# Patient Record
Sex: Female | Born: 1935 | Race: White | Hispanic: No | Marital: Married | Smoking: Never smoker
Health system: Southern US, Community
[De-identification: ages and names within clinical notes are randomized; demographics above are authoritative.]

## PROBLEM LIST (undated history)

## (undated) DIAGNOSIS — E785 Hyperlipidemia, unspecified: Secondary | ICD-10-CM

## (undated) DIAGNOSIS — M1990 Unspecified osteoarthritis, unspecified site: Secondary | ICD-10-CM

## (undated) DIAGNOSIS — J449 Chronic obstructive pulmonary disease, unspecified: Secondary | ICD-10-CM

## (undated) DIAGNOSIS — G43909 Migraine, unspecified, not intractable, without status migrainosus: Secondary | ICD-10-CM

## (undated) DIAGNOSIS — M790 Rheumatism, unspecified: Secondary | ICD-10-CM

## (undated) DIAGNOSIS — A310 Pulmonary mycobacterial infection: Secondary | ICD-10-CM

## (undated) HISTORY — PX: TONSILLECTOMY: SUR1361

## (undated) HISTORY — DX: Rheumatism, unspecified: M79.0

## (undated) HISTORY — DX: Hyperlipidemia, unspecified: E78.5

## (undated) HISTORY — PX: VESICOVAGINAL FISTULA CLOSURE W/ TAH: SUR271

## (undated) HISTORY — PX: CYSTOCELE REPAIR: SHX163

---

## 1998-07-24 ENCOUNTER — Encounter: Payer: Self-pay | Admitting: Pulmonary Disease

## 1998-07-31 ENCOUNTER — Ambulatory Visit (HOSPITAL_COMMUNITY): Admission: RE | Admit: 1998-07-31 | Discharge: 1998-07-31 | Payer: Self-pay | Admitting: Pulmonary Disease

## 1998-07-31 ENCOUNTER — Encounter: Payer: Self-pay | Admitting: Pulmonary Disease

## 1998-08-29 ENCOUNTER — Encounter: Payer: Self-pay | Admitting: Pulmonary Disease

## 1999-01-08 ENCOUNTER — Encounter: Admission: RE | Admit: 1999-01-08 | Discharge: 1999-04-08 | Payer: Self-pay | Admitting: Orthopedic Surgery

## 1999-03-19 ENCOUNTER — Encounter: Payer: Self-pay | Admitting: Pulmonary Disease

## 1999-03-19 ENCOUNTER — Encounter: Admission: RE | Admit: 1999-03-19 | Discharge: 1999-03-19 | Payer: Self-pay | Admitting: Pulmonary Disease

## 1999-03-25 ENCOUNTER — Encounter: Payer: Self-pay | Admitting: Pulmonary Disease

## 1999-05-01 ENCOUNTER — Other Ambulatory Visit: Admission: RE | Admit: 1999-05-01 | Discharge: 1999-05-01 | Payer: Self-pay | Admitting: Obstetrics and Gynecology

## 1999-06-15 ENCOUNTER — Encounter (INDEPENDENT_AMBULATORY_CARE_PROVIDER_SITE_OTHER): Payer: Self-pay

## 1999-06-15 ENCOUNTER — Inpatient Hospital Stay (HOSPITAL_COMMUNITY): Admission: RE | Admit: 1999-06-15 | Discharge: 1999-06-17 | Payer: Self-pay | Admitting: Obstetrics and Gynecology

## 1999-08-24 ENCOUNTER — Encounter: Payer: Self-pay | Admitting: Pulmonary Disease

## 2000-08-01 ENCOUNTER — Encounter: Admission: RE | Admit: 2000-08-01 | Discharge: 2000-08-01 | Payer: Self-pay | Admitting: Geriatric Medicine

## 2000-08-01 ENCOUNTER — Encounter: Payer: Self-pay | Admitting: Geriatric Medicine

## 2000-08-09 ENCOUNTER — Ambulatory Visit (HOSPITAL_COMMUNITY): Admission: RE | Admit: 2000-08-09 | Discharge: 2000-08-09 | Payer: Self-pay | Admitting: Geriatric Medicine

## 2001-06-05 ENCOUNTER — Ambulatory Visit (HOSPITAL_COMMUNITY): Admission: RE | Admit: 2001-06-05 | Discharge: 2001-06-05 | Payer: Self-pay | Admitting: Gastroenterology

## 2001-08-11 ENCOUNTER — Ambulatory Visit (HOSPITAL_COMMUNITY): Admission: RE | Admit: 2001-08-11 | Discharge: 2001-08-11 | Payer: Self-pay | Admitting: Geriatric Medicine

## 2001-09-13 ENCOUNTER — Ambulatory Visit: Admission: RE | Admit: 2001-09-13 | Discharge: 2001-09-13 | Payer: Self-pay | Admitting: Pulmonary Disease

## 2002-08-06 ENCOUNTER — Other Ambulatory Visit: Admission: RE | Admit: 2002-08-06 | Discharge: 2002-08-06 | Payer: Self-pay | Admitting: Geriatric Medicine

## 2002-09-27 ENCOUNTER — Encounter: Admission: RE | Admit: 2002-09-27 | Discharge: 2002-09-27 | Payer: Self-pay | Admitting: Geriatric Medicine

## 2002-09-27 ENCOUNTER — Encounter: Payer: Self-pay | Admitting: Pulmonary Disease

## 2002-09-27 ENCOUNTER — Encounter: Payer: Self-pay | Admitting: Geriatric Medicine

## 2002-10-05 ENCOUNTER — Encounter (INDEPENDENT_AMBULATORY_CARE_PROVIDER_SITE_OTHER): Payer: Self-pay | Admitting: Specialist

## 2002-10-05 ENCOUNTER — Ambulatory Visit: Admission: RE | Admit: 2002-10-05 | Discharge: 2002-10-05 | Payer: Self-pay | Admitting: Pulmonary Disease

## 2004-06-01 ENCOUNTER — Ambulatory Visit: Payer: Self-pay | Admitting: Pulmonary Disease

## 2004-12-17 ENCOUNTER — Ambulatory Visit: Payer: Self-pay | Admitting: Pulmonary Disease

## 2005-03-17 ENCOUNTER — Ambulatory Visit: Payer: Self-pay | Admitting: Pulmonary Disease

## 2005-06-17 ENCOUNTER — Ambulatory Visit: Payer: Self-pay | Admitting: Pulmonary Disease

## 2006-03-08 ENCOUNTER — Ambulatory Visit: Payer: Self-pay | Admitting: Pulmonary Disease

## 2006-07-04 ENCOUNTER — Ambulatory Visit: Payer: Self-pay | Admitting: Pulmonary Disease

## 2007-02-21 ENCOUNTER — Ambulatory Visit: Payer: Self-pay | Admitting: Pulmonary Disease

## 2007-03-25 DIAGNOSIS — K449 Diaphragmatic hernia without obstruction or gangrene: Secondary | ICD-10-CM | POA: Insufficient documentation

## 2007-03-25 DIAGNOSIS — E785 Hyperlipidemia, unspecified: Secondary | ICD-10-CM

## 2007-03-25 DIAGNOSIS — J471 Bronchiectasis with (acute) exacerbation: Secondary | ICD-10-CM | POA: Insufficient documentation

## 2007-06-09 ENCOUNTER — Ambulatory Visit: Payer: Self-pay | Admitting: Pulmonary Disease

## 2007-08-25 ENCOUNTER — Encounter: Admission: RE | Admit: 2007-08-25 | Discharge: 2007-08-25 | Payer: Self-pay | Admitting: Geriatric Medicine

## 2008-03-27 ENCOUNTER — Encounter: Admission: RE | Admit: 2008-03-27 | Discharge: 2008-03-27 | Payer: Self-pay | Admitting: Otolaryngology

## 2008-06-07 ENCOUNTER — Ambulatory Visit: Payer: Self-pay | Admitting: Pulmonary Disease

## 2008-06-07 DIAGNOSIS — J329 Chronic sinusitis, unspecified: Secondary | ICD-10-CM | POA: Insufficient documentation

## 2008-06-07 DIAGNOSIS — J309 Allergic rhinitis, unspecified: Secondary | ICD-10-CM | POA: Insufficient documentation

## 2008-06-07 DIAGNOSIS — A310 Pulmonary mycobacterial infection: Secondary | ICD-10-CM

## 2008-06-07 DIAGNOSIS — J449 Chronic obstructive pulmonary disease, unspecified: Secondary | ICD-10-CM

## 2008-10-09 ENCOUNTER — Ambulatory Visit: Payer: Self-pay | Admitting: Pulmonary Disease

## 2008-10-15 ENCOUNTER — Ambulatory Visit: Payer: Self-pay | Admitting: Pulmonary Disease

## 2008-10-15 ENCOUNTER — Ambulatory Visit: Payer: Self-pay | Admitting: Internal Medicine

## 2008-10-15 ENCOUNTER — Telehealth (INDEPENDENT_AMBULATORY_CARE_PROVIDER_SITE_OTHER): Payer: Self-pay | Admitting: *Deleted

## 2008-10-15 DIAGNOSIS — R042 Hemoptysis: Secondary | ICD-10-CM | POA: Insufficient documentation

## 2008-10-18 ENCOUNTER — Telehealth (INDEPENDENT_AMBULATORY_CARE_PROVIDER_SITE_OTHER): Payer: Self-pay | Admitting: *Deleted

## 2008-10-24 ENCOUNTER — Ambulatory Visit: Payer: Self-pay | Admitting: Pulmonary Disease

## 2009-02-21 ENCOUNTER — Ambulatory Visit: Payer: Self-pay | Admitting: Pulmonary Disease

## 2009-08-29 ENCOUNTER — Ambulatory Visit: Payer: Self-pay | Admitting: Pulmonary Disease

## 2010-02-25 ENCOUNTER — Ambulatory Visit: Payer: Self-pay | Admitting: Pulmonary Disease

## 2010-03-10 ENCOUNTER — Telehealth (INDEPENDENT_AMBULATORY_CARE_PROVIDER_SITE_OTHER): Payer: Self-pay | Admitting: *Deleted

## 2010-03-10 ENCOUNTER — Ambulatory Visit: Payer: Self-pay | Admitting: Internal Medicine

## 2010-03-30 ENCOUNTER — Telehealth (INDEPENDENT_AMBULATORY_CARE_PROVIDER_SITE_OTHER): Payer: Self-pay | Admitting: *Deleted

## 2010-03-31 ENCOUNTER — Ambulatory Visit: Payer: Self-pay | Admitting: Pulmonary Disease

## 2010-04-01 ENCOUNTER — Encounter: Payer: Self-pay | Admitting: Pulmonary Disease

## 2010-04-01 DIAGNOSIS — R042 Hemoptysis: Secondary | ICD-10-CM | POA: Insufficient documentation

## 2010-04-01 DIAGNOSIS — J159 Unspecified bacterial pneumonia: Secondary | ICD-10-CM | POA: Insufficient documentation

## 2010-04-13 ENCOUNTER — Ambulatory Visit: Payer: Self-pay | Admitting: Pulmonary Disease

## 2010-04-27 ENCOUNTER — Encounter: Payer: Self-pay | Admitting: Pulmonary Disease

## 2010-04-30 ENCOUNTER — Encounter: Payer: Self-pay | Admitting: Pulmonary Disease

## 2010-05-01 ENCOUNTER — Encounter: Payer: Self-pay | Admitting: Pulmonary Disease

## 2010-05-08 ENCOUNTER — Telehealth (INDEPENDENT_AMBULATORY_CARE_PROVIDER_SITE_OTHER): Payer: Self-pay | Admitting: *Deleted

## 2010-06-08 ENCOUNTER — Encounter: Payer: Self-pay | Admitting: Pulmonary Disease

## 2010-06-23 NOTE — Assessment & Plan Note (Signed)
Summary: rov for bronchiectasis and hemoptysis   Visit Type:  Follow-up Primary Provider/Referring Provider:  Dr. Pete Glatter  CC:  2 week f/u. pt states she has a little cough 100% better. pt states phlem is light yellow. pt states her breahting has been "good". pt is a never smoker.  History of Present Illness: the pt comes in today for f/u of her known bronchiectasis.  At the last visit, she was treated with abx for an acute exacerbation complicated by hemoptysis.  She comes in for f/u today where her cough has almost totally resolved.  Her mucus is clearing as well, and has not had any further hemoptysis.  She feels that she is breathing well.  Her sputum culture from last visit showed normal flora, but the afb smears were positive with culture pending.  Current Medications (verified): 1)  Lipitor 10 Mg  Tabs (Atorvastatin Calcium) .... Take 1 Tablet By Mouth Once A Day 2)  Anti-Oxidant   Tabs (Multiple Vitamin) .... Once Daily 3)  Calcium Carbonate-Vitamin D 600-400 Mg-Unit  Tabs (Calcium Carbonate-Vitamin D) .... Take 1 Tablet By Mouth Two Times A Day 4)  Vitamin C 500 Mg  Tabs (Ascorbic Acid) .... Take 1 Tablet By Mouth Once A Day 5)  Proair Hfa 108 (90 Base) Mcg/act  Aers (Albuterol Sulfate) .... 2 Puffs Every 4-6 Hours As Needed 6)  Mirtazapine 15 Mg Tabs (Mirtazapine) .... Take 1 Tab By Mouth At Bedtime 7)  Flutter Valve .... Use As Directed 8)  Actonel (? Strength) .Marland Kitchen.. 1 Every Week  Allergies (verified): 1)  ! * Tequin  Review of Systems       The patient complains of shortness of breath with activity, shortness of breath at rest, productive cough, chest pain, difficulty swallowing, and sore throat.  The patient denies non-productive cough, coughing up blood, irregular heartbeats, acid heartburn, indigestion, loss of appetite, weight change, abdominal pain, tooth/dental problems, headaches, nasal congestion/difficulty breathing through nose, sneezing, itching, ear ache, anxiety,  depression, hand/feet swelling, joint stiffness or pain, rash, change in color of mucus, and fever.    Vital Signs:  Patient profile:   77 year old female Height:      64 inches Weight:      104 pounds BMI:     17.92 O2 Sat:      91 % on Room air Temp:     98.1 degrees F oral Pulse rate:   90 / minute BP sitting:   108 / 62  (left arm) Cuff size:   regular  Vitals Entered By: Carver Fila (April 13, 2010 2:50 PM)  O2 Flow:  Room air CC: 2 week f/u. pt states she has a little cough 100% better. pt states phlem is light yellow. pt states her breahting has been "good". pt is a never smoker Comments meds and allergies updated Phone number updated Carver Fila  April 13, 2010 2:51 PM    Physical Exam  General:  thin female in nad Nose:  no purulence or discharge noted. Lungs:  a few scattered crackles and pops, no wheezing Heart:  rrr Extremities:  minimal edema, no cyanosis  Neurologic:  alert and oriented, moves all 4.   Impression & Recommendations:  Problem # 1:  BRONCHIECTASIS (ICD-494.0) the pt is doing much better after a longer course of abx.  She is returning to her usual baseline.  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  I do not think this is an active infection.  Will followup sensitivities.  I have asked her to stay active, keep up with nutrition, and to use proair as needed.  Other Orders: Est. Patient Level III (16109)  Patient Instructions: 1)  continue proair as needed. 2)  let me know if cough or mucus worsens. 3)  will contact you once I know the final results of your MAC culture. 4)  followup with me in 4mos

## 2010-06-23 NOTE — Assessment & Plan Note (Signed)
Summary: Pulmonary/ ext ov for bronchiectasis / hemoptysis > levaquin 750   Primary Provider/Referring Provider:  Dr. Pete Glatter  CC:  Hemoptysis worse x 2 days and tightness in chest and slight increase in SOB w/ exertion.Marland Kitchen  History of Present Illness:  4 yowf never smoker followed by Dr Shelle Iron with bronchiectasis with hemoptysis in setting of MAC infection around 2001 and ever since then freq episodes  low grade hemoptysis  March 10, 2010 ov worse than usual hemoptysis  but still just a few tsp at most with slt chest tightness with ex better with proaire and mucus ? slt more yellow also. Pt denies any significant sore throat, dysphagia, itching, sneezing,  nasal congestion or excess secretions,  fever, chills, sweats, unintended wt loss, pleuritic or exertional cp,   orthopnea pnd or leg swelling, Pt also denies any obvious fluctuation in symptoms with weather or environmental change or other alleviating or aggravating factors.       Current Medications (verified): 1)  Lipitor 10 Mg  Tabs (Atorvastatin Calcium) .... Take 1 Tablet By Mouth Once A Day 2)  Fish Oil 1200 Mg Caps (Omega-3 Fatty Acids) .... Take 1 Tablet By Mouth Once A Day 3)  Anti-Oxidant   Tabs (Multiple Vitamin) .... Once Daily 4)  Calcium Carbonate-Vitamin D 600-400 Mg-Unit  Tabs (Calcium Carbonate-Vitamin D) .... Take 1 Tablet By Mouth Two Times A Day 5)  Vitamin C 500 Mg  Tabs (Ascorbic Acid) .... Take 1 Tablet By Mouth Once A Day 6)  Proair Hfa 108 (90 Base) Mcg/act  Aers (Albuterol Sulfate) .... 2 Puffs Every 4-6 Hours As Needed 7)  Mirtazapine 15 Mg Tabs (Mirtazapine) .... Take 1 Tab By Mouth At Bedtime 8)  Flutter Valve .... Use As Directed 9)  Actonel (? Strength) .Marland Kitchen.. 1 Every Week  Allergies (verified): 1)  ! 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    - Last CT chest 03/19/1999  Vital Signs:  Patient profile:   77 year old female Weight:      102 pounds O2 Sat:      92 % on Room air Temp:     98.4 degrees F oral Pulse rate:   94 / minute BP sitting:   112 / 60  (left arm)  Vitals Entered By: Vernie Murders (March 10, 2010 3:59 PM)  O2 Flow:  Room air  Physical Exam  Additional Exam:  GEN: A/Ox3; pleasant , NAD wt 106 > 102  March 10 2010  HEENT:  [place]/AT, , EACs-clear, TMs-wnl, NOSE-pale mucosa THROAT-clear NECK:  Supple w/ fair ROM; no JVD; normal carotid impulses w/o bruits; no thyromegaly or nodules palpated; no lymphadenopathy. RESP  distant bs, mild increase t exp, no wheeze w/in 4 hours of last B2 CARD:  RRR, no m/r/g   GI:   Soft & nt; nml bowel sounds; no organomegaly or masses detected. [place]: Warm bil,  no calf tenderness edema, clubbing, pulses intact Neuro: intact, no focal deficits.    Impression & Recommendations:  Problem # 1:  HEMOPTYSIS (ICD-786.3)  Her updated medication list for this problem includes:    Anti-oxidant Tabs (Multiple vitamin) ..... Once daily    Calcium Carbonate-vitamin D 600-400 Mg-unit Tabs (Calcium carbonate-vitamin d) .Marland Kitchen... Take 1  tablet by mouth two times a day    Vitamin C 500 Mg Tabs (Ascorbic acid) .Marland Kitchen... Take 1 tablet by mouth once a day    Proair Hfa 108 (90 Base) Mcg/act Aers (Albuterol sulfate) .Marland Kitchen... 2 puffs every 4-6 hours as needed    Levaquin 750 Mg Tabs (Levofloxacin) ..... One tablet daily x 5 days  ? needs cyclical levaquin or consideration for re-bronch and chronic rx of atypcal TB if present  Orders: Est. Patient Level IV (45409)  Problem # 2:  BRONCHIECTASIS (ICD-494.0)   DDX of  difficult airways managment all start with A and  include Adherence, Ace Inhibitors, Acid Reflux, Active Sinus Disease, Alpha 1 Antitripsin deficiency, Anxiety masquerading as Airways dz,  ABPA,   allergy(esp in young), Aspiration (esp in elderly), Adverse effects of DPI,  Active smokers, plus one B  = Beta blocker use..    Acid reflux a concern here as symptoms flared on fish oil p started actonel.  See instructions for specific recommendations   Adherence seems good, I spent extra time with the patient today explaining optimal mdi  technique.  This improved from  50-75% p coaching   ? needs advair or symbicort if becomes more saba depdendent > defer to Dr Shelle Iron  Medications Added to Medication List This Visit: 1)  Actonel (? Strength)  .Marland Kitchen.. 1 every week 2)  Levaquin 750 Mg Tabs (Levofloxacin) .... One tablet daily x 5 days  Other Orders: Prescription Created Electronically [phone])  Patient Instructions: 1)  Levaquin 750 mg one daily x 5 days  2)  Fish oil should be avoided when having active respiratory symptoms because of the risk of reflux of oil into respiratory tract which may be more frequent while on actonel (if this continues off of the fish oil there is an IV form of actonel called reclast which would be eligible for reclast).  Prescriptions: LEVAQUIN 750 MG  TABS (LEVOFLOXACIN) One tablet daily x 5 days  #5 x 0   Entered and Authorized by:   Nyoka Cowden MD   Signed by:   Nyoka Cowden MD on 03/10/2010   Method used:   Electronically to        [facility]* (retail)       [address]       [place], [place]  [id number]       Ph: [phone]       Fax: [phone]   RxID:   [id number]

## 2010-06-23 NOTE — Assessment & Plan Note (Signed)
Summary: acute sick visit for bronchiectasis flare, hemoptysis   Primary Provider/Referring Provider:  Dr. Pete Glatter  CC:  pt c/o cough with brown phlem. Pt states she has globs of blood in it through out the day but it is not bright red, pt c/o 100.6 fever yesterday, a little increased sob, occas wheezing, and chest tightness.  pt is never smoker.  History of Present Illness: the pt comes in today for an acute sick visit.  She has known severe bronchiectasis with h/o MAC, and was seen last month by my partner for an acute exacerbation with worsening hemoptysis.  She was treated with a short course of levaquin with some but not complete improvement.  She comes in today with a worsening hemoptysis, as well as fever to 100.6 yest.  She has noticed some increased sob and chest tightness as well.  Cxr today shows no acute infiltrate, and her underlying chronic changes appear stable.  Current Medications (verified): 1)  Lipitor 10 Mg  Tabs (Atorvastatin Calcium) .... Take 1 Tablet By Mouth Once A Day 2)  Anti-Oxidant   Tabs (Multiple Vitamin) .... Once Daily 3)  Calcium Carbonate-Vitamin D 600-400 Mg-Unit  Tabs (Calcium Carbonate-Vitamin D) .... Take 1 Tablet By Mouth Two Times A Day 4)  Vitamin C 500 Mg  Tabs (Ascorbic Acid) .... Take 1 Tablet By Mouth Once A Day 5)  Proair Hfa 108 (90 Base) Mcg/act  Aers (Albuterol Sulfate) .... 2 Puffs Every 4-6 Hours As Needed 6)  Mirtazapine 15 Mg Tabs (Mirtazapine) .... Take 1 Tab By Mouth At Bedtime 7)  Flutter Valve .... Use As Directed 8)  Actonel (? Strength) .Marland Kitchen.. 1 Every Week  Allergies (verified): 1)  ! * Tequin  Past History:  Past medical, surgical, family and social histories (including risk factors) reviewed, and no changes noted (except as noted below).  Past Medical History: Allergic Rhinitis     - Sinus CT 03/27/2008  complete opac L max sinus, mild thickening elsewhere Hyperlipidemia chronic sinusitis Bronchiectasis, radiographic  diagnosis by ct, with recurrent hemoptysis     - s/p  MAC infection 2000 rx x one year    - Last CT chest 03/19/1999  Past Surgical History: Reviewed history from 06/07/2008 and no changes required. tonsilectomy cystocele urgery hysterectomy  Family History: Reviewed history from 06/07/2008 and no changes required. emphysema-father asthma-father heart disease- father blood clots-mother rheumatism-self cancer-aunt breast   Social History: Reviewed history from 06/07/2008 and no changes required. retired married and lives with husband children no pets occupation- arts and crafts  Review of Systems       The patient complains of shortness of breath with activity, shortness of breath at rest, productive cough, coughing up blood, chest pain, loss of appetite, difficulty swallowing, change in color of mucus, and fever.  The patient denies non-productive cough, irregular heartbeats, acid heartburn, indigestion, weight change, abdominal pain, sore throat, tooth/dental problems, headaches, nasal congestion/difficulty breathing through nose, sneezing, itching, ear ache, anxiety, depression, hand/feet swelling, joint stiffness or pain, and rash.    Vital Signs:  Patient profile:   77 year old female Height:      64 inches Weight:      105.50 pounds O2 Sat:      91 % on Room air Temp:     98.0 degrees F oral Pulse rate:   98 / minute BP sitting:   110 / 68  (right arm) Cuff size:   regular  Vitals Entered By: Carver Fila (March 31, 2010 12:12 PM)  O2 Flow:  Room air CC: pt c/o cough with brown phlem. Pt states she has globs of blood in it through out the day but it is not bright red, pt c/o 100.6 fever yesterday, a little increased sob, occas wheezing, chest tightness.  pt is never smoker Comments meds and allergies updated Phone number updated Carver Fila  March 31, 2010 12:11 PM    Physical Exam  General:  frail appearing female in nad Nose:  no purulence or blood  staining noted. Mouth:  clear, no source of hemoptysis Lungs:  bilat crackles and pops, no wheezing Heart:  rrr Extremities:  no edema or cyanosis  Neurologic:  alert and oriented, moves all 4.   Impression & Recommendations:  Problem # 1:  UNSPECIFIED BACTERIAL PNEUMONIA (ICD-482.9) the pt has known bronchiectasis, and her current symptoms are either due to a severe acute flare vs. PNA.  Will be treated the same either way.  I suspect 5 days of levaquin is not enough given her poor blood flow thru the areas of bronchiectasis, and will therefore treat again for 10 days.  The other issues is that we do not have a recent culture to know her colonizing flora, and what abx may be best suited for her.  Will check culture today, including AFB to make sure MAC has not returned.    Problem # 2:  HEMOPTYSIS (ICD-786.3) This is not unexpected given her degree of bronchiectasis and probable acute flare.  At least it is in very small quantities by her description, and not bright red at this point.  As stated above, will recheck her sputum for recurrence of her MAC.  Medications Added to Medication List This Visit: 1)  Levaquin 750 Mg Tabs (Levofloxacin) .... One tablet by mouth daily  Other Orders: T-Culture, Sputum & Gram Stain ([id number]) T-Culture & Smear AFB ([id number]) Est. Patient Level IV (29562)  Patient Instructions: 1)  will try and get sputum specimen to send for culture 2)  will treat with levaquin 750mg  one each day for 10 days 3)  please call if you are not getting better. 4)  followup with me in 2 weeks.  Prescriptions: LEVAQUIN 750 MG  TABS (LEVOFLOXACIN) One tablet by mouth daily  #10 x 0   Entered and Authorized by:   Barbaraann Share MD   Signed by:   Barbaraann Share MD on 03/31/2010   Method used:   Print then Give to Patient   RxID:   [id number]

## 2010-06-23 NOTE — Assessment & Plan Note (Signed)
Summary: rov for bronchiectasis   Primary Provider/Referring Provider:  Dr. Pete Glatter  CC:  Pt is here for a 6 month f/u appt.  Pt c/o increased coughing with thick clear sputum but states using flutter helps.   Pt also c/o increased sob with exertion. Destiny Rodriguez  History of Present Illness: the pt comes in today for f/u of her known bronchiectasis.  She has been doing very well, and feels the flutter valve has helped when she has congestion or rattling.  She has had no recent acute flares.  She does note doe with her daily walks at time, and has known mild obstructive lung disease.  She has not used her proair at all.  She has not had any further hemoptysis.  Current Medications (verified): 1)  Lipitor 10 Mg  Tabs (Atorvastatin Calcium) .... Take 1 Tablet By Mouth Once A Day 2)  Fish Oil 1200 Mg Caps (Omega-3 Fatty Acids) .... Take 1 Tablet By Mouth Once A Day 3)  Anti-Oxidant   Tabs (Multiple Vitamin) .... Once Daily 4)  Calcium Carbonate-Vitamin D 600-400 Mg-Unit  Tabs (Calcium Carbonate-Vitamin D) .... Take 1 Tablet By Mouth Two Times A Day 5)  Vitamin C 500 Mg  Tabs (Ascorbic Acid) .... Take 1 Tablet By Mouth Once A Day 6)  Proair Hfa 108 (90 Base) Mcg/act  Aers (Albuterol Sulfate) .... 2 Puffs Every 4-6 Hours As Needed 7)  Mirtazapine 15 Mg Tabs (Mirtazapine) .... Take 1 Tab By Mouth At Bedtime 8)  Flutter Valve .... Use As Directed  Allergies (verified): 1)  ! * Tequin  Review of Systems      See HPI  Vital Signs:  Patient profile:   77 year old female Height:      64 inches Weight:      107.50 pounds BMI:     18.52 O2 Sat:      93 % on Room air Temp:     97.6 degrees F oral Pulse rate:   91 / minute BP sitting:   102 / 66  (left arm) Cuff size:   regular  Vitals Entered By: Arman Filter LPN (August 29, 1608 10:39 AM)  O2 Flow:  Room air CC: Pt is here for a 6 month f/u appt.  Pt c/o increased coughing with thick clear sputum but states using flutter helps.   Pt also c/o  increased sob with exertion.  Comments Medications reviewed with patient Arman Filter LPN  August 30, 9602 10:41 AM    Physical Exam  General:  thin female in nad Lungs:  minimal basilar crackles, good airflow, no wheezing Heart:  rrr Extremities:  no edema or cyanosis   Impression & Recommendations:  Problem # 1:  BRONCHIECTASIS (ICD-494.0)  the pt is doing well currently.  She has no significant congestion, cough, or mucus.  I have asked her to continue with the flutter valve.  She is having doe with her walks, and I have asked her to try taking her proair about before doing so.  If she sees great benefit, and requires the inhaler more than twice a day, may want to consider a LABA.  Medications Added to Medication List This Visit: 1)  Flutter Valve  .... Use as directed  Other Orders: Est. Patient Level III (54098)  Patient Instructions: 1)  continue with flutter valve 2)  try using proair 2 puffs 20-55min prior to going for your walks to see if you do better.  Let me know if  you are using proair more than 2 times a day and we can consider a longer acting medication. 3)  followup with me in 6mos.

## 2010-06-23 NOTE — Assessment & Plan Note (Signed)
Summary: rov for bronchiectasis   Visit Type:  Follow-up Primary Provider/Referring Provider:  Dr. Pete Glatter  CC:  6 month follow up. Pt states her breathing is doing "pretty good" as long as if she isn't doing a lot of activity. Pt c/o productive cough with clear phlem. Destiny Rodriguez  History of Present Illness: the pt comes in today for f/u of her known bronchiectasis.  She has been doing well overall, but will get winded with heavier exertional activities.  She uses albuterol only as needed, and has been using the flutter valve to help with intermittant congestion.  She has not had purulent mucus, but does have a small "string of blood" in mucus at times.  She denies congestion or recent acute exacerbation.    Current Medications (verified): 1)  Lipitor 10 Mg  Tabs (Atorvastatin Calcium) .... Take 1 Tablet By Mouth Once A Day 2)  Fish Oil 1200 Mg Caps (Omega-3 Fatty Acids) .... Take 1 Tablet By Mouth Once A Day 3)  Anti-Oxidant   Tabs (Multiple Vitamin) .... Once Daily 4)  Calcium Carbonate-Vitamin D 600-400 Mg-Unit  Tabs (Calcium Carbonate-Vitamin D) .... Take 1 Tablet By Mouth Two Times A Day 5)  Vitamin C 500 Mg  Tabs (Ascorbic Acid) .... Take 1 Tablet By Mouth Once A Day 6)  Proair Hfa 108 (90 Base) Mcg/act  Aers (Albuterol Sulfate) .... 2 Puffs Every 4-6 Hours As Needed 7)  Mirtazapine 15 Mg Tabs (Mirtazapine) .... Take 1 Tab By Mouth At Bedtime 8)  Flutter Valve .... Use As Directed  Allergies (verified): 1)  ! * Tequin  Review of Systems       The patient complains of shortness of breath with activity, productive cough, coughing up blood, acid heartburn, loss of appetite, difficulty swallowing, sneezing, and joint stiffness or pain.  The patient denies shortness of breath at rest, non-productive cough, chest pain, irregular heartbeats, indigestion, weight change, abdominal pain, sore throat, tooth/dental problems, headaches, nasal congestion/difficulty breathing through nose, itching, ear  ache, anxiety, depression, hand/feet swelling, rash, change in color of mucus, and fever.    Vital Signs:  Patient profile:   77 year old female Height:      64 inches Weight:      106.25 pounds BMI:     18.30 O2 Sat:      93 % on Room air Temp:     97.7 degrees F oral Pulse rate:   87 / minute BP sitting:   100 / 66  (right arm) Cuff size:   regular  Vitals Entered By: Carver Fila (February 25, 2010 10:39 AM)  O2 Flow:  Room air CC: 6 month follow up. Pt states her breathing is doing "pretty good" as long as if she isn't doing a lot of activity. Pt c/o productive cough with clear phlem.  Comments meds and allergies updated Phone number updated Carver Fila  February 25, 2010 10:41 AM    Physical Exam  General:  thin, frail female in nad Nose:  mild blood crusting noted in right nare, left clear. Mouth:  no lesions, exudates, or bleeding noted. Lungs:  minimal basilar crackles, no wheezing  Heart:  rrr, no mrg Extremities:  no edema or cyanosis  Neurologic:  alert and oriented, moves all 4.   Impression & Recommendations:  Problem # 1:  BRONCHIECTASIS (ICD-494.0) the pt appears to be at her usual baseline, with no recent bronchiectasis flares.  She has not had a recent cxr, and will check one  today.  I have asked her to continue with the flutter valve, and to use albuterol as needed.  If she is seeing worsening of her exertional tolerance, or if she is requiring more frequent use of albuterol, would consider putting her on a maintenance bronchodilator regimen.    Other Orders: Est. Patient Level III (16109) Admin 1st Vaccine (60454) Flu Vaccine 36yrs + (09811) T-2 View CXR (71020TC)  Patient Instructions: 1)  will check cxr today (last was 09/2008) 2)  will give flu shot today 3)  continue with flutter valve for mucus clearance as needed. 4)  continue albuterol as needed, but if you have progressive symptoms or are requiring albuterol more frequently, let us know. 5)   followup with me in 6mos or sooner if having problems.           Flu Vaccine Consent Questions     Do you have a history of severe allergic reactions to this vaccine? no    Any prior history of allergic reactions to egg and/or gelatin? no    Do you have a sensitivity to the preservative Thimersol? no    Do you have a past history of Guillan-Barre Syndrome? no    Do you currently have an acute febrile illness? no    Have you ever had a severe reaction to latex? no    Vaccine information given and explained to patient? yes    Are you currently pregnant? no    Lot Number:[id number]   Exp Date:11/21/2010   Site Given  Right Deltoid IMlu

## 2010-06-23 NOTE — Progress Notes (Signed)
Summary: hemoptysis, fever  Phone Note Call from Patient   Caller: Patient Call For: clance Summary of Call: pt have fever and cough need to talk to nurse Initial call taken by: Rickard Patience,  March 30, 2010 2:36 PM  Follow-up for Phone Call        called, spoke with pt.  She was in on 10.18.11 to see MW as acc visit for hemoptysis.  States she finished the 5 days of levaquin MW prescribed but has a "terrible cough" that will not go away.  Also coughing up "more pheglm" and at times it has blood in it.  Blood "flucuates from dark red and light red" and has "clumps at times and streaks at times."  Pt states she took her temp for the first time today "because I felt so bad" and it was 100.6.  OV scheduled with KC for tomorrow at 12pm --  pt aware and will go to ED/call 911 if sxs worsen prior to OV. Will forward message to [facility] as FYI. Follow-up by: Gweneth Dimitri RN,  March 30, 2010 3:13 PM  Additional Follow-up for Phone Call Additional follow up Details #1::        noted, pt needs to have cxr prior to my visit.  please call her and tell her to come early to get this done since it is the last apptm of the morning. Additional Follow-up by: Barbaraann Share MD,  March 30, 2010 4:55 PM    Additional Follow-up for Phone Call Additional follow up Details #2::    Spoke with pt and she is aware of CXR, will go to radiology first and then come for appt with Dr. Shelle Iron at 12:00 03/31/10 Armita Galloway RCP, LPN  March 30, 2010 5:12 PM

## 2010-06-23 NOTE — Progress Notes (Signed)
Summary: coughing up blood---appt with MW today at 4pm  Phone Note Call from Patient Call back at [facility] Phone [phone]   Caller: Patient Call For: clance Summary of Call: Pt c/o coughing up blood for some time now and gotten progressively worse during the past two days, pls advise.//[facility] Initial call taken by: Darletta Moll,  March 10, 2010 1:10 PM  Follow-up for Phone Call        Will route to Dr. Shelle Iron for further evaluation. Follow-up by: Coralyn Helling MD,  March 10, 2010 1:30 PM  Additional Follow-up for Phone Call Additional follow up Details #1::        called and spoke with pt.  pt states she was last seen by [facility] 02/25/2010 and stated she occ had small "strings of blood in mucus at times."  however, pt states she has noticed the blood in her sputum is more frequent and has increased in quantity.  pt states blood is bright red.  pt also c/o increased tightness in chest.  KC out of office this pm.  Pt agreed to come in and be seen.  scheduled pt to see MW today at 4pm.  Arman Filter LPN  March 10, 2010 1:41 PM

## 2010-06-25 NOTE — Progress Notes (Signed)
Summary: returning call  Phone Note Call from Patient Call back at Home Phone [phone]   Caller: Patient Call For: clance Summary of Call: Returning Mindy's call. Initial call taken by: Darletta Moll,  May 08, 2010 11:16 AM  Follow-up for Phone Call        Called, spoke with pt.  She was informed afb culture did grow MAC.  will just follow for now per append from 05/08/10 by [facility].  She verbalized understanding of this and voiced no further questions at this time. Follow-up by: Gweneth Dimitri RN,  May 08, 2010 11:52 AM

## 2010-08-10 ENCOUNTER — Ambulatory Visit: Payer: Self-pay | Admitting: Pulmonary Disease

## 2010-08-17 ENCOUNTER — Ambulatory Visit (INDEPENDENT_AMBULATORY_CARE_PROVIDER_SITE_OTHER): Payer: Medicare Other | Admitting: Pulmonary Disease

## 2010-08-17 ENCOUNTER — Encounter: Payer: Self-pay | Admitting: Pulmonary Disease

## 2010-08-17 DIAGNOSIS — J479 Bronchiectasis, uncomplicated: Secondary | ICD-10-CM

## 2010-08-17 DIAGNOSIS — A310 Pulmonary mycobacterial infection: Secondary | ICD-10-CM

## 2010-08-17 NOTE — Assessment & Plan Note (Signed)
The pt is having persistent cough with scant mucus and intermittant hemoptysis, along with weight loss and poor appetite.  Her MAC may be more of an infection at this point rather than a colonizer.  I have spoken with pt about a trial of meds for her MAC (she has done this before in 2001), but she is unsure if she wants to do this.  Will consider and get back with me.

## 2010-08-17 NOTE — Assessment & Plan Note (Signed)
The pt is not having a true bronchiectasis flare.  She is not congested, no increased quantity of mucus, and no fever.  I have asked her to monitor these.

## 2010-08-17 NOTE — Patient Instructions (Signed)
Consider a trial of medications for your MAC to see if some of your symptoms will improve.  Let me know after discussing with your husband followup with me in 4mos, but call if increased mucus, blood, or congestion.

## 2010-08-17 NOTE — Progress Notes (Signed)
  Subjective:    Patient ID: Destiny Rodriguez, female    DOB: 05/23/1925, 76 y.o.   MRN: [id number]  HPI The pt comes in today for f/u of her know bronchiectasis.  She has had a few flares associated with mild hemoptysis.  Currently has no congestion, but is coughing up small quantities of discolored mucus.  Minimal hemoptysis.  She does note decreased appetite and some weight loss.  Chronic cough is not an issue.  No f/c/s    Review of Systems  Constitutional: Positive for unexpected weight change. Negative for fever and appetite change.  HENT: Positive for rhinorrhea, sneezing and dental problem. Negative for ear pain, congestion, sore throat, trouble swallowing and postnasal drip.   Eyes: Negative for redness.  Respiratory: Positive for cough, chest tightness, shortness of breath and wheezing.   Cardiovascular: Negative for chest pain, palpitations and leg swelling.  Gastrointestinal: Negative for nausea, abdominal pain and diarrhea.  Genitourinary: Negative for dysuria.  Musculoskeletal: Negative for joint swelling.  Skin: Negative for rash.  Neurological: Negative for syncope and headaches.  Hematological: Bruises/bleeds easily.  Psychiatric/Behavioral: Negative for dysphoric mood. The patient is not nervous/anxious.        Objective:   Physical Exam Constitutional:  Well developed, no acute distress  Cardiovascular:  Normal rate, regular rhythm, no rubs or gallops.  No murmurs        Intact distal pulses  Pulmonary :  Normal breath sounds, no stridor or respiratory distress   Crackles left base, no wheezing  Musculoskeletal:  No lower extremity edema noted.  Skin:  No cyanosis noted  Neurologic:  Alert, appropriate, moves all 4 extremities without obvious deficit.         Assessment & Plan:

## 2010-08-24 ENCOUNTER — Telehealth: Payer: Self-pay | Admitting: Pulmonary Disease

## 2010-08-24 NOTE — Telephone Encounter (Signed)
I called and informed pt that [facility] will be back in the office on Thursday. Pt states she is willing to try the trial that [facility] has in mind. Will forward to [facility] for review and plan.

## 2010-08-27 NOTE — Telephone Encounter (Signed)
Pt willing to try the mac meds to see if will help her symptoms  Ok to call in the following:  Clarithromycin 500mg  2 tabs three days a week.Marland KitchenMarland KitchenMarland Kitchen#24, 1 fill Rifampin 600mg  one three days a week.Marland KitchenMarland KitchenMarland Kitchen#12, 1 fill  (or if only 300mg , take 2 three times weekly and alter quantity) Ethambutol 400mg  2 three days a week.Marland KitchenMarland KitchenMarland Kitchen#24, 1 fill  Can stagger so that she does not take all of them on same day of week. Needs hepatic profile in 3 weeks Needs ov with me in 4 weeks.

## 2010-08-31 MED ORDER — RIFAMPIN 300 MG PO CAPS: ORAL_CAPSULE | ORAL | Status: DC

## 2010-08-31 MED ORDER — CLARITHROMYCIN ER 500 MG PO TB24: ORAL_TABLET | ORAL | Status: DC

## 2010-08-31 MED ORDER — ETHAMBUTOL HCL 400 MG PO TABS: ORAL_TABLET | ORAL | Status: DC

## 2010-08-31 NOTE — Telephone Encounter (Signed)
Pt called again- still waiting for callback- recs. [phone]. Tivis Ringer

## 2010-08-31 NOTE — Telephone Encounter (Signed)
Pt is aware of new prescriptions being sent to [facility]. She can alternate days so she does not have to take all meds on the very same day. Pt is scheduled for labs on 09/23/2010 and will see KC the following week on 09/30/2010. She will call if she has any problems or questions prior to appt.

## 2010-09-09 ENCOUNTER — Other Ambulatory Visit: Payer: Self-pay | Admitting: Pulmonary Disease

## 2010-09-09 ENCOUNTER — Telehealth: Payer: Self-pay | Admitting: Pulmonary Disease

## 2010-09-09 NOTE — Telephone Encounter (Signed)
[facility] pharmacy checking on status of refill for pt can be reached at [phone].Darletta Moll

## 2010-09-09 NOTE — Telephone Encounter (Signed)
[name] ° ° °

## 2010-09-23 ENCOUNTER — Other Ambulatory Visit: Payer: Self-pay | Admitting: Pulmonary Disease

## 2010-09-23 ENCOUNTER — Other Ambulatory Visit (INDEPENDENT_AMBULATORY_CARE_PROVIDER_SITE_OTHER): Payer: Medicare Other

## 2010-09-23 DIAGNOSIS — A310 Pulmonary mycobacterial infection: Secondary | ICD-10-CM

## 2010-09-23 LAB — HEPATIC FUNCTION PANEL
ALT: 17 U/L (ref 0–35)
Albumin: 3 g/dL — ABNORMAL LOW (ref 3.5–5.2)
Alkaline Phosphatase: 89 U/L (ref 39–117)
Bilirubin, Direct: 0 mg/dL (ref 0.0–0.3)
Total Protein: 6.8 g/dL (ref 6.0–8.3)

## 2010-09-24 ENCOUNTER — Telehealth: Payer: Self-pay | Admitting: Pulmonary Disease

## 2010-09-24 NOTE — Telephone Encounter (Signed)
Please let pt know bloodwork ok  Spoke w/ pt and advised her of lab results. Pt verbalized understanding and had no questions

## 2010-09-30 ENCOUNTER — Ambulatory Visit (INDEPENDENT_AMBULATORY_CARE_PROVIDER_SITE_OTHER): Payer: Medicare Other | Admitting: Pulmonary Disease

## 2010-09-30 ENCOUNTER — Encounter: Payer: Self-pay | Admitting: Pulmonary Disease

## 2010-09-30 DIAGNOSIS — J479 Bronchiectasis, uncomplicated: Secondary | ICD-10-CM

## 2010-09-30 DIAGNOSIS — A310 Pulmonary mycobacterial infection: Secondary | ICD-10-CM

## 2010-09-30 DIAGNOSIS — J449 Chronic obstructive pulmonary disease, unspecified: Secondary | ICD-10-CM

## 2010-09-30 NOTE — Assessment & Plan Note (Signed)
The pt is using albuterol 3 times a day for her doe.  She has been tried on LABA/ICS in past and has been intolerant of ICS due to upper airway irritation and cough.  Will try her on once a day LABA and see how she does.

## 2010-09-30 NOTE — Assessment & Plan Note (Signed)
The pt is much improved from a cough and mucus standpoint, and feels better as well.  She is tolerating the meds reasonably well, and I have reminded her to take on full stomach.

## 2010-09-30 NOTE — Progress Notes (Signed)
  Subjective:    Patient ID: Destiny Rodriguez, female    DOB: [birth year], 77 y.o.   MRN: [id number]  HPI The pt comes in today for f/u of her known bronchiectasis with MAC.  She has been started on MAC meds again in light of worsening cough, weight loss, and malaise.  She has been taking these without issues so far, and has seen great improvement in her cough.  It has not helped her doe, and she has been using albuterol 3 times a day.  She tells me she has been diagnosed with COPD by spiro, but I see no record of this.  Will pull her chart for documentation.    Review of Systems  Constitutional: Positive for unexpected weight change. Negative for fever.  HENT: Positive for rhinorrhea and sneezing. Negative for ear pain, nosebleeds, congestion, sore throat, trouble swallowing, dental problem, postnasal drip and sinus pressure.   Eyes: Negative for redness and itching.  Respiratory: Positive for cough, chest tightness and shortness of breath. Negative for wheezing.   Cardiovascular: Negative for palpitations and leg swelling.  Gastrointestinal: Positive for nausea and abdominal pain. Negative for vomiting.  Genitourinary: Negative for dysuria.  Musculoskeletal: Negative for joint swelling.  Skin: Negative for rash.  Neurological: Positive for dizziness. Negative for headaches.  Hematological: Does not bruise/bleed easily.  Psychiatric/Behavioral: Negative for dysphoric mood. The patient is not nervous/anxious.        Objective:   Physical Exam Thin female in nad Chest with one isolated inspiratory squeak, no wheezing or rhonchi Cor with rrr.  LE without edema, no cyanosis  Alert and oriented, moves all 4        Assessment & Plan:

## 2010-09-30 NOTE — Patient Instructions (Signed)
Will try arcapta one inhalation each am to see if helps your breathing. Stay on meds for MAC, but make sure you see an Ophthomologist in 3mos and let them know you are on ethambutol. followup with me in 8 weeks.

## 2010-10-09 NOTE — Op Note (Signed)
[facility]  Patient:    Destiny Rodriguez, Destiny Rodriguez Visit Number: [id number] MRN: [id number]          Service Type: Attending:  Verlin Grills, M.D. Dictated by:   Verlin Grills, M.D. Proc. Date: 06/05/01   CC:         Hal T. Stoneking, M.D.   Operative Report  PROCEDURE:  Colonoscopy.  REFERRING PHYSICIAN:  Hal T. Stoneking, M.D.  PROCEDURE INDICATION:  Destiny Rodriguez is a 77 year old female born February 13, 1925. She has gastrointestinal symptoms compatible with irritable bowel syndrome (constipation alternating with diarrhea, sensation of incomplete evacuation, abdominal bloating, and abdominal cramps). On rare occasions she spots blood on the toilet tissue following bowel movements. In April of 1998, her flexible proctosigmoidoscopy revealed colonic diverticulosis. She denied a personal or family history of colon cancer. Her gastrointestinal symptoms are improved on Metamucil.  I discussed with Ms. Pitz the complications associated with colonoscopy and polypectomy, including intestinal bleeding and intestinal perforation. She has now signed the operative permit.  MEDICATION ALLERGIES:  None.  CHRONIC MEDICATIONS:  Calcium, Metamucil, multivitamin, Lipitor.  PAST MEDICAL HISTORY:  Treated Mycobacterium avium Complex, degenerative joint disease of the lumbar spine, irritable bowel syndrome, gastroesophageal reflux associated with a hiatal hernia, elevated cholesterol, cysotocele-rectocele repair, hysterectomy, tonsillectomy.  ENDOSCOPIST:  Verlin Grills, M.D.  PREMEDICATION:  Versed 5 mg.  ENDOSCOPE:  Olympus Pediatric Colonoscope.  DESCRIPTION OF PROCEDURE:  After obtaining informed consent, Ms. Mangini was placed in the left lateral decubitus position. I administered intravenous Versed to achieve conscious sedation for the procedure. The patients blood pressure, oxygen saturation, and cardiac rhythm were monitored  throughout the procedure and documented in the medical record.  Anal inspection was normal. Digital rectal exam was normal. The Olympus Pediatric video colonoscope was introduced into the rectum and easily advanced to the cecum. Colonic preparation for the exam today was satisfactory.  Rectum:  Normal.  Sigmoid colon and descending colon:  Extensive left colonic diverticulosis.  Splenic flexure:  Normal.  Transverse colon:  Normal.  Hepatic flexure:  Normal.  Ascending colon:  Normal.  Cecum and ileocecal valve:  Normal.  ASSESSMENT:  Left colonic diverticulosis; otherwise normal proctocolonoscopy to the cecum. No endoscopic evidence for the presence of colorectal neoplasia. ictated by:   Verlin Grills, M.D. Attending:  Verlin Grills, M.D. DD:  06/05/01 TD:  06/05/01 Job: [id number]

## 2010-10-09 NOTE — Op Note (Signed)
   NAME:  Destiny Rodriguez, EGLI                       ACCOUNT NO.:  [id number]   MEDICAL RECORD NO.:  [id number]                   PATIENT TYPE:  AMB   LOCATION:  CARD                                 FACILITY:  [facility]   PHYSICIAN:  Oley Balm. Sung Amabile, M.D. [facility]          DATE OF BIRTH:  04-23-1925   DATE OF PROCEDURE:  10/05/2002  DATE OF DISCHARGE:  10/05/2002                                 OPERATIVE REPORT   PROCEDURE:  Bronchoscopy.   INDICATIONS FOR PROCEDURE:  1. Multiple cavitary pulmonary nodules.  2. History of pulmonary MAC.   PREMEDICATION:  Versed 4 mg IV, fentanyl 50 mcg IV.   ANESTHESIA:  Topical anesthesia was applied to the nose and throat and 80 mL  of 1% lidocaine were used during the course of the procedure.   DESCRIPTION OF PROCEDURE:  After adequate sedation and anesthesia, the  microscope was introduced via the left naris and advanced down the posterior  pharynx. This demonstrated normal upper airway anatomy. The vocal cords  moved symmetrically. Further anesthesia was achieved with 1% lidocaine and  the scope was advanced into the trachea. Complete airway anesthesia was  achieved with 1% lidocaine and an airway examination was performed. This  demonstrated normal bronchial anatomy. There were mucosal changes consistent  with mild acute bronchitis. There was a small endobronchial nodule in the  airway of the apical segment of the right upper lobe. Otherwise no  endobronchial tumors, masses or foreign bodies were seen. There were scant  white secretions seen throughout. After completion of the airway  examination, washings were obtained from multiple segments corresponding to  the areas of abnormality on CT scan. This included the right upper lobe  anterior and apical segments, lingula, left upper lobe and superior segment  of the left lower lobe. Brushings were obtained from the apical segment of  the right upper lobe and a pinch endobronchial biopsy was performed on  the  endobronchial nodule in the apical segment of the right upper lobe. The  patient tolerated the procedure well. There were no significant  complications. The above specimens were sent for cytology as well as AFB and  fundal studies.                                               Oley Balm Sung Amabile, M.D. [facility]    DBS/MEDQ  D:  10/17/2002  T:  10/17/2002  Job:  [id number]

## 2010-10-22 ENCOUNTER — Telehealth: Payer: Self-pay | Admitting: Pulmonary Disease

## 2010-10-22 ENCOUNTER — Other Ambulatory Visit: Payer: Self-pay | Admitting: Pulmonary Disease

## 2010-10-22 NOTE — Telephone Encounter (Signed)
Called and spoke with pt.  Pt states she last saw KC on 09/30/2010 and was given a sample of Arcapta.  States her breathing has improved since taking the Arcapta and requests an rx be sent to pharmacy.  KC, please advise if ok to send refills or not.  Thanks.

## 2010-10-23 ENCOUNTER — Telehealth: Payer: Self-pay | Admitting: Pulmonary Disease

## 2010-10-23 MED ORDER — FORMOTEROL FUMARATE 12 MCG IN CAPS: 12.0000 ug | ORAL_CAPSULE | Freq: Two times a day (BID) | RESPIRATORY_TRACT | Status: DC

## 2010-10-23 MED ORDER — INDACATEROL MALEATE 75 MCG IN CAPS: 1.0000 | ORAL_CAPSULE | RESPIRATORY_TRACT | Status: DC

## 2010-10-23 NOTE — Telephone Encounter (Signed)
Ok to send in prescription.  Let her know if too expensive, can try on foradil which is very similar med but is twice a day instead of once a day.

## 2010-10-23 NOTE — Telephone Encounter (Signed)
Called and spoke with pt. Pt states Arcapta is too expensive.  Requesting cheaper med.  Per KC, ok to switch to Foradil - take bid.  Rx sent to pharmacy.  Pt aware.

## 2010-10-23 NOTE — Telephone Encounter (Signed)
Sent rx to [facility] and lmom for pt this has been done also lm that if med was to expensive to call [place] back for the alternative of foradil

## 2010-11-04 ENCOUNTER — Telehealth: Payer: Self-pay | Admitting: Pulmonary Disease

## 2010-11-04 NOTE — Telephone Encounter (Signed)
PA initiated for Foradil and Arcapia inhalers. Forms received and given to [facility] so Dr. Shelle Iron may sign once he is back on Mon., 11/09/2010.

## 2010-11-04 NOTE — Telephone Encounter (Signed)
Received prior auth and put in KC's very important look at folder for him to fill out when he returns to the office next week.

## 2010-11-13 NOTE — Telephone Encounter (Signed)
Kc fill out PA form.  Form faxed back to [employer].  Awaiting approval or denial.

## 2010-11-16 NOTE — Telephone Encounter (Signed)
Received fax back from [employer].  Foradil approved through 05/24/2011.

## 2010-11-18 LAB — AFB CULTURE WITH SMEAR (NOT AT ARMC)

## 2010-11-21 ENCOUNTER — Telehealth: Payer: Self-pay | Admitting: Critical Care Medicine

## 2010-11-21 NOTE — Telephone Encounter (Signed)
Pt complains of blood in sputum and dark stools. Pt on Mac  therapy  Pt told to stop ABX and make an ov this week  Call pt on Monday for an appt

## 2010-11-23 ENCOUNTER — Other Ambulatory Visit: Payer: Self-pay | Admitting: Adult Health

## 2010-11-23 ENCOUNTER — Other Ambulatory Visit (INDEPENDENT_AMBULATORY_CARE_PROVIDER_SITE_OTHER): Payer: Medicare Other

## 2010-11-23 ENCOUNTER — Encounter: Payer: Self-pay | Admitting: Adult Health

## 2010-11-23 ENCOUNTER — Ambulatory Visit (INDEPENDENT_AMBULATORY_CARE_PROVIDER_SITE_OTHER)
Admission: RE | Admit: 2010-11-23 | Discharge: 2010-11-23 | Disposition: A | Discharge status: Home / Self Care | Payer: Medicare Other | Source: Ambulatory Visit | Attending: Adult Health | Admitting: Adult Health

## 2010-11-23 ENCOUNTER — Ambulatory Visit (INDEPENDENT_AMBULATORY_CARE_PROVIDER_SITE_OTHER): Payer: Medicare Other | Admitting: Adult Health

## 2010-11-23 DIAGNOSIS — K921 Melena: Secondary | ICD-10-CM

## 2010-11-23 DIAGNOSIS — R042 Hemoptysis: Secondary | ICD-10-CM

## 2010-11-23 DIAGNOSIS — R195 Other fecal abnormalities: Secondary | ICD-10-CM

## 2010-11-23 DIAGNOSIS — A310 Pulmonary mycobacterial infection: Secondary | ICD-10-CM

## 2010-11-23 LAB — CBC WITH DIFFERENTIAL/PLATELET
Basophils Absolute: 0 10*3/uL (ref 0.0–0.1)
HCT: 40.8 % (ref 36.0–46.0)
Lymphs Abs: 1.9 10*3/uL (ref 0.7–4.0)
MCV: 95.5 fl (ref 78.0–100.0)
Monocytes Absolute: 0.8 10*3/uL (ref 0.1–1.0)
Neutrophils Relative %: 65.9 % (ref 43.0–77.0)
Platelets: 204 10*3/uL (ref 150.0–400.0)
RDW: 14.9 % — ABNORMAL HIGH (ref 11.5–14.6)

## 2010-11-23 NOTE — Assessment & Plan Note (Signed)
Case and xray reviewed detail  Pt to restart MAC tx , monitor closely , pt to call back hemoptysis does not resolve or returns.  Xray reviewed appears chronic chnanges without acute process.

## 2010-11-23 NOTE — Assessment & Plan Note (Signed)
?   Etiology-- exam unrevealing with neg occult stool  Will restart MAC abx.  Advised to eat yogurt Twice daily   Avoid nsaids /asa Refer to PCP for monitoring and follow up of colonoscpy if not up to date Cbc pending.

## 2010-11-23 NOTE — Telephone Encounter (Signed)
Called, spoke with pt.  Pt scheduled to see TP today at 2:15pm -- pt aware.

## 2010-11-23 NOTE — Patient Instructions (Signed)
Restart MAC treatment.  May eat yogurt Twice daily   I will call with labs.  Would like for you to make appointment with your family doctor to verify black stools  resolve and your colonoscopy is up to date. follow up Dr. Shelle Iron as planned and As needed

## 2010-11-23 NOTE — Progress Notes (Signed)
Note and cxr reviewed.  Agree with plan as outlined.

## 2010-11-23 NOTE — Progress Notes (Signed)
  Subjective:    Patient ID: Destiny Rodriguez, female    DOB: [birth year], 77 y.o.   MRN: [id number]  HPI 77 yo female with known hx of Bronchiectasis w/ MAC  09/30/10  The pt comes in today for f/u of her known bronchiectasis with MAC.  She has been started on MAC meds again in light of worsening cough, weight loss, and malaise.  She has been taking these without issues so far, and has seen great improvement in her cough.  It has not helped her doe, and she has been using albuterol 3 times a day.  She tells me she has been diagnosed with COPD by spiro, but I see no record of this.  Will pull her chart for documentation.   11/23/2010 Acute OV Complains of prod cough with small amount of bright red mucus and black  x1 on 6.30.12 and 7.1.12. Her abx regimen was held by on call MD. She coughed up scant bright red blood with mucus on 2 separate occasions. She had one episode of black/dark stool . None today.  No abdoimnal pain or fever. No increaesd cough/congestion or edema. No NSAIDS or asa products used. NO urinary symptoms  She is maintained on Biaxin, Rifampin. And ethambutol. She says she had been getting better with decreased cough over last couple of months.   She does not feel bad today. She believe her last colonoscopy is <10 years. NO weight loss.   Review of Systems Constitutional:   No  weight loss, night sweats,  Fevers, chills, fatigue, or  lassitude.  HEENT:   No headaches,  Difficulty swallowing,  Tooth/dental problems, or  Sore throat,                No sneezing, itching, ear ache, nasal congestion, post nasal drip,   CV:  No chest pain,  Orthopnea, PND, swelling in lower extremities, anasarca, dizziness, palpitations, syncope.   GI  No heartburn, indigestion, abdominal pain, nausea, vomiting, diarrhea,    Resp: No shortness of breath with exertion or at rest.      No wheezing.  No chest wall deformity  Skin: no rash or lesions.  GU: no dysuria, change in color of urine, no  urgency or frequency.  No flank pain, no hematuria   MS:  No joint pain or swelling.  No decreased range of motion.  No back pain.  Psych:  No change in mood or affect. No depression or anxiety.  No memory loss.         Objective:   Physical Exam GEN: A/Ox3; pleasant , NAD, elderly thin,   HEENT:  Emily/AT,  EACs-clear, TMs-wnl, NOSE-clear, THROAT-clear, no lesions, no postnasal drip or exudate noted.   NECK:  Supple w/ fair ROM; no JVD; normal carotid impulses w/o bruits; no thyromegaly or nodules palpated; no lymphadenopathy.  RESP  Coarse BS w/ few rhonchi , no accessory muscle use, no dullness to percussion  CARD:  RRR, no m/r/g  , no peripheral edema, pulses intact, no cyanosis or clubbing.  GI:   Soft & nt; nml bowel sounds; no organomegaly or masses detected. Rectal : nml spinchter tone , neg occult stool , empty vault   Musco: Warm bil, no deformities or joint swelling noted.   Neuro: alert, no focal deficits noted.    Skin: Warm, no lesions or rashes         Assessment & Plan:

## 2010-11-30 ENCOUNTER — Ambulatory Visit (INDEPENDENT_AMBULATORY_CARE_PROVIDER_SITE_OTHER): Payer: Medicare Other | Admitting: Pulmonary Disease

## 2010-11-30 ENCOUNTER — Encounter: Payer: Self-pay | Admitting: Pulmonary Disease

## 2010-11-30 DIAGNOSIS — R042 Hemoptysis: Secondary | ICD-10-CM

## 2010-11-30 DIAGNOSIS — A310 Pulmonary mycobacterial infection: Secondary | ICD-10-CM

## 2010-11-30 DIAGNOSIS — J449 Chronic obstructive pulmonary disease, unspecified: Secondary | ICD-10-CM

## 2010-11-30 NOTE — Progress Notes (Signed)
  Subjective:    Patient ID: Destiny Rodriguez, female    DOB: Dec 15, 1924, 77 y.o.   MRN: [id number]  HPI The pt comes in today for f/u of her known bronchiectasis with MAC infection and mild obstructive airways disease.  She has had some recent scant hemoptysis, but has improved.  She is remaining on her MAC meds, and feels her cough and congestion is much better.  She is only using foradil once a day, and has not wanted to be on any kind of aggressive BD regimen due to concerns over med side effects.  She doesn't think foradil has made much difference in her doe.     Review of Systems  Constitutional: Negative for fever and unexpected weight change.  HENT: Positive for congestion, rhinorrhea and postnasal drip. Negative for ear pain, nosebleeds, sore throat, sneezing, trouble swallowing, dental problem and sinus pressure.   Eyes: Negative for redness and itching.  Respiratory: Positive for cough, shortness of breath and wheezing. Negative for chest tightness.   Cardiovascular: Negative for palpitations and leg swelling.  Gastrointestinal: Negative for nausea and vomiting.  Genitourinary: Negative for dysuria.  Musculoskeletal: Negative for joint swelling.  Skin: Negative for rash.  Neurological: Positive for headaches.  Hematological: Does not bruise/bleed easily.  Psychiatric/Behavioral: Negative for dysphoric mood. The patient is not nervous/anxious.        Objective:   Physical Exam Thin female in nad Nares without obvious discharge Chest with decreased bs, a few scattered crackles, no wheezing  Cor with rrr LE without edema, no cyanosis noted.  Alert, oriented, moves all 4        Assessment & Plan:

## 2010-11-30 NOTE — Patient Instructions (Addendum)
Continue with your medications for MAC. You can come off foradil as a trial to see if shortness of breath worsens, and if it does, get back on foradil. followup with me in 3mos

## 2010-12-05 NOTE — Assessment & Plan Note (Signed)
The pt has had subjective improvement on MAC meds clinically, and her cxr has remained stable.  She is to continue on this for a one year course if tolerated.  Will check LFT's again in the near future.

## 2010-12-05 NOTE — Assessment & Plan Note (Signed)
The pt has mild copd by her pfts, but has been hesitant to stay on any type of BD regimen due to her concerns over medications.  I have told her it is ok to stop foradil and see how she responds.

## 2010-12-05 NOTE — Assessment & Plan Note (Signed)
Her quantity of hemoptysis is minimal, and I have told her this is very common in bronchiectasis pts with underlying active infection.  Will monitor for now with stable cxr.

## 2010-12-15 ENCOUNTER — Ambulatory Visit: Payer: Self-pay | Admitting: Pulmonary Disease

## 2011-02-12 ENCOUNTER — Encounter: Payer: Self-pay | Admitting: Pulmonary Disease

## 2011-02-12 ENCOUNTER — Ambulatory Visit (INDEPENDENT_AMBULATORY_CARE_PROVIDER_SITE_OTHER): Payer: Medicare Other | Admitting: Pulmonary Disease

## 2011-02-12 DIAGNOSIS — Z23 Encounter for immunization: Secondary | ICD-10-CM

## 2011-02-12 DIAGNOSIS — J4489 Other specified chronic obstructive pulmonary disease: Secondary | ICD-10-CM

## 2011-02-12 DIAGNOSIS — J479 Bronchiectasis, uncomplicated: Secondary | ICD-10-CM

## 2011-02-12 DIAGNOSIS — A310 Pulmonary mycobacterial infection: Secondary | ICD-10-CM

## 2011-02-12 DIAGNOSIS — J449 Chronic obstructive pulmonary disease, unspecified: Secondary | ICD-10-CM

## 2011-02-12 MED ORDER — LEVOFLOXACIN 750 MG PO TABS: 750.0000 mg | ORAL_TABLET | Freq: Every day | ORAL | Status: AC

## 2011-02-12 NOTE — Patient Instructions (Signed)
Stay on your medications for MAC Will treat with levaquin for 7 days for superimposed bacterial infection in sinuses or chest Let me know if cough and increased mucus doesn't resolve. Will give you a flu shot today.  followup with me in 3mos

## 2011-02-12 NOTE — Assessment & Plan Note (Signed)
The patient denies any significant shortness of breath at this time, and really has not wanted to stay on any type of bronchodilator regimen.

## 2011-02-12 NOTE — Assessment & Plan Note (Signed)
The patient has been on multidrug therapy for her MAC since April of this year.  Her malaise and anorexia are much improved, and she feels that she has done better since being on the medication.  She denies any current side effects from the meds.  We'll check a chest x-ray and liver function studies at next visit.

## 2011-02-12 NOTE — Progress Notes (Signed)
  Subjective:    Patient ID: Destiny Rodriguez, female    DOB: 1925-04-04, 77 y.o.   MRN: [id number]  HPI Patient comes in today for followup of her multiple pulmonary issues.  She has known bronchiectasis with MAC infection/colonization, and currently is on multidrug therapy due to severe constitutional symptoms.  She is much improved since being on this regimen.  She also has COPD associated with her bronchiectasis, but has not wanted to stay on any type of bronchodilator regimen.  She comes in today where she has had increased cough with increased quantity of purulent mucus, as well as minimal hemoptysis.  She denies significant sinus congestion or postnasal drip.  She has not seen worsening of her shortness of breath.  The cough is very bothersome to her.  She is not having any medication reactions to her treatment for MAC, with no evidence for a abdominal pain or jaundice.   Review of Systems  Constitutional: Negative for fever and unexpected weight change.  HENT: Positive for rhinorrhea, sneezing, trouble swallowing and postnasal drip. Negative for ear pain, nosebleeds, congestion, sore throat, dental problem and sinus pressure.   Eyes: Positive for itching. Negative for redness.  Respiratory: Positive for cough and shortness of breath. Negative for chest tightness and wheezing.   Cardiovascular: Negative for palpitations and leg swelling.  Gastrointestinal: Positive for nausea. Negative for vomiting.  Genitourinary: Negative for dysuria.  Musculoskeletal: Negative for joint swelling.  Skin: Negative for rash.  Neurological: Negative for headaches.  Hematological: Does not bruise/bleed easily.  Psychiatric/Behavioral: Positive for dysphoric mood. The patient is not nervous/anxious.        Objective:   Physical Exam Thin female in no acute distress Crusting noted in the right nare, otherwise no purulence or discharge seen Oropharynx clear Chest with scattered crackles, a few rhonchi,  but good airflow Cardiac regular rate and rhythm Lower extremities with no edema, no cyanosis noted Alert and oriented, moves all 4 extremities.       Assessment & Plan:

## 2011-02-12 NOTE — Assessment & Plan Note (Signed)
I suspect the patient's increased cough and mucus production is due to a bronchiectasis flare.  More than likely this is a bacterial and action, and we'll not be covered adequately by her medications for MAC.  I will treat her with a short course of Levaquin to see if she has improvement.  We also need to keep in mind that she has chronic sinusitis.

## 2011-02-24 ENCOUNTER — Ambulatory Visit: Payer: Medicare Other | Admitting: Pulmonary Disease

## 2011-03-01 ENCOUNTER — Ambulatory Visit: Payer: Medicare Other | Admitting: Pulmonary Disease

## 2011-04-29 ENCOUNTER — Ambulatory Visit (INDEPENDENT_AMBULATORY_CARE_PROVIDER_SITE_OTHER): Payer: Medicare Other | Admitting: Pulmonary Disease

## 2011-04-29 ENCOUNTER — Encounter: Payer: Self-pay | Admitting: Pulmonary Disease

## 2011-04-29 DIAGNOSIS — A310 Pulmonary mycobacterial infection: Secondary | ICD-10-CM

## 2011-04-29 DIAGNOSIS — J479 Bronchiectasis, uncomplicated: Secondary | ICD-10-CM

## 2011-04-29 DIAGNOSIS — R042 Hemoptysis: Secondary | ICD-10-CM

## 2011-04-29 DIAGNOSIS — J449 Chronic obstructive pulmonary disease, unspecified: Secondary | ICD-10-CM

## 2011-04-29 NOTE — Assessment & Plan Note (Signed)
The patient has been using her rescue inhaler more frequently since the last visit, and I have asked her again to consider a long acting maintenance inhaler in its place.  She will try going back on the Foradil for a period of time.

## 2011-04-29 NOTE — Patient Instructions (Addendum)
Will check ct scan of your chest, and call you with results.  Will decide about further antibiotics, and whether to continue your treatment for MAC. If you are using proair 2-3 times a day, you should consider going back on your long acting maintenance medication.  Try foradil one inhalation in am and pm to see if this helps Use flutter valve am and pm for next 3-4 weeks consistently to see if helps with mucus and cough.

## 2011-04-29 NOTE — Progress Notes (Signed)
  Subjective:    Patient ID: Destiny Rodriguez, female    DOB: Dec 28, 1924, 77 y.o.   MRN: [id number]  HPI The patient comes in today for followup of her known bronchiectasis, MAC infection, and intermittent hemoptysis.  She has been on a 3 drug regimen for her max since April of this year, and has definitely seen improvement in her constitutional symptoms.  However, she is not seeing improvement in her cough or intermittent episodes of hemoptysis.  Since the last visit, she has had for self limiting episodes, with the last producing approximately 2 tablespoons of bright red blood.  This typically has occurred at night, but she denies any episodes of epistaxis.  She has not had increased quantity of mucus or change in care her of her mucus.  She denies any fevers, chills, or sweats.  She has had some increased shortness of breath, and has been using her rescue inhaler 2-3 times a day.  She has not wanted to stay on a long acting bronchodilator.   Review of Systems  Constitutional: Negative for fever and unexpected weight change.  HENT: Positive for rhinorrhea and sneezing. Negative for ear pain, nosebleeds, congestion, sore throat, trouble swallowing, dental problem, postnasal drip and sinus pressure.   Eyes: Negative for redness and itching.  Respiratory: Positive for cough, chest tightness, shortness of breath and wheezing.   Cardiovascular: Negative for palpitations and leg swelling.  Gastrointestinal: Negative for nausea and vomiting.  Genitourinary: Negative for dysuria.  Musculoskeletal: Negative for joint swelling.  Skin: Negative for rash.  Neurological: Negative for headaches.  Hematological: Does not bruise/bleed easily.  Psychiatric/Behavioral: Positive for dysphoric mood. The patient is not nervous/anxious.        Objective:   Physical Exam Thin female in no acute distress Nose without purulence or discharge noted Chest with scattered crackles, no wheezes or rhonchi Cardiac exam  with regular rate and rhythm Lower extremities without edema, no cyanosis noted Alert and oriented, moves all 4 extremities.       Assessment & Plan:

## 2011-04-29 NOTE — Assessment & Plan Note (Signed)
The pt feels her constitutional symptoms are better since being on the medications, but unfortunately has not had an effect on cough or hemoptysis.  She has been on treatment for 8mos now, and need to make a decision whether we need to continue this.  Will decide after seeing ct chest.

## 2011-04-29 NOTE — Assessment & Plan Note (Signed)
The patient has known bronchiectasis, but is really not bringing up any more quantity of mucus than her baseline.  She also denies it being more purulent.  This would tend to go against a bronchiectasis flare being the etiology of her current hemoptysis.  We'll check a CT scan of her chest to rule out complicating factors, and make a decision about antibiotics depending upon the results.

## 2011-04-29 NOTE — Assessment & Plan Note (Signed)
The patient has had 4 episodes of hemoptysis since the last visit, but most of them have been very small quantities.  She did have one episode of about 2 tablespoons worth.  This is primarily occurring at night, and it raises the question if she may have a posterior nosebleed that is contributing to this issue.  Because of her multiple underlying pulmonary issues, and in fact she continues to have hemoptysis, will check CT chest.  This will help rule out a fungus ball, lung abscess, or a worsening area of bronchiectasis that may be contributing to this.

## 2011-05-05 ENCOUNTER — Ambulatory Visit (INDEPENDENT_AMBULATORY_CARE_PROVIDER_SITE_OTHER)
Admission: RE | Admit: 2011-05-05 | Discharge: 2011-05-05 | Disposition: A | Discharge status: Home / Self Care | Payer: Medicare Other | Source: Ambulatory Visit | Attending: Pulmonary Disease | Admitting: Pulmonary Disease

## 2011-05-05 DIAGNOSIS — J479 Bronchiectasis, uncomplicated: Secondary | ICD-10-CM

## 2011-05-05 DIAGNOSIS — R042 Hemoptysis: Secondary | ICD-10-CM

## 2011-05-05 DIAGNOSIS — A310 Pulmonary mycobacterial infection: Secondary | ICD-10-CM

## 2011-05-10 ENCOUNTER — Other Ambulatory Visit: Payer: Self-pay | Admitting: Pulmonary Disease

## 2011-05-10 DIAGNOSIS — A310 Pulmonary mycobacterial infection: Secondary | ICD-10-CM

## 2011-05-12 ENCOUNTER — Ambulatory Visit: Payer: Medicare Other | Admitting: Pulmonary Disease

## 2011-05-12 ENCOUNTER — Telehealth: Payer: Self-pay | Admitting: Pulmonary Disease

## 2011-05-12 NOTE — Telephone Encounter (Signed)
I spoke with pt and she wanted to know if she needed to get to Dr. Feliz Beam office early. I advised pt 10-15 minutes early incase they have paperwork for her to fill out.. Pt voiced her understanding and needed nothing further

## 2011-05-13 ENCOUNTER — Encounter: Payer: Self-pay | Admitting: Internal Medicine

## 2011-05-13 ENCOUNTER — Other Ambulatory Visit: Payer: Self-pay | Admitting: Internal Medicine

## 2011-05-13 ENCOUNTER — Ambulatory Visit (INDEPENDENT_AMBULATORY_CARE_PROVIDER_SITE_OTHER): Payer: Medicare Other | Admitting: Internal Medicine

## 2011-05-13 VITALS — BP 136/75 | HR 93 | Temp 97.8°F | Ht 64.0 in | Wt 94.0 lb

## 2011-05-13 DIAGNOSIS — A310 Pulmonary mycobacterial infection: Secondary | ICD-10-CM

## 2011-05-13 DIAGNOSIS — R042 Hemoptysis: Secondary | ICD-10-CM

## 2011-05-13 DIAGNOSIS — A318 Other mycobacterial infections: Secondary | ICD-10-CM

## 2011-05-13 DIAGNOSIS — J111 Influenza due to unidentified influenza virus with other respiratory manifestations: Secondary | ICD-10-CM

## 2011-05-13 LAB — CBC WITH DIFFERENTIAL/PLATELET
Basophils Absolute: 0 10*3/uL (ref 0.0–0.1)
Lymphocytes Relative: 13 % (ref 12–46)
Neutro Abs: 5.5 10*3/uL (ref 1.7–7.7)
Neutrophils Relative %: 73 % (ref 43–77)
Platelets: 182 10*3/uL (ref 150–400)
RDW: 13.6 % (ref 11.5–15.5)
WBC: 7.6 10*3/uL (ref 4.0–10.5)

## 2011-05-13 MED ORDER — GUAIFENESIN-DM 100-10 MG/5ML PO SYRP: 5.0000 mL | ORAL_SOLUTION | Freq: Three times a day (TID) | ORAL | Status: AC | PRN

## 2011-05-13 MED ORDER — OSELTAMIVIR PHOSPHATE 75 MG PO CAPS
75.0000 mg | ORAL_CAPSULE | Freq: Two times a day (BID) | ORAL | Status: AC
Start: 2011-05-13 — End: 2011-05-23

## 2011-05-13 MED ORDER — BENZONATATE 100 MG PO CAPS: 200.0000 mg | ORAL_CAPSULE | Freq: Three times a day (TID) | ORAL | Status: DC | PRN

## 2011-05-16 LAB — RESPIRATORY CULTURE OR RESPIRATORY AND SPUTUM CULTURE

## 2011-05-16 LAB — RESPIRATORY VIRUS PANEL
Adenovirus E: NOT DETECTED
Bocavirus: NOT DETECTED
Coronavirus229E: NOT DETECTED
CoronavirusNL63: NOT DETECTED
CoronavirusOC43: NOT DETECTED
Coxsackie and Echovirus: NOT DETECTED
Influenza A: DETECTED — AB
Metapneumovirus: NOT DETECTED
Parainfluenza 1: NOT DETECTED
Parainfluenza 2: NOT DETECTED
Parainfluenza 3: NOT DETECTED
Respiratory Syncytial Virus B: NOT DETECTED

## 2011-05-19 ENCOUNTER — Telehealth: Payer: Self-pay | Admitting: Pulmonary Disease

## 2011-05-19 MED ORDER — AMOXICILLIN-POT CLAVULANATE 500-125 MG PO TABS: 1.0000 | ORAL_TABLET | Freq: Two times a day (BID) | ORAL | Status: DC

## 2011-05-19 NOTE — Telephone Encounter (Signed)
Pt returned call.  Prod cough with mucus and "fresh blood" at 1 tablespoon "all together", increased SOB, wheezing.  Pt on 6th day of tamiflu d/t flu-like symptoms onset late last week given by Dr Drue Second with [facility].  [place].   Pt has bronchiectasis and MAC.  KC not office, RA please advise, thank you.    Allergies  Allergen Reactions  . Tequin

## 2011-05-19 NOTE — Telephone Encounter (Signed)
lmomtcb for pt. WCB after 2:00 as well

## 2011-05-19 NOTE — Telephone Encounter (Signed)
Per CDY: (1) I reviewed recent CT scan report - may have infection on top of known MAC.  This would explain blood.  (2) Rec: (a) continue present meds, (b) add augmentin 500mg  #14, 1 po bid.  Called spoke with patient, advised of CDY's recs as stated above.  Pt okay with these recs and verbalized her understanding.  rx sent to verified pharmacy.  Pt did report that she has a temp of 100 since last speaking with her.  Advised to take th augmentin and to alternate tylenol and advil when needed for the fever.  Advised pt to call 911 or go to UC if symptoms worsen.  Pt verbalized her understanding.

## 2011-05-26 ENCOUNTER — Telehealth: Payer: Self-pay | Admitting: Pulmonary Disease

## 2011-05-26 ENCOUNTER — Encounter: Payer: Self-pay | Admitting: Pulmonary Disease

## 2011-05-26 ENCOUNTER — Ambulatory Visit (INDEPENDENT_AMBULATORY_CARE_PROVIDER_SITE_OTHER): Payer: Medicare Other | Admitting: Pulmonary Disease

## 2011-05-26 ENCOUNTER — Telehealth: Payer: Self-pay | Admitting: *Deleted

## 2011-05-26 DIAGNOSIS — J449 Chronic obstructive pulmonary disease, unspecified: Secondary | ICD-10-CM | POA: Diagnosis not present

## 2011-05-26 DIAGNOSIS — R042 Hemoptysis: Secondary | ICD-10-CM

## 2011-05-26 DIAGNOSIS — J479 Bronchiectasis, uncomplicated: Secondary | ICD-10-CM

## 2011-05-26 DIAGNOSIS — A310 Pulmonary mycobacterial infection: Secondary | ICD-10-CM | POA: Diagnosis not present

## 2011-05-26 MED ORDER — AMOXICILLIN-POT CLAVULANATE 875-125 MG PO TABS: 1.0000 | ORAL_TABLET | Freq: Two times a day (BID) | ORAL | Status: AC

## 2011-05-26 NOTE — Telephone Encounter (Signed)
I spoke with pt and she states she has still been coughing up mucus w/ "bright red blood" in it. She estimated it to be about 1 tablespoon of mucus and blood. Pt denies any temp. Pt c/o nasal congestion, chest tightness, wheezing. Pt states she finished augmentin today. Pt has been taking robitussin DM TID and benzonatate at night. Pt is scheduled to come in and see KC to day 2:15

## 2011-05-26 NOTE — Assessment & Plan Note (Signed)
I believe the patient's recent episodes of hemoptysis and also constitutional symptoms are due to a MAC infection.  She has only partially responded to 3 times a week treatment, and I suspect this needs to be increased to every day.  I'm going to get the patient back to the infectious disease clinic to help with medication selection.

## 2011-05-26 NOTE — Patient Instructions (Signed)
Will extend the augment for another 3 days Continue with foradil and flutter valve Can use proair for rescue if you have issues with shortness of breath. Will get you back to infectious disease to look at treating your MAC more aggressively.

## 2011-05-26 NOTE — Progress Notes (Signed)
  Subjective:    Patient ID: Destiny Rodriguez, female    DOB: January 04, 1925, 78 y.o.   MRN: [id number]  HPI The patient comes in today for an acute visit related to her hemoptysis.  She has known severe bronchiectasis, along with cavitary disease on her CT chest consistent with her known MAC.  She most recently has had episodes of hemoptysis that were significant at times, and her CT chest showed cavitary disease.  She has also had constitutional symptoms consistent with MAC infection rather than colonization.  She has been treated in the past with every day medications for MAC, but this time around I was trying 3 times a week meds.  Because of her bouts of hemoptysis, I am beginning to think this needs to be treated more aggressively with everyday therapy.  She was recently referred to infectious disease, but she was unable to have a complete evaluation due to the presence of influenza.  She returns today where she has been having initially bright red hemoptysis but now dark after being treated with 7 days of Augmentin.  She feels better, but is continuing to have mucus with dark blood.  She currently is not having any fevers or chills.   Review of Systems  Constitutional: Negative for fever and unexpected weight change.  HENT: Negative for ear pain, nosebleeds, congestion, sore throat, rhinorrhea, sneezing, trouble swallowing, dental problem, postnasal drip and sinus pressure.   Eyes: Negative for redness and itching.  Respiratory: Positive for cough, chest tightness, shortness of breath and wheezing.   Cardiovascular: Negative for palpitations and leg swelling.  Gastrointestinal: Negative for nausea and vomiting.  Genitourinary: Negative for dysuria.  Musculoskeletal: Negative for joint swelling.  Skin: Negative for rash.  Neurological: Negative for headaches.  Hematological: Does not bruise/bleed easily.  Psychiatric/Behavioral: Negative for dysphoric mood. The patient is not nervous/anxious.         Objective:   Physical Exam Thin female in no acute distress Nose without purulence or discharge noted Chest with scattered crackles, especially in the bases.  No wheezes or rhonchi Cardiac exam with regular rate and rhythm Lower extremities without edema, no cyanosis noted Alert and oriented, moves all 4 extremities.       Assessment & Plan:

## 2011-05-26 NOTE — Assessment & Plan Note (Signed)
The pt was seen by ID, but had the flu at the time.  The pt tells me ID d/ced her MAC meds.  I think her MAC is causing her to have more frequent flareups and significant hemoptysis.  She is only on 3 times a week treatment currently, and I think she needs to be on every day treatment given that she has significant cavitary disease on her recent chest CT.  She had had a similar episode in the past, and responded well to every day medications for MAC.  I would like to get her back to infectious disease to relook at this issue, and also to help with medication selection.

## 2011-05-26 NOTE — Telephone Encounter (Signed)
Rhonda at Dr Teddy Spike office called said that the Dr is concerned and does not think that the patient can wait until March for a follow up apt. Wants Dr Drue Second to see the patient before then if at all possible preferably this week. He said that his note is in the chart and is finished so she can see what his concerns are. Advised will forward the note and will call with Dr Drue Second reply.

## 2011-05-27 ENCOUNTER — Telehealth: Payer: Self-pay | Admitting: *Deleted

## 2011-05-27 DIAGNOSIS — A310 Pulmonary mycobacterial infection: Secondary | ICD-10-CM | POA: Insufficient documentation

## 2011-05-27 NOTE — Telephone Encounter (Signed)
Rhonda from [facility] [employer] [facility] called [phone]. She wants the pt seen sooner than March for an appt. States she has hemoptysis

## 2011-05-27 NOTE — Progress Notes (Signed)
Patient ID: Destiny Rodriguez, female   DOB: [birth year], 78 y.o.   MRN: [id number]

## 2011-05-31 NOTE — Progress Notes (Signed)
INFECTIOUS DISEASES CLINIC VISIT  RFV: community referral for pulmonary MAC  Subjective:    Patient ID: Destiny Rodriguez, female    DOB: 1924-06-24, 78 y.o.   MRN: [id number]  HPI [name] is a pleasant 78 yo Female who has longstanding history of pulmonary MAC infection, initially treated in 2001, and re-started therapy in the past year with Clarithro, ethambutol, rifampin three times per week since April 2012. She states that her pulmonologist started her on treatment due to symptoms, of recurrent hemoptysis, and malaise/weight loss. She states that since taking her medicines three times per week that initially she had a response with less hemoptysis. Her appetite unchanged. She was referred to our clinic by her pulmonologist to see if further treatment is needed since she is still having 1-2 teaspoon of hemoptysis daily. She was last CT in December, and given a course of levofloxacin concern for secondary infection, given fluid in cystic lesions within lung parenchyma. Currently, the patient has influenza like illness with productive cough, myalgias, and low grade fever. She states that she had a flu contact, with one of her friends, who she plays cards with became ill 3-4 days ago. Overall, she feels poorly today. Still has some mild hemoptysis, 1-2 teaspoon of blood noted.  Active Ambulatory Problems    Diagnosis Date Noted  . Pulmonary diseases due to other mycobacteria 06/07/2008  . HYPERLIPIDEMIA 03/25/2007  . SINUSITIS, CHRONIC 06/07/2008  . ALLERGIC RHINITIS 06/07/2008  . BRONCHIECTASIS 03/25/2007  . COPD, MILD 06/07/2008  . HIATAL HERNIA 03/25/2007  . Hemoptysis 11/23/2010  . Black stool 11/23/2010  . MAC (mycobacterium avium-intracellulare complex) 05/27/2011   Resolved Ambulatory Problems    Diagnosis Date Noted  . UNSPECIFIED BACTERIAL PNEUMONIA 04/01/2010  . Hemoptysis 10/15/2008  . HEMOPTYSIS UNSPECIFIED 04/01/2010   Past Medical History  Diagnosis Date  . Allergic  rhinitis   . Hyperlipidemia   . Chronic sinusitis   . Bronchiectasis   . Rheumatism    Allergies: although, patient did tolerate taking a course of levofloxacin in fall/wtr 2012 Allergies  Allergen Reactions  . Tequin     Prior to Admission medications   Medication Sig Start Date End Date Taking? Authorizing Provider  atorvastatin (LIPITOR) 10 MG tablet Take 10 mg by mouth daily.     Yes Historical Provider, MD  calcium carbonate (OS-CAL) 600 MG TABS Take 600 mg by mouth 2 (two) times daily with a meal.     Yes Historical Provider, MD  MULTIPLE VITAMIN PO Take by mouth daily.    Yes Historical Provider, MD  PROAIR HFA 108 (90 BASE) MCG/ACT inhaler USE 2 PUFFS EVERY 4 - 6  HOURS AS NEEDED. SHAKEWELL. 09/09/10  Yes Barbaraann Share, MD  Respiratory Therapy Supplies (FLUTTER) DEVI by Does not apply route. Use as directed    Yes Historical Provider, MD  risedronate (ACTONEL) 35 MG tablet Take 35 mg by mouth every 7 (seven) days. with water on empty stomach, nothing by mouth or lie down for next 30 minutes.    Yes Historical Provider, MD  amoxicillin-clavulanate (AUGMENTIN) 875-125 MG per tablet Take 1 tablet by mouth 2 (two) times daily. 05/26/11 06/09/11  Barbaraann Share, MD  benzonatate (TESSALON PERLES) 100 MG capsule Take 2 capsules (200 mg total) by mouth 3 (three) times daily as needed for cough. 05/13/11 05/12/12  Judyann Munson, MD  formoterol (FORADIL) 12 MCG capsule for inhaler Place 12 mcg into inhaler and inhale 2 (two) times daily.  Historical Provider, MD   History  Substance Use Topics  . Smoking status: Never Smoker   . Smokeless tobacco: Never Used  . Alcohol Use: Not on file  family history includes Asthma in her father; Breast cancer in an unspecified family member; Clotting disorder in her mother; Emphysema in her father; and Heart disease in her father.   Review of Systems  Constitutional: positive for fever, chills, appetite change, and fatigue. But no diaphoresis,    HENT: Negative for congestion, sore throat, rhinorrhea, sneezing, trouble swallowing and sinus pressure.  Eyes: Negative for photophobia and visual disturbance.  Respiratory: positive for cough,  shortness of breath, wheezing. No chest tightness or stridor.  Cardiovascular: Negative for chest pain, palpitations and leg swelling.  Gastrointestinal: Negative for nausea, vomiting, abdominal pain, diarrhea, constipation, blood in stool, abdominal distention and anal bleeding.  Genitourinary: Negative for dysuria, hematuria, flank pain and difficulty urinating.  Musculoskeletal: Negative for myalgias, back pain, joint swelling, arthralgias and gait problem.  Skin: Negative for color change, pallor, rash and wound.  Neurological: Negative for dizziness, tremors, weakness and light-headedness.  Hematological: Negative for adenopathy. Does not bruise/bleed easily.  Psychiatric/Behavioral: Negative for behavioral problems, confusion, sleep disturbance, dysphoric mood, decreased concentration and agitation.       Objective:   Physical Exam  BP 136/75  Pulse 93  Temp(Src) 97.8 F (36.6 C) (Oral)  Ht 5\' 4"  (1.626 m)  Wt 94 lb (42.638 kg)  BMI 16.14 kg/m2  General Appearance:    Alert, cooperative, no distress, appears stated age  Head:    Normocephalic, without obvious abnormality, atraumatic  Eyes:    PERRL, conjunctiva/corneas clear, EOM's intact, fundi    benign, both eyes  Ears:    Normal TM's and external ear canals, both ears  Nose:   Nares normal, septum midline, mucosa normal, no drainage    or sinus tenderness  Throat:   Lips, mucosa, and tongue normal; teeth and gums normal  Neck:   Supple, symmetrical, trachea midline, no adenopathy;  Back:     Symmetric, no curvature, ROM normal, no CVA tenderness  Lungs:     Wheezing heard throughout, mild rales at bases. Non-labored      Heart:    Regular rate and rhythm, S1 and S2 normal, no murmur, rub   or gallop     Abdomen:     Soft,  non-tender, bowel sounds active all four quadrants,    no masses, no organomegaly        Extremities:   Extremities normal, atraumatic, no cyanosis or edema  Pulses:   2+ and symmetric all extremities  Skin:   Skin color, texture, turgor normal, no rashes or lesions  Lymph nodes:   Cervical, supraclavicular, and axillary nodes normal       Recent imaging: chest CT dec 2012:  Comparison: Chest radiograph 11/23/2010  Findings: Biapical pleural parenchymal scarring. There is fairly  diffuse peribronchovascular nodularity, bronchiectasis and  associated cystic change in the lungs bilaterally. Some of the  areas of cyst formation contain debris or air-fluid levels (example  images 32, 45 and 48). The largest nodules in the left lower lobe  measure up to 10 mm (images 30 and 36). There are areas of mucoid  impaction bilaterally. Trace left pleural fluid. Airway is  otherwise unremarkable.  Incidental imaging of the upper abdomen shows intrahepatic biliary  duct dilatation. There may be renal sinus cysts in the left  kidney. No worrisome lytic or sclerotic lesions. Degenerative  changes are seen in the spine.  IMPRESSION:  1. Pulmonary parenchymal findings of mycobacterium avium complex  (MAC), with soft tissue debris and/or fluid in some areas of cyst  formation in the lingula and right lower lobe, suggesting  superimposed infection.  2. Left lower lobe nodules are likely part of the MAC spectrum.  3. Follow-up of the above findings can be performed in 3 months to  ensure stability and/or resolution, as clinically indicated. This  recommendation follows the consensus statement: Guidelines for  Management of Small Pulmonary Nodules Detected on CT Scans: A  Statement from the Fleischner Society as published in Radiology  2005; 237:395-400.       Assessment & Plan:  Presumed influenza = patient has been having symptoms for 4-5 days. However, her underlying lung disease of COPD places  her at risk for further complications. We will give an rx for oseltamavir and tessalon perles, as well as robitussin with codeine to help with symptoms management.  MAC pulmonary disease = upon review of her chest CT, she does have diffuse abnormalities of bronchiectesis, cystic lesion that is not amenable to surgery. Once patient has recovered from her current ILI, we will readdress options of treatment. I am not convinced that her current treatment, now 8 months, has improved her scenario. One option to is hold treatment and continue to monitor symptoms. We will ask her to hold taking her current medication until we see her again back in the ID clinic < 1 month. IF symptoms worsen, we can offer daily treatment with clarithro/ethambutol/rifampin and watch for symptoms improvement. IF no improvement on daily dosing, we will consider injectables.   We will contact pulmonologist office or [facility] in [place] to get copies of susceptibilities from isolate in 02/2010.   We will collect sputum specimen from today's visit to see if can isolate again.   Mrs. Cyr, represents the classic description of lady [place]. MAC pulmonary disease in thin female no history of smoking. MAC pulm disease treatment in an octogenarian will be challenging since it deals with the balance of symptoms from MAC disease process versus side effects of medications for treatment. Response is often slow. or no worsening of disease process would be the goal.  RTC in 1 month or sooner if symptoms worsen

## 2011-06-01 ENCOUNTER — Ambulatory Visit (INDEPENDENT_AMBULATORY_CARE_PROVIDER_SITE_OTHER): Payer: Medicare Other | Admitting: Internal Medicine

## 2011-06-01 ENCOUNTER — Telehealth: Payer: Self-pay | Admitting: *Deleted

## 2011-06-01 ENCOUNTER — Encounter: Payer: Self-pay | Admitting: Internal Medicine

## 2011-06-01 VITALS — BP 149/89 | HR 87 | Temp 97.7°F | Wt 95.0 lb

## 2011-06-01 DIAGNOSIS — A310 Pulmonary mycobacterial infection: Secondary | ICD-10-CM

## 2011-06-01 DIAGNOSIS — A318 Other mycobacterial infections: Secondary | ICD-10-CM | POA: Diagnosis not present

## 2011-06-01 MED ORDER — RIFAMPIN 150 MG PO CAPS: 150.0000 mg | ORAL_CAPSULE | Freq: Every day | ORAL | Status: AC

## 2011-06-01 MED ORDER — CLARITHROMYCIN 500 MG PO TABS: 500.0000 mg | ORAL_TABLET | Freq: Every day | ORAL | Status: AC

## 2011-06-01 MED ORDER — RIFAMPIN 300 MG PO CAPS: 300.0000 mg | ORAL_CAPSULE | Freq: Every day | ORAL | Status: AC

## 2011-06-01 MED ORDER — ETHAMBUTOL HCL 400 MG PO TABS: 600.0000 mg | ORAL_TABLET | Freq: Every day | ORAL | Status: DC

## 2011-06-01 NOTE — Telephone Encounter (Signed)
Kim from [facility] called to say that patient is there and thought she was getting a nausea med. Advised her would check with the provider and get back to her.  Paged provider and she said she would call the pharmacy and give them the order. Provided her with the phone number to the pharmacy [phone]) and called patient to inform her the med is ready to be picked up. Apologized for the over site.

## 2011-06-01 NOTE — Progress Notes (Signed)
INFECTIOUS DISEASES CLINIC NOTE  RFV: follow up to initial visit on tx for MAC pulmonary disease Subjective:    Patient ID: Destiny Rodriguez, female    DOB: 1924/06/24, 78 y.o.   MRN: [id number]  HPI [name] is an 78 yo Female with history of bronchiectesis, and MAC pulmonary disease. Last chest ct shows nodular-cavitary lesions in Dec 2012. She was diagnosed with MAC pulmonary disease in 2001, but she thought she may have had disease prior to that she is known to have frequent pulmonary infections. She was treated back in 2001 with clarithromycin and ethambutol for approximately 1 year. She has been followed for her symptoms which have had on-off exacerbations. In April 2012, the decision was made to re-initiate treatment, due to her symptoms of hemoptysis, malaise, and weight loss. She was started on three times per week treatment with clarithromycin, ethambutol and rifampin which she initiallly had improved symptoms in the first 2 months then returned back to having intermittent hemoptysis. In December 2012, she had completed 8 months of therapy and felt no different on treatment whereas off treatment. She had significant nausea, anorexia associated with taking the medications. She was seen in the ID clinic on 12/20, where her primary complaints of ILI were concerning for her since she had flu + exposure. She was given oseltamivir, cough suppressant medications, which helped her symptoms initially. On christmas night, her hemoptysis and cough were at its worse, roughly 1 tablespoon of bright red blood noted in her sputum. She was then given a course of amox/clav by her pulmonologist for which had completely resolved her hemoptysis over the last 2-3 days. Since being off of her MAC treatment, she is feeling better with exception to her primary cough and blood tinged sputum  [name] comes to clinic to discuss the pros and cons of treating moderate MAC pulmonary disease.since her illness this past  December, she feels her cough is improved since using robitussin, but not noticeable improvement with tessalon perles. She uses an accapello valve to help break up mucus two times per day which is useful. Diet and appetite is good. No nausea at present.    Active Ambulatory Problems    Diagnosis Date Noted  . Pulmonary diseases due to other mycobacteria 06/07/2008  . HYPERLIPIDEMIA 03/25/2007  . SINUSITIS, CHRONIC 06/07/2008  . ALLERGIC RHINITIS 06/07/2008  . BRONCHIECTASIS 03/25/2007  . COPD, MILD 06/07/2008  . HIATAL HERNIA 03/25/2007  . Hemoptysis 11/23/2010  . Black stool 11/23/2010  . MAC (mycobacterium avium-intracellulare complex) 05/27/2011   Resolved Ambulatory Problems    Diagnosis Date Noted  . UNSPECIFIED BACTERIAL PNEUMONIA 04/01/2010  . Hemoptysis 10/15/2008  . HEMOPTYSIS UNSPECIFIED 04/01/2010   Past Medical History  Diagnosis Date  . Allergic rhinitis   . Hyperlipidemia   . Chronic sinusitis   . Bronchiectasis   . Rheumatism    Prior to Admission medications   Medication Sig Start Date End Date Taking? Authorizing Provider  amoxicillin-clavulanate (AUGMENTIN) 875-125 MG per tablet Take 1 tablet by mouth 2 (two) times daily. 05/26/11 06/09/11 Yes Barbaraann Share, MD  atorvastatin (LIPITOR) 10 MG tablet Take 10 mg by mouth daily.     Yes Historical Provider, MD  benzonatate (TESSALON PERLES) 100 MG capsule Take 2 capsules (200 mg total) by mouth 3 (three) times daily as needed for cough. 05/13/11 05/12/12 Yes Judyann Munson, MD  calcium carbonate (OS-CAL) 600 MG TABS Take 600 mg by mouth 2 (two) times daily with a meal.  Yes Historical Provider, MD  formoterol (FORADIL) 12 MCG capsule for inhaler Place 12 mcg into inhaler and inhale 2 (two) times daily.     Yes Historical Provider, MD  MULTIPLE VITAMIN PO Take by mouth daily.    Yes Historical Provider, MD  PROAIR HFA 108 (90 BASE) MCG/ACT inhaler USE 2 PUFFS EVERY 4 - 6  HOURS AS NEEDED. SHAKEWELL. 09/09/10  Yes  Barbaraann Share, MD  Respiratory Therapy Supplies (FLUTTER) DEVI by Does not apply route. Use as directed    Yes Historical Provider, MD  risedronate (ACTONEL) 35 MG tablet Take 35 mg by mouth every 7 (seven) days. with water on empty stomach, nothing by mouth or lie down for next 30 minutes.    Yes Historical Provider, MD  clarithromycin (BIAXIN) 500 MG tablet Take 1 tablet (500 mg total) by mouth daily. 06/01/11 06/15/11  Judyann Munson, MD  ethambutol (MYAMBUTOL) 400 MG tablet Take 1.5 tablets (600 mg total) by mouth daily. 06/01/11 05/31/12  Judyann Munson, MD  rifampin (RIFADIN) 150 MG capsule Take 1 capsule (150 mg total) by mouth daily. Take along with 300mg  tablet, for a total of 450mg  daily 06/01/11 06/06/11  Judyann Munson, MD  rifampin (RIFADIN) 300 MG capsule Take 1 capsule (300 mg total) by mouth daily. Take with 150 mg tablet for a total of 450mg  daily 06/01/11 06/06/11  Judyann Munson, MD   Allergies  Allergen Reactions  . Tequin   but tolerates levofloxacin  SH: [facility].no smoking. No alcohol use.previous hobby of working with wood shingles   Review of Systems  Constitutional: Negative for fever, chills, diaphoresis, activity change, appetite change, fatigue and unexpected weight change.  HENT: Negative for congestion, sore throat, rhinorrhea, sneezing, trouble swallowing and sinus pressure.  Eyes: Negative for photophobia and visual disturbance.  Respiratory: positive for productive cough with occasional blood,  shortness of breath with mild exertion, occ. wheezing and no stridor.  Cardiovascular: Negative for chest pain, palpitations and leg swelling.  Gastrointestinal: Negative for nausea, vomiting, abdominal pain, diarrhea, constipation, blood in stool, abdominal distention and anal bleeding.  Genitourinary: Negative for dysuria, hematuria, flank pain and difficulty urinating.  Musculoskeletal: Negative for myalgias, back  pain, joint swelling, arthralgias and gait problem.  Skin: Negative for color change, pallor, rash and wound.  Neurological: Negative for dizziness, tremors, weakness and light-headedness.  Hematological: Negative for adenopathy. Does not bruise/bleed easily.  Psychiatric/Behavioral: Negative for behavioral problems, confusion, sleep disturbance, dysphoric mood, decreased concentration and agitation.       Objective:   Physical Exam        Assessment & Plan:  Fibro-Nodular & Cavitary MAC lung disease  Mrs. Cogan appears to have worsening of her MAC pulmonary disease. We have discussed that MAC pulmonary disease is difficult process as she knows. The crux of it all depends on the balance of her symptoms for her pulmonary disease vs. The side effects of the medications.  - i am concerned that she will not tolerate daily dosing of clarithro, ethambutol and rifampin which is recommended for mod-severe disease per IDSA guidelines.  - we will ask patient to start daily treatment with:(these meds have been dose adjusted for her weight) -- clarithromycin 500mg  daily -- ethambutol 600mg  daily -- rifampin 450mg  daily  - she is to call us if her symptoms of nausea or anorexia worsen.  - i would like her to get baseline eye exam since we are starting ethambutol on daily  basis  - we will track down susceptibility testing for [facility]  - if she has worsening nausea with daily treatment, we can switch her to azithromycin since head to head studies do not show difference between clarithro and azithro, plus provide anti-emetic.  - we will see her back in 3-4 wk to see how she is feeling with medications or sooner if intolerance prevails.

## 2011-06-02 ENCOUNTER — Telehealth: Payer: Self-pay | Admitting: Pulmonary Disease

## 2011-06-02 NOTE — Telephone Encounter (Signed)
Megan, please see if we can dig up her [employer] sensititivities from [facility] out of old centricity, and fax to Dr. Drue Second at ID clinic.  I think was 2011.  Please show to me before faxing.  Thanks.

## 2011-06-03 ENCOUNTER — Other Ambulatory Visit: Payer: Self-pay | Admitting: Licensed Clinical Social Worker

## 2011-06-04 ENCOUNTER — Telehealth: Payer: Self-pay | Admitting: Internal Medicine

## 2011-06-04 NOTE — Telephone Encounter (Signed)
[facility] results on MAC susceptibilities from 05/01/10 ( fax results will be scanned into epic) cipro 4 Clofazimine < 0.06 Rifabutin 0.5 Rifampin 2.0 Kanamycin 4.0 Cycloserine >16 Ethionamide > 4 Ethambutol < 2 Streptomycin < 2 Amikacin 4 Clarithromycin <4 moxifloxacin 1

## 2011-06-04 NOTE — Telephone Encounter (Signed)
Verified correct paperwork with [facility] and faxed over to ID at [phone]

## 2011-06-09 ENCOUNTER — Telehealth: Payer: Self-pay | Admitting: *Deleted

## 2011-06-09 NOTE — Telephone Encounter (Signed)
Unable to evacuate stool from rectum.  Has taken metamucil in the past.  Took a dose starting yesterday.  RN advised pt to use a Glycerin suppository to help evacuate the stool from the rectum.  Advised to use per the package instructions.  Lie down on left side after inserting the suppository.  Pt to continue with daily Metamucil.  Pt verbalized instructions.  Will talk with pharmacist when she goes to the drug store.

## 2011-06-10 DIAGNOSIS — K5900 Constipation, unspecified: Secondary | ICD-10-CM | POA: Diagnosis not present

## 2011-06-22 ENCOUNTER — Ambulatory Visit: Payer: Medicare Other | Admitting: Internal Medicine

## 2011-06-25 LAB — AFB CULTURE WITH SMEAR (NOT AT ARMC): Acid Fast Smear: NONE SEEN

## 2011-07-01 ENCOUNTER — Ambulatory Visit (INDEPENDENT_AMBULATORY_CARE_PROVIDER_SITE_OTHER): Payer: Medicare Other | Admitting: Internal Medicine

## 2011-07-01 ENCOUNTER — Encounter: Payer: Self-pay | Admitting: Internal Medicine

## 2011-07-01 VITALS — BP 116/68 | HR 81 | Temp 97.8°F | Wt 99.8 lb

## 2011-07-01 DIAGNOSIS — A318 Other mycobacterial infections: Secondary | ICD-10-CM | POA: Diagnosis not present

## 2011-07-01 DIAGNOSIS — A310 Pulmonary mycobacterial infection: Secondary | ICD-10-CM

## 2011-07-02 LAB — COMPLETE METABOLIC PANEL WITH GFR
ALT: 11 U/L (ref 0–35)
AST: 17 U/L (ref 0–37)
CO2: 34 mEq/L — ABNORMAL HIGH (ref 19–32)
Calcium: 9.5 mg/dL (ref 8.4–10.5)
Chloride: 100 mEq/L (ref 96–112)
GFR, Est African American: 87 mL/min
Potassium: 4.6 mEq/L (ref 3.5–5.3)
Sodium: 140 mEq/L (ref 135–145)
Total Protein: 7.1 g/dL (ref 6.0–8.3)

## 2011-07-02 LAB — CBC WITH DIFFERENTIAL/PLATELET
Basophils Absolute: 0 10*3/uL (ref 0.0–0.1)
Lymphocytes Relative: 19 % (ref 12–46)
Lymphs Abs: 1.6 10*3/uL (ref 0.7–4.0)
MCV: 96.4 fL (ref 78.0–100.0)
Neutro Abs: 5.4 10*3/uL (ref 1.7–7.7)
Neutrophils Relative %: 63 % (ref 43–77)
Platelets: 245 10*3/uL (ref 150–400)
RBC: 4.19 MIL/uL (ref 3.87–5.11)
RDW: 14.4 % (ref 11.5–15.5)
WBC: 8.6 10*3/uL (ref 4.0–10.5)

## 2011-07-05 NOTE — Progress Notes (Signed)
INFECTIOUS DISEASES CLINIC  RFV: 3 wks on MAC treatment - follow up visit  Subjective:    Patient ID: Destiny Rodriguez, female    DOB: [birth year], 78 y.o.   MRN: [id number]  HPI Destiny Rodriguez is an 78 yo female with history of bronchiectesis, and MAC pulmonary disease dx in 2001. Last chest ct shows nodular-cavitary lesions in Dec 2012. She was diagnosed with MAC pulmonary disease in 2001, but she thought she may have had disease prior to that she is known to have frequent pulmonary infections. Due to worsening symptoms of DOE, hemoptysis, decrease exercise tolerance, she has re-initiated treatment with clarithro ,EMB, RIF on a daily basis. For which she is tolerating sufficiently enough for the past 3 weeks.She feels somewhat better, sleeping well with less coughing keeping her up at night. She does notice an association of having worsening hemoptysis when her cough is the most forceful.  She states that the anti-emetics are helping with the side effects of the medication. She states she is eating ok, but her main trouble now is that she has been struggling with constipation. Otherwise, no fever/chills/diarrhea/still has cough/some element of exertional dyspnea/ no chest pain/ no blurry vision/ nausea is very mild with anti-emetic. She currently is taking miralax as needed for constipation but it does cause increasing gassiness/abdominal bloating.   MAC tx history:  -- in 2001 with clarithromycin and ethambutol for approximately 1 year. --In April 2012, with clarithromycin, EMB, RIF TIW the decision was made to re-initiate treatment, due to her symptoms of hemoptysis, malaise, and weight loss.which she initiallly had improved symptoms in the first 2 months then returned back to having intermittent hemoptysis. By December 2012, she had completed 8 months of therapy and felt no different on treatment whereas off treatment. She had significant nausea, anorexia associated with taking the medications.   She  states that she is holding off on taking her actonel since she feels that she would not be able drink sufficient water in addn to the water she takes for her meds as they stand.  Prior to Admission medications   Medication Sig Start Date End Date Taking? Authorizing Provider  atorvastatin (LIPITOR) 10 MG tablet Take 10 mg by mouth daily.     Yes Historical Provider, MD  benzonatate (TESSALON PERLES) 100 MG capsule Take 2 capsules (200 mg total) by mouth 3 (three) times daily as needed for cough. 05/13/11 05/12/12 Yes Judyann Munson, MD  calcium carbonate (OS-CAL) 600 MG TABS Take 600 mg by mouth 2 (two) times daily with a meal.     Yes Historical Provider, MD  ethambutol (MYAMBUTOL) 400 MG tablet Take 1.5 tablets (600 mg total) by mouth daily. 06/01/11 05/31/12 Yes Judyann Munson, MD  formoterol (FORADIL) 12 MCG capsule for inhaler Place 12 mcg into inhaler and inhale 2 (two) times daily.     Yes Historical Provider, MD  MULTIPLE VITAMIN PO Take by mouth daily.    Yes Historical Provider, MD  PROAIR HFA 108 (90 BASE) MCG/ACT inhaler USE 2 PUFFS EVERY 4 - 6  HOURS AS NEEDED. SHAKEWELL. 09/09/10  Yes Barbaraann Share, MD  Respiratory Therapy Supplies (FLUTTER) DEVI by Does not apply route. Use as directed    Yes Historical Provider, MD  risedronate (ACTONEL) 35 MG tablet Take 35 mg by mouth every 7 (seven) days. with water on empty stomach, nothing by mouth or lie down for next 30 minutes.    Yes Historical Provider, MD   Active medical problems/past medical  history - reviewed and unchanged  Review of Systems 10 point review of systems is otherwise negative other than what is mentioned in HPI/     Objective:   Physical Exam BP 116/68  Pulse 81  Temp(Src) 97.8 F (36.6 C) (Oral)  Wt 99 lb 12.8 oz (45.269 kg)  General Appearance:    Alert, cooperative, no distress, appears stated age, thin female  Head:    Normocephalic, without obvious abnormality, atraumatic  Eyes:    PERRL, conjunctiva/corneas  clear, EOM's intact,      Nose:   Nares normal, septum midline, mucosa normal, no drainage    or sinus tenderness  Throat:   Lips, mucosa, and tongue normal; teeth and gums normal  Neck:   Supple, symmetrical, trachea midline, no adenopathy;      Back:     Symmetric, no curvature, ROM normal, no CVA tenderness  Lungs:     Clear to auscultation bilaterally, respirations unlabored      Heart:    Regular rate and rhythm, S1 and S2 normal, no murmur, rub   or gallop     Abdomen:     Soft, non-tender, bowel sounds active all four quadrants,    no masses, no organomegaly        Extremities:   Extremities normal, atraumatic, no cyanosis or edema  Pulses:   2+ and symmetric all extremities  Skin:   Skin color, texture, turgor normal, no rashes or lesions  Lymph nodes:   Cervical, supraclavicular, and axillary nodes normal  Neurologic:   CNII-XII intact, normal strength, sensation and reflexes    throughout   CBC    Component Value Date/Time   WBC 8.6 07/01/2011 1622   RBC 4.19 07/01/2011 1622   HGB 12.8 07/01/2011 1622   HCT 40.4 07/01/2011 1622   PLT 245 07/01/2011 1622   MCV 96.4 07/01/2011 1622   MCH 30.5 07/01/2011 1622   MCHC 31.7 07/01/2011 1622   RDW 14.4 07/01/2011 1622   LYMPHSABS 1.6 07/01/2011 1622   MONOABS 1.3* 07/01/2011 1622   EOSABS 0.2 07/01/2011 1622   BASOSABS 0.0 07/01/2011 1622    CMP     Component Value Date/Time   NA 140 07/01/2011 1622   K 4.6 07/01/2011 1622   CL 100 07/01/2011 1622   CO2 34* 07/01/2011 1622   GLUCOSE 104* 07/01/2011 1622   BUN 12 07/01/2011 1622   CREATININE 0.73 07/01/2011 1622   CALCIUM 9.5 07/01/2011 1622   PROT 7.1 07/01/2011 1622   ALBUMIN 3.6 07/01/2011 1622   AST 17 07/01/2011 1622   ALT 11 07/01/2011 1622   ALKPHOS 92 07/01/2011 1622   BILITOT 0.3 07/01/2011 1622       Assessment & Plan:  Pulmonary MAC disease = patient has been on 3 wks of therapy, of clarithromycin 500mg  daily, ethambutol 600mg  daily, and rifampin 450mg  daily. Her labs (CMET) looks within normal  range with exception to elevated bicarb which is near her baseline. Will continue her current regimen.  Constipation = will recommend to continue miralax as needed, but can also use milk of magnesium, but not daily. i also recommended to use bisacodyl suppository.   will have the patient return in 4-6 wks to see how she is doing.  Duke Salvia Drue Second MD MPH [facility] [phone]

## 2011-07-13 DIAGNOSIS — H1044 Vernal conjunctivitis: Secondary | ICD-10-CM | POA: Diagnosis not present

## 2011-07-29 ENCOUNTER — Ambulatory Visit (INDEPENDENT_AMBULATORY_CARE_PROVIDER_SITE_OTHER): Payer: Medicare Other | Admitting: Internal Medicine

## 2011-07-29 ENCOUNTER — Encounter: Payer: Self-pay | Admitting: Internal Medicine

## 2011-07-29 VITALS — BP 129/80 | HR 91 | Temp 97.7°F | Wt 97.0 lb

## 2011-07-29 DIAGNOSIS — A310 Pulmonary mycobacterial infection: Secondary | ICD-10-CM | POA: Diagnosis not present

## 2011-07-31 NOTE — Progress Notes (Signed)
INFECTIOUS DISEASES CLINIC ROUTINE FOLLOW UP VISIT  RFV = follow up on MAC pulmonary treatment  Subjective:    Patient ID: Destiny Rodriguez, female    DOB: Jun 13, 1924, 78 y.o.   MRN: [id number]  HPI  Ms. Tedrick is an 78 yo female with history of bronchiectesis, and MAC pulmonary disease dx in 2001. Last chest ct shows nodular-cavitary lesions in Dec 2012. Her MAC pulmonary disease  Initially diagnosed in 2001, but she thought she may have had disease prior to that she is known to have frequent pulmonary infections.   Due to worsening symptoms of DOE, hemoptysis, decrease exercise tolerance, she has re-initiated treatment with clarithro 500mg  ,EMB 600mg  , RIF 450mg   on a daily basis since [facility] January 2013. For which she is tolerating sufficiently enough despite having early satiety due to the water consumption associated with taking her medications.She feels somewhat better, sleeping well with less coughing keeping her up at night.  She did have an episode of having a cold on February 11th that lasted roughly 1 wk. Since feb 19th she feels that her symptoms have improved. She has decreased productive cough and trace blood in her sputum.   In regards to side effects of medication, She takes the anti-emetics only as needed. She states that she that the medicines treat her nausea but she also gets occasional gassy lower abodminal discomfort. She has a history of IBS and diverticulosis, so she is unclear if this part of the presentation of her GI symptoms versus a sequelae of her medicines. She states she is eating ok but still somewhat small meals due to feeling full from water consumption. Her constipation has resolved. She only uses miralax as needed.  Otherwise, no fever/chills/diarrhea/still has cough/some element of exertional dyspnea/ no chest pain/ no blurry vision/ nausea is very mild with anti-emetic.   MAC tx history:  -- in 2001 with clarithromycin and ethambutol for approximately 1 year.    --In April 2012, with clarithromycin, EMB, RIF TIW the decision was made to re-initiate treatment, due to her symptoms of hemoptysis, malaise, and weight loss.which she initiallly had improved symptoms in the first 2 months then returned back to having intermittent hemoptysis. By December 2012, she had completed 8 months of therapy and felt no different on treatment whereas off treatment. She had significant nausea, anorexia associated with taking the medications.   Active Ambulatory Problems    Diagnosis Date Noted  . Pulmonary diseases due to other mycobacteria 06/07/2008  . HYPERLIPIDEMIA 03/25/2007  . SINUSITIS, CHRONIC 06/07/2008  . ALLERGIC RHINITIS 06/07/2008  . BRONCHIECTASIS 03/25/2007  . COPD, MILD 06/07/2008  . HIATAL HERNIA 03/25/2007  . Hemoptysis 11/23/2010  . Black stool 11/23/2010  . MAC (mycobacterium avium-intracellulare complex) 05/27/2011   Resolved Ambulatory Problems    Diagnosis Date Noted  . UNSPECIFIED BACTERIAL PNEUMONIA 04/01/2010  . Hemoptysis 10/15/2008  . HEMOPTYSIS UNSPECIFIED 04/01/2010   Past Medical History  Diagnosis Date  . Allergic rhinitis   . Hyperlipidemia   . Chronic sinusitis   . Bronchiectasis   . Rheumatism    Prior to Admission medications   Medication Sig Start Date End Date Taking? Authorizing Provider  atorvastatin (LIPITOR) 10 MG tablet Take 10 mg by mouth daily.     Yes Historical Provider, MD  benzonatate (TESSALON PERLES) 100 MG capsule Take 2 capsules (200 mg total) by mouth 3 (three) times daily as needed for cough. 05/13/11 05/12/12 Yes Judyann Munson, MD  calcium carbonate (OS-CAL) 600 MG TABS Take  600 mg by mouth 2 (two) times daily with a meal.     Yes Historical Provider, MD  ethambutol (MYAMBUTOL) 400 MG tablet Take 1.5 tablets (600 mg total) by mouth daily. 06/01/11 05/31/12 Yes Judyann Munson, MD  formoterol (FORADIL) 12 MCG capsule for inhaler Place 12 mcg into inhaler and inhale 2 (two) times daily.     Yes Historical  Provider, MD  MULTIPLE VITAMIN PO Take by mouth daily.    Yes Historical Provider, MD  PROAIR HFA 108 (90 BASE) MCG/ACT inhaler USE 2 PUFFS EVERY 4 - 6  HOURS AS NEEDED. SHAKEWELL. 09/09/10  Yes Barbaraann Share, MD  Respiratory Therapy Supplies (FLUTTER) DEVI by Does not apply route. Use as directed    Yes Historical Provider, MD  risedronate (ACTONEL) 35 MG tablet Take 35 mg by mouth every 7 (seven) days. with water on empty stomach, nothing by mouth or lie down for next 30 minutes.    Yes Historical Provider, MD    Review of Systems 10 point review of systems has been reviewed, positive pertinents listed in HPI. Otherwise negative.     Objective:   Physical Exam BP 129/80  Pulse 91  Temp(Src) 97.7 F (36.5 C) (Oral)  Wt 97 lb (43.999 kg) Physical Exam  Constitutional:  oriented to person, place, and time. Frail appearing but in No distress.  HENT:  Mouth/Throat: Oropharynx is clear and moist. No oropharyngeal exudate.  Cardiovascular: Normal rate, regular rhythm and normal heart sounds. Exam reveals no gallop and no friction rub.  No murmur heard.  Pulmonary/Chest: Effort normal and no respiratory distress. No tachypnea. Mild expiratory rhonchi greater appreciated in ant lung fields > bases bilaterally Abdominal: Soft. Bowel sounds are normal. He exhibits no distension. There is no tenderness.  Lymphadenopathy:  no cervical nor axillary adenopathy.  Skin: Skin is warm and dry. No rash noted. No erythema.  Psychiatric:  normal mood and affect    Assessment & Plan:  MAC pulmonary disease = will continue on daily regimen of clarithromycin, ethambutol and rifampin. Will not check any LFTs at this visit but will do so at next visit.  Drug toxicity = will ask patient to call if she notices any increased blurriness of vision as a result of ethambutol or jaundice from rifampin.  Malnutrition = will ask patient to increase protein intake. She does not want to use ensure as of yet.  rtc in  6-8 wks.

## 2011-08-12 ENCOUNTER — Ambulatory Visit: Payer: Medicare Other | Admitting: Internal Medicine

## 2011-09-13 ENCOUNTER — Ambulatory Visit: Payer: Medicare Other | Admitting: Internal Medicine

## 2011-09-16 ENCOUNTER — Encounter: Payer: Self-pay | Admitting: Internal Medicine

## 2011-09-16 ENCOUNTER — Ambulatory Visit (INDEPENDENT_AMBULATORY_CARE_PROVIDER_SITE_OTHER): Payer: Medicare Other | Admitting: Internal Medicine

## 2011-09-16 VITALS — BP 128/78 | HR 87 | Temp 97.5°F | Wt 96.0 lb

## 2011-09-16 DIAGNOSIS — A310 Pulmonary mycobacterial infection: Secondary | ICD-10-CM

## 2011-09-16 DIAGNOSIS — A318 Other mycobacterial infections: Secondary | ICD-10-CM

## 2011-09-16 LAB — COMPREHENSIVE METABOLIC PANEL
ALT: 14 U/L (ref 0–35)
AST: 18 U/L (ref 0–37)
Albumin: 4 g/dL (ref 3.5–5.2)
Alkaline Phosphatase: 102 U/L (ref 39–117)
Potassium: 4.7 mEq/L (ref 3.5–5.3)
Sodium: 137 mEq/L (ref 135–145)
Total Bilirubin: 0.5 mg/dL (ref 0.3–1.2)
Total Protein: 7.7 g/dL (ref 6.0–8.3)

## 2011-09-16 LAB — CBC WITH DIFFERENTIAL/PLATELET
Basophils Absolute: 0 10*3/uL (ref 0.0–0.1)
Basophils Relative: 0 % (ref 0–1)
Eosinophils Absolute: 0.2 10*3/uL (ref 0.0–0.7)
Hemoglobin: 14.2 g/dL (ref 12.0–15.0)
MCH: 31.3 pg (ref 26.0–34.0)
MCHC: 31.9 g/dL (ref 30.0–36.0)
Neutro Abs: 4.4 10*3/uL (ref 1.7–7.7)
Neutrophils Relative %: 59 % (ref 43–77)
Platelets: 245 10*3/uL (ref 150–400)

## 2011-09-16 NOTE — Progress Notes (Signed)
INFECTIOUS DISEASES CLINIC  RFV: follow up on MAC tx Subjective:    Patient ID: Destiny Rodriguez, female    DOB: 05-12-1925, 78 y.o.   MRN: [id number]  HPI Destiny Rodriguez is an 78 yo female with history of bronchiectesis, and MAC pulmonary disease dx in 2001. Last chest ct shows nodular-cavitary lesions in Dec 2012. She was diagnosed with MAC pulmonary disease in 2001, but she thought she may have had disease prior to that she is known to have frequent pulmonary infections. Due to worsening symptoms of DOE, hemoptysis, decrease exercise tolerance, she has re-initiated treatment with clarithro ,EMB, RIF on a daily basis beginning in mid/late January.  The patient states that she is feeling she is having better energy level. She last had an episode of hemoptysis at end of march, roughly 5 wks ago. Otherwise, has been doing well. Still gets winded with walking up elevation. Using inhaler helps her DOE.  No fever/chills/nightsweats/nor hemoptysis/ stable chronic cough. Appetite good but no increase in weight, still at 96lbs.  MAC tx history:  -- in 2001 with clarithromycin and ethambutol for approximately 1 year.  --In April 2012, with clarithromycin, EMB, RIF TIW the decision was made to re-initiate treatment, due to her symptoms of hemoptysis, malaise, and weight loss.which she initiallly had improved symptoms in the first 2 months then returned back to having intermittent hemoptysis. By December 2012, she had completed 8 months of therapy and felt no different on treatment whereas off treatment. She had significant nausea, anorexia associated with taking the medications.   Review of Systems  Constitutional: Negative for fever, chills, diaphoresis, activity change, appetite change, fatigue and unexpected weight change.  HENT: Negative for congestion, sore throat, rhinorrhea, sneezing, trouble swallowing and sinus pressure.  Eyes: Negative for photophobia and visual disturbance.  Respiratory: Negative  for cough, chest tightness, shortness of breath, wheezing and stridor.  Cardiovascular: Negative for chest pain, palpitations and leg swelling.  Gastrointestinal: Negative for nausea, vomiting, abdominal pain, diarrhea, constipation, blood in stool, abdominal distention and anal bleeding.  Genitourinary: Negative for dysuria, hematuria, flank pain and difficulty urinating.  Musculoskeletal: Negative for myalgias, back pain, joint swelling, arthralgias and gait problem.  Skin: Negative for color change, pallor, rash and wound.  Neurological: Negative for dizziness, tremors, weakness and light-headedness.  Hematological: Negative for adenopathy. Does not bruise/bleed easily.  Psychiatric/Behavioral: Negative for behavioral problems, confusion, sleep disturbance, dysphoric mood, decreased concentration and agitation.       Objective:   Physical Exam Gen= a x o by 3; in NAD HEENT= [place]/at, PERRLa, EOMI, no scleral icterus. Op clear, no thrush Neck= supple, no lad Pulm= ctab, no w/c/r/ Cor= nl s1,s2,no g/m/r Abd= scaphoid, bs +, no HSM Ext = thin, no c/c/e Skin= no rash, no bruising Neuro = cn 2-12, motoro 5/5      Assessment & Plan:  MAC = continue with current treatment. We will check cbc and cmp at this visit to see if any lab abn associated with medications. Reminded patient to call us if she felt she had any worsening/blurry vision, abdominal cramping, as potential SE of medications.  Malnutrition = will continue to advocate for increase nutritional intake  Will have her return for visit in 3 months

## 2011-10-12 DIAGNOSIS — H04129 Dry eye syndrome of unspecified lacrimal gland: Secondary | ICD-10-CM | POA: Diagnosis not present

## 2011-11-01 ENCOUNTER — Other Ambulatory Visit: Payer: Self-pay | Admitting: Pulmonary Disease

## 2011-11-29 ENCOUNTER — Ambulatory Visit: Payer: Medicare Other | Admitting: Internal Medicine

## 2011-12-01 ENCOUNTER — Other Ambulatory Visit: Payer: Self-pay | Admitting: Internal Medicine

## 2011-12-06 ENCOUNTER — Ambulatory Visit (INDEPENDENT_AMBULATORY_CARE_PROVIDER_SITE_OTHER): Payer: Medicare Other | Admitting: Internal Medicine

## 2011-12-06 ENCOUNTER — Encounter: Payer: Self-pay | Admitting: Internal Medicine

## 2011-12-06 VITALS — BP 124/70 | HR 84 | Temp 98.0°F | Wt 98.0 lb

## 2011-12-06 DIAGNOSIS — A310 Pulmonary mycobacterial infection: Secondary | ICD-10-CM | POA: Diagnosis not present

## 2011-12-06 LAB — CBC WITH DIFFERENTIAL/PLATELET
Basophils Relative: 0 % (ref 0–1)
HCT: 40.6 % (ref 36.0–46.0)
Hemoglobin: 13.8 g/dL (ref 12.0–15.0)
Lymphocytes Relative: 24 % (ref 12–46)
Lymphs Abs: 1.8 10*3/uL (ref 0.7–4.0)
MCHC: 34 g/dL (ref 30.0–36.0)
Monocytes Absolute: 1.1 10*3/uL — ABNORMAL HIGH (ref 0.1–1.0)
Monocytes Relative: 14 % — ABNORMAL HIGH (ref 3–12)
Neutro Abs: 4.4 10*3/uL (ref 1.7–7.7)
Neutrophils Relative %: 59 % (ref 43–77)
RBC: 4.37 MIL/uL (ref 3.87–5.11)

## 2011-12-07 LAB — COMPLETE METABOLIC PANEL WITH GFR
Albumin: 3.8 g/dL (ref 3.5–5.2)
BUN: 18 mg/dL (ref 6–23)
CO2: 31 mEq/L (ref 19–32)
Calcium: 9.6 mg/dL (ref 8.4–10.5)
Chloride: 102 mEq/L (ref 96–112)
GFR, Est African American: 84 mL/min
GFR, Est Non African American: 73 mL/min
Glucose, Bld: 87 mg/dL (ref 70–99)
Potassium: 4.4 mEq/L (ref 3.5–5.3)
Sodium: 141 mEq/L (ref 135–145)
Total Protein: 7.2 g/dL (ref 6.0–8.3)

## 2011-12-13 DIAGNOSIS — Z1231 Encounter for screening mammogram for malignant neoplasm of breast: Secondary | ICD-10-CM | POA: Diagnosis not present

## 2011-12-21 NOTE — Progress Notes (Signed)
Infectious Diseases Clinic Note  RRV: pulm mac treamtent, last seen in April Subjective:    Patient ID: Destiny Rodriguez, female    DOB: 31-Mar-1925, 78 y.o.   MRN: [id number]  HPI [name] is an 78 yo female with history of bronchiectesis, and MAC pulmonary disease dx in 2001. Last chest ct shows nodular-cavitary lesions in Dec 2012. She was diagnosed with MAC pulmonary disease in 2001, but she thought she may have had disease prior to that she is known to have frequent pulmonary infections. Due to worsening symptoms of DOE, hemoptysis, decrease exercise tolerance, she has re-initiated treatment with clarithro ,EMB, RIF on a daily basis beginning in mid/late January.  The patient states that she is feeling she is having better energy level. She states that she has worsening sputum production in the evening. Feels that she needs to sleep iwht a few pillows to minimize symptoms of cough. Flutter valve is helping bring up phlegm. No fever/chills/nightsweats/nor hemoptysis/ stable chronic cough. Appetite good but no increase in weight, still at 96lbs.   MAC tx history:  -- in 2001 with clarithromycin and ethambutol for approximately 1 year.  --In April 2012, with clarithromycin, EMB, RIF TIW the decision was made to re-initiate treatment, due to her symptoms of hemoptysis, malaise, and weight loss.which she initiallly had improved symptoms in the first 2 months then returned back to having intermittent hemoptysis. By December 2012, she had completed 8 months of therapy and felt no different on treatment whereas off treatment. She had significant nausea, anorexia associated with taking the medications.  Current Outpatient Prescriptions on File Prior to Visit  Medication Sig Dispense Refill  . atorvastatin (LIPITOR) 10 MG tablet Take 10 mg by mouth daily.        . benzonatate (TESSALON PERLES) 100 MG capsule Take 2 capsules (200 mg total) by mouth 3 (three) times daily as needed for cough.  300 capsule   5  . BIAXIN 500 MG tablet TAKE 1 TABLET EACH DAY.  30 each  0  . calcium carbonate (OS-CAL) 600 MG TABS Take 600 mg by mouth 2 (two) times daily with a meal.        . FORADIL AEROLIZER 12 MCG capsule for inhaler PLACE 1 CAPSULE INTO INHALER AND INHALE 2 TIMES DAILY.  60 each  3  . MULTIPLE VITAMIN PO Take by mouth daily.       Marland Kitchen MYAMBUTOL 400 MG tablet TAKE 1&1/2 TABLETS ONCE DAILY.  45 each  0  . PROAIR HFA 108 (90 BASE) MCG/ACT inhaler USE 2 PUFFS EVERY 4 - 6  HOURS AS NEEDED. SHAKEWELL.  1 Inhaler  3  . Respiratory Therapy Supplies (FLUTTER) DEVI by Does not apply route. Use as directed       . rifampin (RIFADIN) 150 MG capsule TAKE 3 CAPSULES BY MOUTH DAILY.  90 capsule  0  . risedronate (ACTONEL) 35 MG tablet Take 35 mg by mouth every 7 (seven) days. with water on empty stomach, nothing by mouth or lie down for next 30 minutes.          Active Ambulatory Problems    Diagnosis Date Noted  . Pulmonary diseases due to other mycobacteria 06/07/2008  . HYPERLIPIDEMIA 03/25/2007  . SINUSITIS, CHRONIC 06/07/2008  . ALLERGIC RHINITIS 06/07/2008  . BRONCHIECTASIS 03/25/2007  . COPD, MILD 06/07/2008  . HIATAL HERNIA 03/25/2007  . Hemoptysis 11/23/2010  . Black stool 11/23/2010  . MAC (mycobacterium avium-intracellulare complex) 05/27/2011   Resolved Ambulatory Problems  Diagnosis Date Noted  . UNSPECIFIED BACTERIAL PNEUMONIA 04/01/2010  . Hemoptysis 10/15/2008  . HEMOPTYSIS UNSPECIFIED 04/01/2010   Past Medical History  Diagnosis Date  . Allergic rhinitis   . Hyperlipidemia   . Chronic sinusitis   . Bronchiectasis   . Rheumatism      Review of Systems 10 point OS reviewed and mentions any positive pertinents in hpi   Objective:   Physical Exam  BP 124/70  Pulse 84  Temp 98 F (36.7 C) (Oral)  Wt 98 lb (44.453 kg)  SpO2 95% Physical Exam  Constitutional:oriented to person, place, and time. Thin and guant. No distress.  HENT:  Mouth/Throat: Oropharynx is clear and  moist. No oropharyngeal exudate.  Cardiovascular: Normal rate, regular rhythm and normal heart sounds. Exam reveals no gallop and no friction rub.  No murmur heard.  Pulmonary/Chest: Effort normal and breath sounds normal. No respiratory distress. He has no wheezes.  Abdominal: Soft. Bowel sounds are normal.  exhibits no distension. There is no tenderness.  Lymphadenopathy: no cervical adenopathy.  Neurological: He is alert and oriented to person, place, and time.  Skin: Skin is warm and dry. No rash noted. No erythema.          Assessment & Plan:  - continue with mac treatment; will check labs today  Recommend to follow up with dr. Shelle Iron to see if any further regimens to help chronic cough. If not improved may need to get echo to see if not related to lungs.

## 2011-12-29 ENCOUNTER — Other Ambulatory Visit: Payer: Self-pay | Admitting: Internal Medicine

## 2011-12-29 DIAGNOSIS — A310 Pulmonary mycobacterial infection: Secondary | ICD-10-CM

## 2011-12-30 ENCOUNTER — Ambulatory Visit (INDEPENDENT_AMBULATORY_CARE_PROVIDER_SITE_OTHER): Payer: Medicare Other | Admitting: Pulmonary Disease

## 2011-12-30 ENCOUNTER — Encounter: Payer: Self-pay | Admitting: Pulmonary Disease

## 2011-12-30 VITALS — BP 110/70 | HR 90 | Temp 97.8°F | Ht 64.0 in | Wt 99.6 lb

## 2011-12-30 DIAGNOSIS — J449 Chronic obstructive pulmonary disease, unspecified: Secondary | ICD-10-CM

## 2011-12-30 DIAGNOSIS — A310 Pulmonary mycobacterial infection: Secondary | ICD-10-CM

## 2011-12-30 DIAGNOSIS — J479 Bronchiectasis, uncomplicated: Secondary | ICD-10-CM

## 2011-12-30 NOTE — Patient Instructions (Addendum)
Stop foradil Start dulera 100/5  2 inhalations am and pm everyday.  Rinse mouth well. Think about the vibratory vest, and let me know if you wish to try it. followup with me in 6mos.

## 2011-12-30 NOTE — Assessment & Plan Note (Signed)
The patient continues on treatment under the direction of infectious disease, and seems to be tolerating the medications well.

## 2011-12-30 NOTE — Assessment & Plan Note (Signed)
The patient overall is doing fairly well from a bronchiectasis standpoint, with no recent acute exacerbation.  She seems to have adequate mucociliary clearance with a flutter valve, but would have a very low threshold to initiate a vibratory vest.

## 2011-12-30 NOTE — Assessment & Plan Note (Signed)
The patient has known COPD associated with her bronchiectasis, and has been staying on Foradil compliantly.  She may do better on a LABA/ICS, but has not wanted to stay on this combination.  With her increasing dyspnea on exertion, I suspect it is related to progressive airflow obstruction.  She is willing to try a more aggressive medical regimen, but if she does not see a difference, I agree with a cardiac evaluation.

## 2011-12-30 NOTE — Progress Notes (Signed)
  Subjective:    Patient ID: Destiny Rodriguez, female    DOB: June 19, 1924, 78 y.o.   MRN: [id number]  HPI Patient comes in today for followup of her known bronchiectasis with chronic obstructive pulmonary disease.  She also has neck, and is currently on treatment under the direction of infectious disease.  The patient has a chronic cough as well as worsening dyspnea on exertion, but in the past she has not wanted to stay on a LABA/ICS.  She is using Foradil compliantly.  She feels that she is mobilizing her mucus fairly well, but will have issues at times.  She is using a flutter valve, but has never tried a preparatory vest.  She has not had any significant hemoptysis since the last visit, and this only occurs on an occasional basis.   Review of Systems  Constitutional: Negative for fever and unexpected weight change.  HENT: Positive for sneezing. Negative for ear pain, nosebleeds, congestion, sore throat, rhinorrhea, trouble swallowing, dental problem, postnasal drip and sinus pressure.   Eyes: Negative for redness and itching.  Respiratory: Positive for cough, chest tightness, shortness of breath and wheezing.   Cardiovascular: Negative for palpitations and leg swelling.  Gastrointestinal: Negative for nausea and vomiting.  Genitourinary: Negative for dysuria.  Musculoskeletal: Negative for joint swelling.  Skin: Negative for rash.  Neurological: Negative for headaches.  Hematological: Bruises/bleeds easily.  Psychiatric/Behavioral: Negative for dysphoric mood. The patient is not nervous/anxious.        Objective:   Physical Exam Thin female in no acute distress Nose without purulence or discharge noted No JVD or lymphadenopathy noted. Chest with a few rhonchi, adequate air flow, a few scattered crackles, no wheezing Cardiac exam with regular rate and rhythm Lower extremities without edema, cyanosis Alert and oriented, moves all 4 extremities.       Assessment & Plan:

## 2012-01-04 DIAGNOSIS — H16229 Keratoconjunctivitis sicca, not specified as Sjogren's, unspecified eye: Secondary | ICD-10-CM | POA: Diagnosis not present

## 2012-01-26 ENCOUNTER — Telehealth: Payer: Self-pay | Admitting: Pulmonary Disease

## 2012-01-26 DIAGNOSIS — J479 Bronchiectasis, uncomplicated: Secondary | ICD-10-CM

## 2012-01-26 MED ORDER — MOMETASONE FURO-FORMOTEROL FUM 100-5 MCG/ACT IN AERO: 2.0000 | INHALATION_SPRAY | Freq: Two times a day (BID) | RESPIRATORY_TRACT | Status: DC

## 2012-01-26 NOTE — Telephone Encounter (Signed)
Per rhonda put order in ass DME referral since there is not an actual order for the vest. I have placed order. Nothing further was needed--pt is aware of this

## 2012-01-26 NOTE — Telephone Encounter (Signed)
Pt informed that rx for Elwin Sleight was sent to [facility].  Pt has decided that she would like to try the vest now.  Dr Shelle Iron , please advise if this is ok to send order.

## 2012-01-26 NOTE — Telephone Encounter (Signed)
Ok to send to [facility].  Make sure it is the one from the [place] company.

## 2012-01-31 ENCOUNTER — Telehealth: Payer: Self-pay | Admitting: Pulmonary Disease

## 2012-01-31 NOTE — Telephone Encounter (Signed)
I spoke Destiny Rodriguez and advised her looked through [facility] paperwork and did not see anything on this pt. She advised me of the fax # she faxed this too which was not correct. Gave her fax # [phone] to refax this too. Will forward to Ernest to look out for this.

## 2012-02-02 NOTE — Telephone Encounter (Signed)
Spoke with [name] and informed her of KC's recs. Aware that I faxed papers back and Lawson Fiscal has original copy. [name] will contact us if this doesn't work through [employer]

## 2012-02-02 NOTE — Telephone Encounter (Signed)
Form regarding vibratory vest has been received and placed in Kc look at for review.

## 2012-02-02 NOTE — Telephone Encounter (Signed)
Sent back to triage.  If this is not good enough, will refer to a different VEST company that may be more accommodating.

## 2012-02-07 ENCOUNTER — Encounter: Payer: Self-pay | Admitting: Internal Medicine

## 2012-02-07 ENCOUNTER — Ambulatory Visit (INDEPENDENT_AMBULATORY_CARE_PROVIDER_SITE_OTHER): Payer: Medicare Other | Admitting: Internal Medicine

## 2012-02-07 VITALS — BP 121/69 | HR 89 | Temp 97.7°F | Ht 64.0 in | Wt 100.0 lb

## 2012-02-07 DIAGNOSIS — A318 Other mycobacterial infections: Secondary | ICD-10-CM

## 2012-02-07 DIAGNOSIS — Z23 Encounter for immunization: Secondary | ICD-10-CM | POA: Diagnosis not present

## 2012-02-07 DIAGNOSIS — A310 Pulmonary mycobacterial infection: Secondary | ICD-10-CM

## 2012-02-07 NOTE — Progress Notes (Signed)
ID CLINIC  RFV: follow up visit on MAC pulmonary treatment since 2013  Subjective:    Patient ID: Destiny Rodriguez, female    DOB: 07-12-1924, 78 y.o.   MRN: [id number]  HPI  Destiny Rodriguez is an 78 yo female with history of bronchiectesis, and MAC pulmonary disease dx in 2001. Last chest ct shows nodular-cavitary lesions in Dec 2012. She was diagnosed with MAC pulmonary disease in 2001, but she thought she may have had disease prior to that she is known to have frequent pulmonary infections. Due to worsening symptoms of DOE, hemoptysis, decrease exercise tolerance, she has re-initiated treatment with clarithro ,EMB, RIF on a daily basis beginning in mid/late January. Last seen in mid July as part of routine visit. She had been back to seen Dr. Shelle Iron who changed her to a different inhaled steroids, and also using vibratory vest which now she is feeling better. Less sputum production, and less cough.  She is concern that the inhaled steroids will worsen her osteoporosis. Initially took some time to adjust to the vibratory vest which she does in the morning. Shortness of breath improved, but occ fluctuating.can walk further distance without dyspnea.  Has increased her weight from 99 to 100 lb. Has good quality of life. No worsening vision. She states that she has trouble with eyesight but unable to describe, "trouble reading". Has hx of cataract surgery.she has optho eval every 3 months. Where her exam was benign. Thought to have "aging eyes".   MAC tx history:  -- in 2001 with clarithromycin and ethambutol for approximately 1 year.  --In April 2012, with clarithromycin, EMB, RIF TIW the decision was made to re-initiate treatment, due to her symptoms of hemoptysis, malaise, and weight loss.which she initiallly had improved symptoms in the first 2 months then returned back to having intermittent hemoptysis. By December 2012, she had completed 8 months of therapy and felt no different on treatment whereas  off treatment. She had significant nausea, anorexia associated with taking the medications.  -- mid Jan 2013- restarted 3 drug regimen.  Current Outpatient Prescriptions on File Prior to Visit  Medication Sig Dispense Refill  . atorvastatin (LIPITOR) 10 MG tablet Take 10 mg by mouth daily.        Marland Kitchen BIAXIN 500 MG tablet TAKE 1 TABLET EACH DAY.  30 each  3  . calcium carbonate (OS-CAL) 600 MG TABS Take 600 mg by mouth 2 (two) times daily with a meal.        . mometasone-formoterol (DULERA) 100-5 MCG/ACT AERO Inhale 2 puffs into the lungs 2 (two) times daily.  1 Inhaler  5  . MULTIPLE VITAMIN PO Take by mouth daily.       Marland Kitchen MYAMBUTOL 400 MG tablet TAKE 1&1/2 TABLETS ONCE DAILY.  45 each  3  . Respiratory Therapy Supplies (FLUTTER) DEVI by Does not apply route. Use as directed      . rifampin (RIFADIN) 150 MG capsule TAKE 3 CAPSULES BY MOUTH DAILY.  90 capsule  3  . benzonatate (TESSALON PERLES) 100 MG capsule Take 2 capsules (200 mg total) by mouth 3 (three) times daily as needed for cough.  300 capsule  5  . PROAIR HFA 108 (90 BASE) MCG/ACT inhaler USE 2 PUFFS EVERY 4 - 6  HOURS AS NEEDED. SHAKEWELL.  1 Inhaler  3  . RESTASIS 0.05 % ophthalmic emulsion        Active Ambulatory Problems    Diagnosis Date Noted  . Pulmonary diseases  due to other mycobacteria 06/07/2008  . HYPERLIPIDEMIA 03/25/2007  . SINUSITIS, CHRONIC 06/07/2008  . ALLERGIC RHINITIS 06/07/2008  . BRONCHIECTASIS 03/25/2007  . COPD, MILD 06/07/2008  . HIATAL HERNIA 03/25/2007  . Hemoptysis 11/23/2010  . Black stool 11/23/2010  . MAC (mycobacterium avium-intracellulare complex) 05/27/2011   Resolved Ambulatory Problems    Diagnosis Date Noted  . UNSPECIFIED BACTERIAL PNEUMONIA 04/01/2010  . Hemoptysis 10/15/2008  . HEMOPTYSIS UNSPECIFIED 04/01/2010   Past Medical History  Diagnosis Date  . Allergic rhinitis   . Hyperlipidemia   . Chronic sinusitis   . Bronchiectasis   . Rheumatism      Review of Systems 10  point of review otherwise negative, reviewed, positive pertinents listed in hpi.     Objective:   Physical Exam  BP 121/69  Pulse 89  Temp 97.7 F (36.5 C) (Oral)  Ht 5\' 4"  (1.626 m)  Wt 100 lb (45.36 kg)  BMI 17.17 kg/m2 Physical Exam  Constitutional:  oriented to person, place, and time.  appears well-developed and thin at baseline but below her baseline of 120. No distress.  HENT: [place]/AT, PERRLA, EOMI, no scleral icterus Mouth/Throat: Oropharynx is clear and moist. No oropharyngeal exudate.  Cardiovascular: Normal rate, regular rhythm and normal heart sounds. Exam reveals no gallop and no friction rub.  No murmur heard.  Pulmonary/Chest: Effort normal and breath sounds normal. No respiratory distress. He has no wheezes.  Lymphadenopathy:  no cervical adenopathy.  Neurological: He is alert and oriented to person, place, and time.  Skin: Skin is warm and dry. No rash noted. No erythema.  BMET    Component Value Date/Time   NA 141 12/06/2011 1607   K 4.4 12/06/2011 1607   CL 102 12/06/2011 1607   CO2 31 12/06/2011 1607   GLUCOSE 87 12/06/2011 1607   BUN 18 12/06/2011 1607   CREATININE 0.75 12/06/2011 1607   CALCIUM 9.6 12/06/2011 1607    CBC    Component Value Date/Time   WBC 7.5 12/06/2011 1607   RBC 4.37 12/06/2011 1607   HGB 13.8 12/06/2011 1607   HCT 40.6 12/06/2011 1607   PLT 226 12/06/2011 1607   MCV 92.9 12/06/2011 1607   MCH 31.6 12/06/2011 1607   MCHC 34.0 12/06/2011 1607   RDW 13.3 12/06/2011 1607   LYMPHSABS 1.8 12/06/2011 1607   MONOABS 1.1* 12/06/2011 1607   EOSABS 0.3 12/06/2011 1607   BASOSABS 0.0 12/06/2011 1607       Assessment & Plan:  MAC = will continue with her 3 drug regimen. Will check CBC and CMP today.   COPD= currently on a good regimen as prescribed by Dr. Shelle Iron.  Possible drug interaction between lipitor and clarithromycin = will discontinue lipitor. She is not having any symptoms, but would like to minimize risk. Can switch to pravachol if she still  needs lipid lowering agent. Plan is to stop lipitor now. And recheck lipids in 4-6 wks. off of lipitor to see if she still needs pravachol.  Blurry vision = had a ophtho visit a month ago that was normal. Will review findings with dr. Elise Benne. ? emb toxicity. May need a repeat visit  Health maintenance = will give flu vax at this visit  C: dr. Shelle Iron, dr. Pete Glatter

## 2012-02-08 LAB — COMPREHENSIVE METABOLIC PANEL
BUN: 19 mg/dL (ref 6–23)
CO2: 34 mEq/L — ABNORMAL HIGH (ref 19–32)
Calcium: 9.5 mg/dL (ref 8.4–10.5)
Chloride: 104 mEq/L (ref 96–112)
Creat: 0.71 mg/dL (ref 0.50–1.10)
Glucose, Bld: 99 mg/dL (ref 70–99)

## 2012-02-08 LAB — CBC WITH DIFFERENTIAL/PLATELET
Basophils Absolute: 0 10*3/uL (ref 0.0–0.1)
Basophils Relative: 0 % (ref 0–1)
HCT: 41.6 % (ref 36.0–46.0)
Lymphocytes Relative: 24 % (ref 12–46)
MCHC: 33.7 g/dL (ref 30.0–36.0)
Monocytes Absolute: 0.9 10*3/uL (ref 0.1–1.0)
Neutro Abs: 4.8 10*3/uL (ref 1.7–7.7)
Platelets: 250 10*3/uL (ref 150–400)
RDW: 13.2 % (ref 11.5–15.5)
WBC: 7.8 10*3/uL (ref 4.0–10.5)

## 2012-03-01 ENCOUNTER — Encounter: Payer: Self-pay | Admitting: *Deleted

## 2012-03-01 ENCOUNTER — Telehealth: Payer: Self-pay | Admitting: Pulmonary Disease

## 2012-03-01 NOTE — Telephone Encounter (Signed)
Pt states she was started on Vibra Vest and instructed to use twice daily for 30 minutes at 60% pressure. She cut this back to once daily because it bothered her hiatal hernia. She has also had two episodes where she coughed up about 1 tsp of bright red blood and said this was alarming to her. She stopped using the vest on 02/21/12 and has not seen any more blood. She wants to know if she should wear the vest for less time or if the pressure should be adjusted. She denies any increaed sob or pain. Pls advise.

## 2012-03-01 NOTE — Telephone Encounter (Signed)
Called and spoke with patient, informed patient of recs per Dr. Shelle Iron as listed below. Patient verbalized understanding aand nothing further needed at this time.

## 2012-03-01 NOTE — Telephone Encounter (Signed)
The vest is not causing her hemoptysis. Can try decreasing pressure to 50% and see if that helps her reflux symptoms.  Need to stay on twice a day if possible.

## 2012-03-07 DIAGNOSIS — Z1331 Encounter for screening for depression: Secondary | ICD-10-CM | POA: Diagnosis not present

## 2012-03-07 DIAGNOSIS — Z79899 Other long term (current) drug therapy: Secondary | ICD-10-CM | POA: Diagnosis not present

## 2012-03-07 DIAGNOSIS — E7800 Pure hypercholesterolemia, unspecified: Secondary | ICD-10-CM | POA: Diagnosis not present

## 2012-03-07 DIAGNOSIS — Z0000 Encounter for general adult medical examination without abnormal findings: Secondary | ICD-10-CM | POA: Diagnosis not present

## 2012-03-09 ENCOUNTER — Telehealth: Payer: Self-pay | Admitting: Pulmonary Disease

## 2012-03-09 NOTE — Telephone Encounter (Signed)
lmomtcb x1 for sara 

## 2012-03-09 NOTE — Telephone Encounter (Signed)
Spoke with Destiny Rodriguez and she is resending the forms will give to [facility] to view once i get them

## 2012-03-09 NOTE — Telephone Encounter (Signed)
LMTCB

## 2012-03-09 NOTE — Telephone Encounter (Signed)
[name] returned call from triage. Destiny Rodriguez

## 2012-03-13 DIAGNOSIS — E7800 Pure hypercholesterolemia, unspecified: Secondary | ICD-10-CM | POA: Diagnosis not present

## 2012-03-13 NOTE — Telephone Encounter (Signed)
DOCUMENTS SIGNED AND FAXED TO [employer] AT [phone] SPOKE WITH SARAH  NOTHING FURTHER WAS NEEDED

## 2012-03-14 NOTE — Telephone Encounter (Signed)
Per Nicholos Johns made in error

## 2012-04-04 DIAGNOSIS — H16229 Keratoconjunctivitis sicca, not specified as Sjogren's, unspecified eye: Secondary | ICD-10-CM | POA: Diagnosis not present

## 2012-04-06 ENCOUNTER — Encounter: Payer: Self-pay | Admitting: Internal Medicine

## 2012-04-06 ENCOUNTER — Ambulatory Visit (INDEPENDENT_AMBULATORY_CARE_PROVIDER_SITE_OTHER): Payer: Medicare Other | Admitting: Internal Medicine

## 2012-04-06 VITALS — BP 135/72 | HR 86 | Temp 97.3°F | Ht 64.0 in | Wt 101.2 lb

## 2012-04-06 DIAGNOSIS — A318 Other mycobacterial infections: Secondary | ICD-10-CM

## 2012-04-06 DIAGNOSIS — A310 Pulmonary mycobacterial infection: Secondary | ICD-10-CM

## 2012-04-06 LAB — CBC WITH DIFFERENTIAL/PLATELET
Basophils Absolute: 0 10*3/uL (ref 0.0–0.1)
HCT: 41.7 % (ref 36.0–46.0)
Hemoglobin: 14.4 g/dL (ref 12.0–15.0)
Lymphocytes Relative: 21 % (ref 12–46)
Monocytes Absolute: 1.1 10*3/uL — ABNORMAL HIGH (ref 0.1–1.0)
Neutro Abs: 4.5 10*3/uL (ref 1.7–7.7)
RDW: 13.4 % (ref 11.5–15.5)
WBC: 7.3 10*3/uL (ref 4.0–10.5)

## 2012-04-06 LAB — COMPREHENSIVE METABOLIC PANEL
ALT: 14 U/L (ref 0–35)
AST: 17 U/L (ref 0–37)
Albumin: 3.9 g/dL (ref 3.5–5.2)
BUN: 16 mg/dL (ref 6–23)
Calcium: 9.6 mg/dL (ref 8.4–10.5)
Chloride: 100 mEq/L (ref 96–112)
Potassium: 4.8 mEq/L (ref 3.5–5.3)

## 2012-04-06 NOTE — Progress Notes (Signed)
ID CLINIC FOLLOW UP  RFV:  Subjective:    Patient ID: Destiny Rodriguez, female    DOB: 02-08-1925, 78 y.o.   MRN: [id number]  HPI Ms. Stockard is an 78 yo female with history of bronchiectesis, and MAC pulmonary disease dx in 2001. Last chest ct shows nodular-cavitary lesions in Dec 2012. She was diagnosed with MAC pulmonary disease in 2001, but she thought she may have had disease prior to that she is known to have frequent pulmonary infections. Due to worsening symptoms of DOE, hemoptysis, decrease exercise tolerance, she has re-initiated treatment with clarithro ,EMB, RIF on a daily basis beginning in mid/late January.    She reports doing well, especially having improvement at night with use of dulara. At the  End of September had episode of hemoptysis, coughed up a clot and noticed having some bleeding that lasted a week but then resolved. Overall ,Has good quality of life. She had repeat eye evaluation last week, which she has been doing every 3 months while on EMB  ROS= Occasionally gets "angry pimple" one lesion every month, uses bactroban to treat it  MAC tx history:  -- in 2001 with clarithromycin and ethambutol for approximately 1 year.  --In April 2012, with clarithromycin, EMB, RIF TIW the decision was made to re-initiate treatment, due to her symptoms of hemoptysis, malaise, and weight loss.which she initiallly had improved symptoms in the first 2 months then returned back to having intermittent hemoptysis. By December 2012, she had completed 8 months of therapy and felt no different on treatment whereas off treatment. She had significant nausea, anorexia associated with taking the medications.  -- mid Jan 2013- restarted 3 drug regimen.  Current Outpatient Prescriptions on File Prior to Visit  Medication Sig Dispense Refill  . BIAXIN 500 MG tablet TAKE 1 TABLET EACH DAY.  30 each  3  . calcium carbonate (OS-CAL) 600 MG TABS Take 600 mg by mouth 2 (two) times daily with a meal.         . mometasone-formoterol (DULERA) 100-5 MCG/ACT AERO Inhale 2 puffs into the lungs 2 (two) times daily.  1 Inhaler  5  . MULTIPLE VITAMIN PO Take by mouth daily.       Marland Kitchen MYAMBUTOL 400 MG tablet TAKE 1&1/2 TABLETS ONCE DAILY.  45 each  3  . PROAIR HFA 108 (90 BASE) MCG/ACT inhaler USE 2 PUFFS EVERY 4 - 6  HOURS AS NEEDED. SHAKEWELL.  1 Inhaler  3  . Respiratory Therapy Supplies (FLUTTER) DEVI by Does not apply route. Use as directed      . rifampin (RIFADIN) 150 MG capsule TAKE 3 CAPSULES BY MOUTH DAILY.  90 capsule  3   Active Ambulatory Problems    Diagnosis Date Noted  . Pulmonary diseases due to other mycobacteria 06/07/2008  . HYPERLIPIDEMIA 03/25/2007  . SINUSITIS, CHRONIC 06/07/2008  . ALLERGIC RHINITIS 06/07/2008  . BRONCHIECTASIS 03/25/2007  . COPD, MILD 06/07/2008  . HIATAL HERNIA 03/25/2007  . Hemoptysis 11/23/2010  . Black stool 11/23/2010  . MAC (mycobacterium avium-intracellulare complex) 05/27/2011   Resolved Ambulatory Problems    Diagnosis Date Noted  . UNSPECIFIED BACTERIAL PNEUMONIA 04/01/2010  . Hemoptysis 10/15/2008  . HEMOPTYSIS UNSPECIFIED 04/01/2010   Past Medical History  Diagnosis Date  . Allergic rhinitis   . Hyperlipidemia   . Chronic sinusitis   . Bronchiectasis   . Rheumatism     Review of Systems 10 point reviewed, otherwise negative    Objective:   Physical Exam  BP 135/72  Pulse 86  Temp 97.3 F (36.3 C) (Oral)  Ht 5\' 4"  (1.626 m)  Wt 101 lb 4 oz (45.927 kg)  BMI 17.38 kg/m2 Physical Exam  Constitutional:  oriented to person, place, and time. Thin and cachetic. No distress.  HENT:  Mouth/Throat: Oropharynx is clear and moist. No oropharyngeal exudate.  Cardiovascular: Normal rate, regular rhythm and normal heart sounds. Exam reveals no gallop and no friction rub.  No murmur heard.  Pulmonary/Chest: Effort normal and breath sounds normal. No respiratory distress. He has no wheezes.  Abdominal: Soft. Bowel sounds are normal. He  exhibits no distension. There is no tenderness.  Lymphadenopathy:  no cervical adenopathy.   Skin: Skin is warm and dry. No rash noted. No erythema.      Assessment & Plan:  MAC= continue on three drug therapy with rifampin, ethambutol and clarithromycin. Will check cbc, cmp today. Can space out eyes exams to every 4-27months  Underweight= continue to supplement diet with ensure  rtc in 10 wks

## 2012-04-27 ENCOUNTER — Other Ambulatory Visit: Payer: Self-pay | Admitting: Internal Medicine

## 2012-06-19 ENCOUNTER — Encounter: Payer: Self-pay | Admitting: Internal Medicine

## 2012-06-19 ENCOUNTER — Ambulatory Visit (INDEPENDENT_AMBULATORY_CARE_PROVIDER_SITE_OTHER): Payer: Medicare Other | Admitting: Internal Medicine

## 2012-06-19 VITALS — BP 121/70 | HR 86 | Temp 97.9°F | Ht 64.0 in | Wt 103.0 lb

## 2012-06-19 DIAGNOSIS — A318 Other mycobacterial infections: Secondary | ICD-10-CM

## 2012-06-19 DIAGNOSIS — A310 Pulmonary mycobacterial infection: Secondary | ICD-10-CM

## 2012-06-19 LAB — CBC WITH DIFFERENTIAL/PLATELET
Basophils Absolute: 0 10*3/uL (ref 0.0–0.1)
Basophils Relative: 0 % (ref 0–1)
Hemoglobin: 14.5 g/dL (ref 12.0–15.0)
MCHC: 34.4 g/dL (ref 30.0–36.0)
Neutro Abs: 6.2 10*3/uL (ref 1.7–7.7)
Neutrophils Relative %: 68 % (ref 43–77)
Platelets: 246 10*3/uL (ref 150–400)
RDW: 13.3 % (ref 11.5–15.5)

## 2012-06-19 LAB — COMPREHENSIVE METABOLIC PANEL
AST: 14 U/L (ref 0–37)
Albumin: 3.7 g/dL (ref 3.5–5.2)
Alkaline Phosphatase: 92 U/L (ref 39–117)
Potassium: 4.5 mEq/L (ref 3.5–5.3)
Sodium: 140 mEq/L (ref 135–145)
Total Protein: 6.9 g/dL (ref 6.0–8.3)

## 2012-06-19 NOTE — Progress Notes (Signed)
[facility] CL[facility] NOTE  RFV: follow up for MAC pulmonary disease Subjective:    Patient ID: Destiny Rodriguez, female    DOB: 05-13-1925, 79 y.o.   MRN: [id number]  HPI Ms. Hohman is an 79 yo female with history of bronchiectesis, and MAC pulmonary disease dx in 2001. Last chest ct shows nodular-cavitary lesions in Dec 2012. She was diagnosed with MAC pulmonary disease in 2001, but she thought she may have had disease prior to that she is known to have frequent pulmonary infections. Due to worsening symptoms of DOE, hemoptysis, decrease exercise tolerance, she has re-initiated treatment with clarithro ,EMB, RIF on a daily basis beginning in mid/late January.   She reports doing well, especially having improvement at night with use of dulara and vibratory vest. No difficulty with vision.  Feeling better for the first time in 3 years. Sleeping better. Still cough spells 2-3 per day, to clear out phlegm. Sleeps well at night. Nervous tic with cough at times.  Current Outpatient Prescriptions on File Prior to Visit  Medication Sig Dispense Refill  . calcium carbonate (OS-CAL) 600 MG TABS Take 600 mg by mouth 2 (two) times daily with a meal.        . clarithromycin (BIAXIN) 500 MG tablet TAKE 1 TABLET EACH DAY.  30 tablet  2  . ethambutol (MYAMBUTOL) 400 MG tablet TAKE 1&1/2 TABLETS ONCE DAILY.  45 tablet  2  . mometasone-formoterol (DULERA) 100-5 MCG/ACT AERO Inhale 2 puffs into the lungs 2 (two) times daily.  1 Inhaler  5  . MULTIPLE VITAMIN PO Take by mouth daily.       Marland Kitchen PROAIR HFA 108 (90 BASE) MCG/ACT inhaler USE 2 PUFFS EVERY 4 - 6  HOURS AS NEEDED. SHAKEWELL.  1 Inhaler  3  . rifampin (RIFADIN) 150 MG capsule TAKE 3 CAPSULES BY MOUTH DAILY.  90 capsule  2      Review of Systems 10 point ROS is negative    Objective:   Physical Exam BP 121/70  Pulse 86  Temp 97.9 F (36.6 C) (Oral)  Ht 5\' 4"  (1.626 m)  Wt 103 lb (46.72 kg)  BMI 17.68 kg/m2 (up 2 lb since last visit) Physical Exam    Constitutional:  oriented to person, place, and time.appears well-developed and well-nourished. No distress.  HENT:  Mouth/Throat: Oropharynx is clear and moist. No oropharyngeal exudate.  Cardiovascular: Normal rate, regular rhythm and normal heart sounds. Exam reveals no gallop and no friction rub.  No murmur heard.  Pulmonary/Chest: Effort normal and breath sounds normal. No respiratory distress.  no wheezes.  Lymphadenopathy:  no cervical adenopathy.  Skin: Skin is warm and dry. No rash noted. No erythema.         Assessment & Plan:  Pulmonary MAC = continue with 3 drug therapy at the 12 month mark. Will extend to 18 months and discuss discontinuation. Patient feeling well at this time and has minimal symptoms. Tolerating medications.  rtc in 4 months

## 2012-07-03 ENCOUNTER — Ambulatory Visit (INDEPENDENT_AMBULATORY_CARE_PROVIDER_SITE_OTHER): Payer: Medicare Other | Admitting: Pulmonary Disease

## 2012-07-03 ENCOUNTER — Encounter: Payer: Self-pay | Admitting: Pulmonary Disease

## 2012-07-03 VITALS — BP 104/60 | HR 86 | Temp 97.7°F | Ht 64.0 in | Wt 104.6 lb

## 2012-07-03 DIAGNOSIS — J479 Bronchiectasis, uncomplicated: Secondary | ICD-10-CM

## 2012-07-03 DIAGNOSIS — J449 Chronic obstructive pulmonary disease, unspecified: Secondary | ICD-10-CM

## 2012-07-03 DIAGNOSIS — A310 Pulmonary mycobacterial infection: Secondary | ICD-10-CM

## 2012-07-03 DIAGNOSIS — J4489 Other specified chronic obstructive pulmonary disease: Secondary | ICD-10-CM

## 2012-07-03 NOTE — Assessment & Plan Note (Signed)
The patient has had a very good response to delay her, and would like to maintain on this medication.  She is staying active with very little dyspnea on exertion.

## 2012-07-03 NOTE — Assessment & Plan Note (Signed)
The patient is maintaining on her medications for MAC, and the plan is to extend her course another 6 months.

## 2012-07-03 NOTE — Assessment & Plan Note (Signed)
The patient is doing well from a bronchiectasis standpoint, and she is maintaining good mucous ciliary clearance with her vibratory vest.  She has not had a flareup with usual bacterial pathogens since the last visit.

## 2012-07-03 NOTE — Patient Instructions (Addendum)
No change in medications Stay as active as possible. followup with me in 6mos.  

## 2012-07-03 NOTE — Progress Notes (Signed)
  Subjective:    Patient ID: Destiny Rodriguez, female    DOB: 10-19-1924, 79 y.o.   MRN: [id number]  HPI The patient comes in today for followup of her known bronchiectasis complicated by COPD and MAC.  She has done very well on her dulera and vibratory vest, and feels that her cough and secretions are well controlled.  She continues on her antibiotics for MAC under the direction of infectious disease.  They are going to extend her course another 6 months.   Review of Systems  Constitutional: Negative for fever and unexpected weight change.  HENT: Positive for congestion, rhinorrhea and postnasal drip. Negative for ear pain, nosebleeds, sore throat, sneezing, trouble swallowing, dental problem and sinus pressure.   Eyes: Negative for redness and itching.  Respiratory: Positive for cough, shortness of breath and wheezing. Negative for chest tightness.        No increase in symptoms   Cardiovascular: Negative for palpitations and leg swelling.  Gastrointestinal: Negative for nausea and vomiting.  Genitourinary: Negative for dysuria.  Musculoskeletal: Negative for joint swelling.  Skin: Negative for rash.  Neurological: Negative for headaches.  Hematological: Does not bruise/bleed easily.  Psychiatric/Behavioral: Negative for dysphoric mood. The patient is not nervous/anxious.        Objective:   Physical Exam Thin female in no acute distress Nose with purulent discharge noted Neck without lymphadenopathy or thyromegaly Chest with a few scattered crackles, but overall clear with no wheezing Cardiac exam is regular rate and rhythm Lower extremities without edema, cyanosis Alert and oriented, moves all 4 extremities.       Assessment & Plan:

## 2012-07-10 DIAGNOSIS — H16229 Keratoconjunctivitis sicca, not specified as Sjogren's, unspecified eye: Secondary | ICD-10-CM | POA: Diagnosis not present

## 2012-07-27 ENCOUNTER — Other Ambulatory Visit: Payer: Self-pay | Admitting: Internal Medicine

## 2012-07-27 ENCOUNTER — Other Ambulatory Visit: Payer: Self-pay | Admitting: Pulmonary Disease

## 2012-09-05 DIAGNOSIS — M810 Age-related osteoporosis without current pathological fracture: Secondary | ICD-10-CM | POA: Diagnosis not present

## 2012-09-05 DIAGNOSIS — R05 Cough: Secondary | ICD-10-CM | POA: Diagnosis not present

## 2012-10-11 DIAGNOSIS — M810 Age-related osteoporosis without current pathological fracture: Secondary | ICD-10-CM | POA: Diagnosis not present

## 2012-10-17 ENCOUNTER — Ambulatory Visit (INDEPENDENT_AMBULATORY_CARE_PROVIDER_SITE_OTHER): Payer: Medicare Other | Admitting: Internal Medicine

## 2012-10-17 ENCOUNTER — Encounter: Payer: Self-pay | Admitting: Internal Medicine

## 2012-10-17 VITALS — BP 152/83 | HR 99 | Temp 98.1°F | Wt 103.0 lb

## 2012-10-17 DIAGNOSIS — A318 Other mycobacterial infections: Secondary | ICD-10-CM

## 2012-10-17 DIAGNOSIS — IMO0001 Reserved for inherently not codable concepts without codable children: Secondary | ICD-10-CM

## 2012-10-17 DIAGNOSIS — A310 Pulmonary mycobacterial infection: Secondary | ICD-10-CM

## 2012-10-17 LAB — CBC WITH DIFFERENTIAL/PLATELET
Basophils Absolute: 0 10*3/uL (ref 0.0–0.1)
Basophils Relative: 0 % (ref 0–1)
MCHC: 33.8 g/dL (ref 30.0–36.0)
Neutro Abs: 7 10*3/uL (ref 1.7–7.7)
Neutrophils Relative %: 67 % (ref 43–77)
RDW: 13.2 % (ref 11.5–15.5)

## 2012-10-17 LAB — COMPREHENSIVE METABOLIC PANEL
ALT: 12 U/L (ref 0–35)
AST: 15 U/L (ref 0–37)
Albumin: 3.5 g/dL (ref 3.5–5.2)
Alkaline Phosphatase: 94 U/L (ref 39–117)
Potassium: 4.1 mEq/L (ref 3.5–5.3)
Sodium: 138 mEq/L (ref 135–145)
Total Protein: 6.7 g/dL (ref 6.0–8.3)

## 2012-10-17 MED ORDER — ETHAMBUTOL HCL 400 MG PO TABS: 800.0000 mg | ORAL_TABLET | Freq: Every day | ORAL | Status: DC

## 2012-10-17 MED ORDER — CLARITHROMYCIN 500 MG PO TABS: 500.0000 mg | ORAL_TABLET | Freq: Two times a day (BID) | ORAL | Status: DC

## 2012-10-17 NOTE — Progress Notes (Signed)
[facility] CL[facility] NOTE  RFV: MAC pulmonary disease, follow up at treatment 16 months. Subjective:    Patient ID: Destiny Rodriguez, female    DOB: 17-Jan-1925, 79 y.o.   MRN: [id number]  HPI  Ms. Freund is an 79 yo female with history of bronchiectesis, and MAC pulmonary disease dx in 2001. Last chest ct shows nodular-cavitary lesions in Dec 2012. She was diagnosed with MAC pulmonary disease in 2001, but she thought she may have had disease prior to that she is known to have frequent pulmonary infections. Due to worsening symptoms of DOE, hemoptysis, decrease exercise tolerance, she has re-initiated treatment with clarithro ,EMB, RIF on a daily basis beginning in mid/late January.   Quick onset of fatigue, muscle aches over the last 2-3 days. bialteral ankle, knees. Difficult to hold things. Difficulty getting up from seated position. Feeling like she is getting better. Arms do not hurt as much but still both back of legs are achy  Active Ambulatory Problems    Diagnosis Date Noted  . Pulmonary diseases due to other mycobacteria 06/07/2008  . HYPERLIPIDEMIA 03/25/2007  . SINUSITIS, CHRONIC 06/07/2008  . ALLERGIC RHINITIS 06/07/2008  . BRONCHIECTASIS 03/25/2007  . COPD, MILD 06/07/2008  . HIATAL HERNIA 03/25/2007  . Black stool 11/23/2010  . MAC (mycobacterium avium-intracellulare complex) 05/27/2011   Resolved Ambulatory Problems    Diagnosis Date Noted  . UNSPECIFIED BACTERIAL PNEUMONIA 04/01/2010  . Hemoptysis 10/15/2008  . HEMOPTYSIS UNSPECIFIED 04/01/2010  . Hemoptysis 11/23/2010   Past Medical History  Diagnosis Date  . Allergic rhinitis   . Hyperlipidemia   . Chronic sinusitis   . Bronchiectasis   . Rheumatism    History   Social History  . Marital Status: Married    Spouse Name: N/A    Number of Children: N/A  . Years of Education: N/A   Occupational History  . Retired    Social History Main Topics  . Smoking status: Never Smoker   . Smokeless tobacco: Never Used  .  Alcohol Use: No  . Drug Use: No  . Sexually Active: Not on file   Other Topics Concern  . Not on file   Social History Narrative   Children   No pets         family history includes Asthma in her father; Breast cancer in an unspecified family member; Clotting disorder in her mother; Emphysema in her father; and Heart disease in her father. Review of Systems  Constitutional: Negative for fever, chills, diaphoresis, activity change, appetite change, fatigue and unexpected weight change.  HENT: Negative for congestion, sore throat, rhinorrhea, sneezing, trouble swallowing and sinus pressure.  Eyes: Negative for photophobia and visual disturbance.  Respiratory: Negative for cough, chest tightness, shortness of breath, wheezing and stridor.  Cardiovascular: Negative for chest pain, palpitations and leg swelling.  Gastrointestinal: still has indigestion.  Genitourinary: Negative for dysuria, hematuria, flank pain and difficulty urinating.  Musculoskeletal: Negative for myalgias, back pain, joint swelling, arthralgias and gait problem.  Skin: Negative for color change, pallor, rash and wound.  Neurological: Negative for dizziness, tremors, weakness and light-headedness.  Hematological: Negative for adenopathy. Does not bruise/bleed easily.  Psychiatric/Behavioral: Negative for behavioral problems, confusion, sleep disturbance, dysphoric mood, decreased concentration and agitation.        Objective:   Physical Exam BP 152/83  Pulse 99  Temp(Src) 98.1 F (36.7 C) (Oral)  Wt 103 lb (46.72 kg)  BMI 17.67 kg/m2 Physical Exam  Constitutional:  oriented to person, place, and time.  appears well-developed and well-nourished. No distress.  HENT:  Mouth/Throat: Oropharynx is clear and moist. No oropharyngeal exudate.  Cardiovascular: Normal rate, regular rhythm and normal heart sounds. Exam reveals no gallop and no friction rub.  No murmur heard.  Pulmonary/Chest: Effort normal and  breath sounds normal. No respiratory distress.  has no wheezes.  Abdominal: Soft. Bowel sounds are normal.  exhibits no distension. There is no tenderness.  Lymphadenopathy:  no cervical adenopathy.  Neurological:  alert and oriented to person, place, and time. Motor 5/5 in all extremities Skin: Skin is warm and dry. No rash noted. No erythema.  Psychiatric:  a normal mood and affect.  behavior is normal.      Assessment & Plan:  MAC bronchiectatic pulmonary disease = currently on 16 month of clarithromycin and ethambutol and rifampin 300mg  BID  Therapeutic drug monitoring/SE = will check CMP  Myalgias = will check cbc with diff, sed rate and CK.  Differential could include polymyagia rheumatica  rtc 4 wk  Addendum: patients symptoms are worse. We will temporarily hold MAC treatment to see if this could be medication induced. She is to call back on 6/2-6/3 for an update on symptoms. If it still persists, may need further work up.

## 2012-10-26 ENCOUNTER — Encounter: Payer: Self-pay | Admitting: Internal Medicine

## 2012-10-27 ENCOUNTER — Telehealth: Payer: Self-pay | Admitting: *Deleted

## 2012-10-27 NOTE — Telephone Encounter (Signed)
FYI, patient will call after seeing Dr. Chilton Si

## 2012-11-02 DIAGNOSIS — M810 Age-related osteoporosis without current pathological fracture: Secondary | ICD-10-CM | POA: Diagnosis not present

## 2012-11-02 DIAGNOSIS — IMO0001 Reserved for inherently not codable concepts without codable children: Secondary | ICD-10-CM | POA: Diagnosis not present

## 2012-11-28 ENCOUNTER — Ambulatory Visit: Payer: Medicare Other | Admitting: Internal Medicine

## 2012-12-18 ENCOUNTER — Ambulatory Visit (INDEPENDENT_AMBULATORY_CARE_PROVIDER_SITE_OTHER): Payer: Medicare Other | Admitting: Adult Health

## 2012-12-18 ENCOUNTER — Ambulatory Visit (INDEPENDENT_AMBULATORY_CARE_PROVIDER_SITE_OTHER)
Admission: RE | Admit: 2012-12-18 | Discharge: 2012-12-18 | Disposition: A | Discharge status: Home / Self Care | Payer: Medicare Other | Source: Ambulatory Visit | Attending: Adult Health | Admitting: Adult Health

## 2012-12-18 ENCOUNTER — Encounter: Payer: Self-pay | Admitting: Adult Health

## 2012-12-18 VITALS — BP 108/60 | HR 80 | Temp 98.9°F | Ht 64.0 in | Wt 103.0 lb

## 2012-12-18 DIAGNOSIS — A310 Pulmonary mycobacterial infection: Secondary | ICD-10-CM

## 2012-12-18 DIAGNOSIS — R042 Hemoptysis: Secondary | ICD-10-CM

## 2012-12-18 DIAGNOSIS — J479 Bronchiectasis, uncomplicated: Secondary | ICD-10-CM | POA: Diagnosis not present

## 2012-12-18 MED ORDER — AMOXICILLIN-POT CLAVULANATE 875-125 MG PO TABS: 1.0000 | ORAL_TABLET | Freq: Two times a day (BID) | ORAL | Status: AC

## 2012-12-18 NOTE — Assessment & Plan Note (Signed)
Exacerbation complicated by underlying MAC  cxr with new area on left  Will extend abx and check CT chest   Plan  Extend Augmentin for additional 7 days (total of 14days )  Continue on Vest therapy.  We are setting you up for CT chest  follow up Dr. Shelle Iron as planned in 2-3 weeks and As needed   Please contact office for sooner follow up if symptoms do not improve or worsen or seek emergency care

## 2012-12-18 NOTE — Progress Notes (Signed)
  Subjective:    Patient ID: Destiny Rodriguez, female    DOB: [birth year], 79 y.o.   MRN: [id number]  HPI 79  yo female with history of known hx of bronchiectesis, and MAC pulmonary disease dx in 2001 CT chest 04/2011: large cavitary disease superimposed over bronchiectasis.  Started on MAC meds per ID 05/2011 Had to stop MAC meds 2 months ago d/t severe diffuse muscle weakness. Resolved off therapy.  Complains of hemoptysis x3days - BRB this morning mixed with clear mucus. Mucus has been blood tinged mixed with clear mucus  with no  frank hemoptysis. Was called in  Augmentin on 7/26.  Does feel some better. No chest pain, orthopnea, edema , syncope, fever . Appetite is good with no n/v.  CXR today shows 2 new, ill-defined masses within the left mid to upper lung zone.       Review of Systems Constitutional:   No  weight loss, night sweats,  Fevers, chills, fatigue, or  lassitude.  HEENT:   No headaches,  Difficulty swallowing,  Tooth/dental problems, or  Sore throat,                No sneezing, itching, ear ache, nasal congestion, post nasal drip,   CV:  No chest pain,  Orthopnea, PND, swelling in lower extremities, anasarca, dizziness, palpitations, syncope.   GI  No heartburn, indigestion, abdominal pain, nausea, vomiting, diarrhea, change in bowel habits, loss of appetite, bloody stools.   Resp:    No chest wall deformity  Skin: no rash or lesions.  GU: no dysuria, change in color of urine, no urgency or frequency.  No flank pain, no hematuria   MS:  .  No back pain.  Psych:  No change in mood or affect. No depression or anxiety.  No memory loss.         Objective:   Physical Exam GEN: A/Ox3; pleasant , NAD, thin female   HEENT:  [place]/AT,  EACs-clear, TMs-wnl, NOSE-clear, THROAT-clear, no lesions, no postnasal drip or exudate noted.   NECK:  Supple w/ fair ROM; no JVD; normal carotid impulses w/o bruits; no thyromegaly or nodules palpated; no lymphadenopathy.  RESP   Faint rhonchi , no accessory muscle use, no dullness to percussion  CARD:  RRR, no m/r/g  , no peripheral edema, pulses intact, no cyanosis or clubbing.  GI:   Soft & nt; nml bowel sounds; no organomegaly or masses detected.  Musco: Warm bil, no deformities or joint swelling noted.   Neuro: alert, no focal deficits noted.    Skin: Warm, no lesions or rashes         Assessment & Plan:

## 2012-12-18 NOTE — Patient Instructions (Addendum)
Extend Augmentin for additional 7 days (total of 14days )  Continue on Vest therapy.  We are setting you up for CT chest  follow up Dr. Shelle Iron as planned in 2-3 weeks and As needed   Please contact office for sooner follow up if symptoms do not improve or worsen or seek emergency care

## 2012-12-20 ENCOUNTER — Ambulatory Visit (INDEPENDENT_AMBULATORY_CARE_PROVIDER_SITE_OTHER)
Admission: RE | Admit: 2012-12-20 | Discharge: 2012-12-20 | Disposition: A | Discharge status: Home / Self Care | Payer: Medicare Other | Source: Ambulatory Visit | Attending: Adult Health | Admitting: Adult Health

## 2012-12-20 DIAGNOSIS — R042 Hemoptysis: Secondary | ICD-10-CM | POA: Diagnosis not present

## 2012-12-20 DIAGNOSIS — J984 Other disorders of lung: Secondary | ICD-10-CM | POA: Diagnosis not present

## 2012-12-22 ENCOUNTER — Encounter: Payer: Self-pay | Admitting: Adult Health

## 2013-01-01 ENCOUNTER — Ambulatory Visit: Payer: Medicare Other | Admitting: Pulmonary Disease

## 2013-01-03 ENCOUNTER — Encounter: Payer: Self-pay | Admitting: Pulmonary Disease

## 2013-01-03 ENCOUNTER — Ambulatory Visit (INDEPENDENT_AMBULATORY_CARE_PROVIDER_SITE_OTHER): Payer: Medicare Other | Admitting: Pulmonary Disease

## 2013-01-03 VITALS — BP 122/84 | HR 85 | Temp 98.1°F | Ht 64.0 in | Wt 102.6 lb

## 2013-01-03 DIAGNOSIS — J479 Bronchiectasis, uncomplicated: Secondary | ICD-10-CM

## 2013-01-03 DIAGNOSIS — J449 Chronic obstructive pulmonary disease, unspecified: Secondary | ICD-10-CM

## 2013-01-03 DIAGNOSIS — A310 Pulmonary mycobacterial infection: Secondary | ICD-10-CM

## 2013-01-03 NOTE — Assessment & Plan Note (Signed)
The patient is status post recent bronchiectasis flare, but has returned to baseline with aggressive treatment with antibiotics.  She has had no further hemoptysis, and her mucus production has returned to her usual level.  I have asked her to continue on her current medications, including her pulmonary hygiene with vibratory vest.  She is to also keep her appointment with infectious disease that is upcoming.

## 2013-01-03 NOTE — Progress Notes (Signed)
  Subjective:    Patient ID: Destiny Rodriguez, female    DOB: 05/05/1925, 78 y.o.   MRN: [id number]  HPI Patient comes in today for followup of her known bronchiectasis with MAC.  She is also been followed in the infectious disease clinic, and unfortunately has not been able to stay on medications for her atypical mycobacterium.  She had a recent bronchiectasis flare with hemostasis, however responded to a prolonged course of antibiotics.  She feels that she is returned to her usual baseline, and a CT of her chest showed some actual improvement from her prior.  The patient feels that she is breathing well, and again, is back to her usual baseline.   Review of Systems  Constitutional: Negative for fever and unexpected weight change.  HENT: Negative for ear pain, nosebleeds, congestion, sore throat, rhinorrhea, sneezing, trouble swallowing, dental problem, postnasal drip and sinus pressure.   Eyes: Negative for redness and itching.  Respiratory: Positive for cough. Negative for chest tightness, shortness of breath and wheezing.   Cardiovascular: Negative for palpitations and leg swelling.  Gastrointestinal: Negative for nausea and vomiting.  Genitourinary: Negative for dysuria.  Musculoskeletal: Negative for joint swelling.  Skin: Negative for rash.  Neurological: Negative for headaches.  Hematological: Does not bruise/bleed easily.  Psychiatric/Behavioral: Negative for dysphoric mood. The patient is not nervous/anxious.        Objective:   Physical Exam Thin appearing female in no acute distress Nose without purulence or discharge noted Neck without lymphadenopathy or thyromegaly Chest with one isolated incident repot on the right anteriorly, otherwise clear Cardiac exam is regular rate and rhythm Lower extremities without edema, cyanosis Alert and oriented, moves all 4 extremities.       Assessment & Plan:

## 2013-01-03 NOTE — Patient Instructions (Addendum)
Continue on your baseline medications and vest followup with me in 6mos, but call if having increased symptoms.

## 2013-01-12 DIAGNOSIS — Z1231 Encounter for screening mammogram for malignant neoplasm of breast: Secondary | ICD-10-CM | POA: Diagnosis not present

## 2013-01-12 DIAGNOSIS — Z803 Family history of malignant neoplasm of breast: Secondary | ICD-10-CM | POA: Diagnosis not present

## 2013-01-17 DIAGNOSIS — Z09 Encounter for follow-up examination after completed treatment for conditions other than malignant neoplasm: Secondary | ICD-10-CM | POA: Diagnosis not present

## 2013-01-23 ENCOUNTER — Ambulatory Visit (INDEPENDENT_AMBULATORY_CARE_PROVIDER_SITE_OTHER): Payer: Medicare Other | Admitting: Internal Medicine

## 2013-01-23 ENCOUNTER — Encounter: Payer: Self-pay | Admitting: Internal Medicine

## 2013-01-23 VITALS — BP 127/69 | HR 96 | Temp 97.3°F | Wt 102.0 lb

## 2013-01-23 DIAGNOSIS — A318 Other mycobacterial infections: Secondary | ICD-10-CM

## 2013-01-23 DIAGNOSIS — A310 Pulmonary mycobacterial infection: Secondary | ICD-10-CM

## 2013-01-23 NOTE — Progress Notes (Signed)
[facility] NOTE  RFV: follow for MAC Subjective:    Patient ID: Destiny Rodriguez, female    DOB: 1924/06/16, 79 y.o.   MRN: [id number]  HPI Destiny Rodriguez is an 79 yo female with history of bronchiectesis, and MAC pulmonary disease dx in 2001. Last chest ct shows nodular-cavitary lesions in Dec 2012. She was diagnosed with MAC pulmonary disease in 2001, but she thought she may have had disease prior to that she is known to have frequent pulmonary infections. Due to worsening symptoms of DOE, hemoptysis, decrease exercise tolerance, she has re-initiated treatment with clarithro ,EMB, RIF on a daily basis beginning in mid/late January. Where she finished 16 months of therapy until May when she had Quick onset of fatigue, muscle aches over the last 2-3 days. bialteral ankle, knees. Difficult to hold things. Difficulty getting up from seated position. Unclear if it was related to med side effect, but symptoms resolved when she stopped taking medications. Lab work up at that time only showed elevated sed rate to 67. No elevated WBc, nor elevated CK. She did have bronchiectesis flare with mild hemoptysis in late July/early August. Repeat chest CT showed improvement from the past. Symptoms resolved after being on augmentin for 14 days.  Current Outpatient Prescriptions on File Prior to Visit  Medication Sig Dispense Refill  . calcium carbonate (OS-CAL) 600 MG TABS Take 600 mg by mouth 2 (two) times daily with a meal.        . DULERA 100-5 MCG/ACT AERO INHALE 2 PUFFS TWICE DAILY  13 g  6  . MULTIPLE VITAMIN PO Take by mouth daily.       Marland Kitchen PROAIR HFA 108 (90 BASE) MCG/ACT inhaler USE 2 PUFFS EVERY 4 - 6  HOURS AS NEEDED. SHAKEWELL.  1 Inhaler  3   No current facility-administered medications on file prior to visit.   Active Ambulatory Problems    Diagnosis Date Noted  . Pulmonary diseases due to other mycobacteria 06/07/2008  . HYPERLIPIDEMIA 03/25/2007  . SINUSITIS, CHRONIC 06/07/2008  . ALLERGIC RHINITIS  06/07/2008  . BRONCHIECTASIS 03/25/2007  . COPD, MILD 06/07/2008  . HIATAL HERNIA 03/25/2007  . Black stool 11/23/2010  . MAC (mycobacterium avium-intracellulare complex) 05/27/2011   Resolved Ambulatory Problems    Diagnosis Date Noted  . UNSPECIFIED BACTERIAL PNEUMONIA 04/01/2010  . Hemoptysis 10/15/2008  . HEMOPTYSIS UNSPECIFIED 04/01/2010  . Hemoptysis 11/23/2010   Past Medical History  Diagnosis Date  . Allergic rhinitis   . Hyperlipidemia   . Chronic sinusitis   . Bronchiectasis   . Rheumatism       Review of Systems 12 point ROS is negative. Weight stable.    Objective:   Physical Exam BP 127/69  Pulse 96  Temp(Src) 97.3 F (36.3 C) (Oral)  Wt 102 lb (46.267 kg)  BMI 17.5 kg/m2 Physical Exam  Constitutional: oriented to person, place, and time.  appears well-developed and well-nourished. No distress.  HENT:  Mouth/Throat: Oropharynx is clear and moist. No oropharyngeal exudate.  Cardiovascular: Normal rate, regular rhythm and normal heart sounds. Exam reveals no gallop and no friction rub.  No murmur heard.  Pulmonary/Chest: Effort normal and breath sounds normal. No respiratory distress.  no wheezes.  Lymphadenopathy:  no cervical adenopathy.  Neurological:  alert and oriented to person, place, and time.  Skin: Skin is warm and dry. No rash noted. No erythema.  Psychiatric:  a normal mood and affect.behavior is normal.          Assessment &  Plan:  MAC pulmonary infection = finished up to 16 month of treatment course at end of May 2014. Unable to continue much further due to side effects. Repeat Chest CT at end of July, somewhat improved likely from being on therapy. Anticipate to watch conservatively and assess to re-initiation of MAC treatment when symptoms worsen. For now, will hold off on re-initiation at this time.  bronchiectesis = resolved after 14 day course of antibiotics by Dr. Shelle Iron.  Health maintenance = will need flu vaccine today, will  recommend to get enhanced flu vax.  rtc in 6months; PRN

## 2013-02-23 ENCOUNTER — Other Ambulatory Visit: Payer: Self-pay | Admitting: Pulmonary Disease

## 2013-02-23 DIAGNOSIS — Z23 Encounter for immunization: Secondary | ICD-10-CM | POA: Diagnosis not present

## 2013-03-09 DIAGNOSIS — Z79899 Other long term (current) drug therapy: Secondary | ICD-10-CM | POA: Diagnosis not present

## 2013-03-09 DIAGNOSIS — Z0000 Encounter for general adult medical examination without abnormal findings: Secondary | ICD-10-CM | POA: Diagnosis not present

## 2013-03-09 DIAGNOSIS — H9190 Unspecified hearing loss, unspecified ear: Secondary | ICD-10-CM | POA: Diagnosis not present

## 2013-03-09 DIAGNOSIS — Z1331 Encounter for screening for depression: Secondary | ICD-10-CM | POA: Diagnosis not present

## 2013-03-16 ENCOUNTER — Other Ambulatory Visit: Payer: Self-pay | Admitting: Dermatology

## 2013-03-16 DIAGNOSIS — T148 Other injury of unspecified body region: Secondary | ICD-10-CM | POA: Diagnosis not present

## 2013-03-16 DIAGNOSIS — L821 Other seborrheic keratosis: Secondary | ICD-10-CM | POA: Diagnosis not present

## 2013-03-16 DIAGNOSIS — D485 Neoplasm of uncertain behavior of skin: Secondary | ICD-10-CM | POA: Diagnosis not present

## 2013-03-16 DIAGNOSIS — C44611 Basal cell carcinoma of skin of unspecified upper limb, including shoulder: Secondary | ICD-10-CM | POA: Diagnosis not present

## 2013-03-21 DIAGNOSIS — H903 Sensorineural hearing loss, bilateral: Secondary | ICD-10-CM | POA: Diagnosis not present

## 2013-03-29 ENCOUNTER — Other Ambulatory Visit: Payer: Self-pay

## 2013-07-06 ENCOUNTER — Ambulatory Visit: Payer: Medicare Other | Admitting: Pulmonary Disease

## 2013-07-06 DIAGNOSIS — H16229 Keratoconjunctivitis sicca, not specified as Sjogren's, unspecified eye: Secondary | ICD-10-CM | POA: Diagnosis not present

## 2013-07-18 ENCOUNTER — Ambulatory Visit (INDEPENDENT_AMBULATORY_CARE_PROVIDER_SITE_OTHER): Payer: Medicare Other | Admitting: Pulmonary Disease

## 2013-07-18 ENCOUNTER — Encounter: Payer: Self-pay | Admitting: Pulmonary Disease

## 2013-07-18 VITALS — BP 126/72 | HR 82 | Temp 97.5°F | Ht 64.5 in | Wt 107.0 lb

## 2013-07-18 DIAGNOSIS — J479 Bronchiectasis, uncomplicated: Secondary | ICD-10-CM | POA: Diagnosis not present

## 2013-07-18 DIAGNOSIS — A310 Pulmonary mycobacterial infection: Secondary | ICD-10-CM | POA: Diagnosis not present

## 2013-07-18 DIAGNOSIS — J449 Chronic obstructive pulmonary disease, unspecified: Secondary | ICD-10-CM | POA: Diagnosis not present

## 2013-07-18 NOTE — Progress Notes (Signed)
   Subjective:    Patient ID: Destiny Rodriguez, female    DOB: 01-29-1925, 80 y.o.   MRN: [id number]  HPI The patient comes in today for followup of her known bronchiectasis with mild airflow obstruction, as well as a history of MAC. Overall, she has done very well since the last visit, and his only had one episode of hemoptysis that was self limiting and did not recur. She has had no significant increasing chest congestion, and no increase in mucus quantity or character. She is staying on her bronchodilator regimen, and is using her vibratory best compliantly. She has been eating well, and has not lost significant weight.   Review of Systems  Constitutional: Negative for fever and unexpected weight change.  HENT: Negative for congestion, dental problem, ear pain, nosebleeds, postnasal drip, rhinorrhea, sinus pressure, sneezing, sore throat and trouble swallowing.   Eyes: Negative for redness and itching.  Respiratory: Positive for cough, shortness of breath and wheezing. Negative for chest tightness.        Hemoptysis x61mo ago--little clot  Cardiovascular: Negative for palpitations and leg swelling.  Gastrointestinal: Negative for nausea and vomiting.  Genitourinary: Negative for dysuria.  Musculoskeletal: Negative for joint swelling.  Skin: Negative for rash.  Neurological: Negative for headaches.  Hematological: Does not bruise/bleed easily.  Psychiatric/Behavioral: Negative for dysphoric mood. The patient is not nervous/anxious.        Objective:   Physical Exam Thin female in no acute distress Nose without purulence or discharge noted Neck without lymphadenopathy or thyromegaly Chest with a few scattered crackles, but no wheezing and excellent air flow. Cardiac exam with regular rate and rhythm Lower extremities without edema, no cyanosis Alert and oriented, moves all 4 extremities.       Assessment & Plan:

## 2013-07-18 NOTE — Assessment & Plan Note (Signed)
The patient seems to be doing well from a bronchiectasis standpoint, with no recent acute exacerbation. I've asked her to continue on her pulmonary hygiene, including her vibratory vest.  She is to call if she has changes in her mucus quantity or characteristics.

## 2013-07-18 NOTE — Patient Instructions (Signed)
No change in medications, and continue with vibratory vest.  followup with me again in 40mos, but call if you have a change in your symptoms.

## 2013-07-18 NOTE — Assessment & Plan Note (Signed)
The patient feels that her breathing has been adequate, and has not had any flareups. She is to continue on her current bronchodilator regimen.

## 2013-07-24 ENCOUNTER — Encounter: Payer: Self-pay | Admitting: Internal Medicine

## 2013-07-24 ENCOUNTER — Ambulatory Visit (INDEPENDENT_AMBULATORY_CARE_PROVIDER_SITE_OTHER): Payer: Medicare Other | Admitting: Internal Medicine

## 2013-07-24 VITALS — BP 129/80 | HR 83 | Temp 97.9°F | Wt 106.0 lb

## 2013-07-24 DIAGNOSIS — A318 Other mycobacterial infections: Secondary | ICD-10-CM

## 2013-07-24 DIAGNOSIS — A310 Pulmonary mycobacterial infection: Secondary | ICD-10-CM

## 2013-07-24 NOTE — Progress Notes (Signed)
  Cc: follow up, "gradulally feeling better" from MAC Subjective:    Patient ID: Destiny Rodriguez, female    DOB: 08-Jul-1924, 80 y.o.   MRN: [id number]  HPI Bourget is an 80 yo female with history of bronchiectesis, and MAC pulmonary disease dx in 2001. Last chest ct shows nodular-cavitary lesions in Dec 2012. She was diagnosed with MAC pulmonary disease in 2001, but she thought she may have had disease prior to that she is known to have frequent pulmonary infections. Due to worsening symptoms of DOE, hemoptysis, decrease exercise tolerance, she has re-initiated treatment with clarithro ,EMB, RIF on a daily basis beginning in mid/late January. Where she finished 16 months of therapy until May 2014. She ended 2 months shy of 18 months of therapy due to quick onset of fatigue, myalgia. Unclear if it was related to med side effect, but symptoms resolved when she stopped taking medications. Lab work up at that time only showed elevated sed rate to 67. No elevated WBc, nor elevated CK.   She did have bronchiectesis flare with mild hemoptysis in late July/early August. Repeat chest CT showed improvement from the past. Last seen in Sep 2014. Doing well overall. Did not have a couple spells of bronchiectesus occ hemoptysis 5 months ago,tread with amox/clav x 14 days. then alittle last month but no regular pattern. Has not had any recent antibiotics since then.   Allergies  Allergen Reactions  . Tequin    Current Outpatient Prescriptions on File Prior to Visit  Medication Sig Dispense Refill  . calcium carbonate (OS-CAL) 600 MG TABS Take 600 mg by mouth 2 (two) times daily with a meal.        . DULERA 100-5 MCG/ACT AERO INHALE 2 PUFFS TWICE DAILY  13 g  6  . MULTIPLE VITAMIN PO Take by mouth daily.       Marland Kitchen PROAIR HFA 108 (90 BASE) MCG/ACT inhaler USE 2 PUFFS EVERY 4 - 6  HOURS AS NEEDED. SHAKEWELL.  1 Inhaler  3   No current facility-administered medications on file prior to visit.   Active Ambulatory  Problems    Diagnosis Date Noted  . Pulmonary diseases due to other mycobacteria 06/07/2008  . HYPERLIPIDEMIA 03/25/2007  . SINUSITIS, CHRONIC 06/07/2008  . ALLERGIC RHINITIS 06/07/2008  . BRONCHIECTASIS 03/25/2007  . COPD, MILD 06/07/2008  . HIATAL HERNIA 03/25/2007  . Black stool 11/23/2010  . MAC (mycobacterium avium-intracellulare complex) 05/27/2011   Resolved Ambulatory Problems    Diagnosis Date Noted  . UNSPECIFIED BACTERIAL PNEUMONIA 04/01/2010  . Hemoptysis 10/15/2008  . HEMOPTYSIS UNSPECIFIED 04/01/2010  . Hemoptysis 11/23/2010   Past Medical History  Diagnosis Date  . Allergic rhinitis   . Hyperlipidemia   . Chronic sinusitis   . Bronchiectasis   . Rheumatism     Review of Systems 10 point ros is negative except occ productive cough. And having occ. Severe cramping of legs, not having it every day.now just once a month.    Objective:   Physical Exam BP 129/80  Pulse 83  Temp(Src) 97.9 F (36.6 C) (Oral)  Wt 106 lb (48.081 kg) gen = a xo by 3 in nad, thin female Heent= NCAT, PERRLA, EOMI, no scleral icterus, OP clear PULM= CTAB no w/c/r Cors = nl s1,s2 no g/m/r       Assessment & Plan:  Pulmonary mac = doing well thus far, no symptoms thus far. No need for treatment at this time.  rtc in 6 months

## 2013-07-26 ENCOUNTER — Telehealth: Payer: Self-pay | Admitting: Pulmonary Disease

## 2013-07-26 NOTE — Telephone Encounter (Signed)
Spoke with the pt She states that [facility] was on backorder at [facility], and needs alternative  I called and confirmed this with [facility] Pharmacist advised that they do not have the 100 mcg strength, but they do have the 200 mcg strength  They are asking if she can take the higher dose at 1 puff daily Please advise, thanks!

## 2013-07-27 MED ORDER — MOMETASONE FURO-FORMOTEROL FUM 100-5 MCG/ACT IN AERO: 2.0000 | INHALATION_SPRAY | Freq: Two times a day (BID) | RESPIRATORY_TRACT | Status: DC

## 2013-07-27 NOTE — Telephone Encounter (Signed)
PT CALLING AGAIN ABOUT CLEARANCE OR AUTHORIZATION ON MEDICATION PLEASE CALL.Hillery Hunter

## 2013-07-27 NOTE — Telephone Encounter (Signed)
I have spoken to the pt. Advised her of KC's recs. I have contacted [facility] in [facility]. They have Dulera 158mcg in stock. Pt would like her prescription to be sent there. Rx has been sent. Nothing further is needed.

## 2013-07-27 NOTE — Telephone Encounter (Signed)
No she cannot.  This is not the same Would recommend that she get dulera from another pharmacy.  Please assist her with getting this done.

## 2013-07-27 NOTE — Telephone Encounter (Signed)
Pharm calling to change dosage on meds  [phone] ext [phone].Hillery Hunter

## 2013-08-01 ENCOUNTER — Telehealth: Payer: Self-pay

## 2013-08-01 NOTE — Telephone Encounter (Signed)
That is not an adequate substitution.  She has done very well on dulera.  Would suggest going to a different pharmacy that does carry it.  This is not a national problem.

## 2013-08-01 NOTE — Telephone Encounter (Signed)
Med has already been sent in to [facility], pt has picked up.  Nothing further needed.

## 2013-08-01 NOTE — Telephone Encounter (Signed)
Dulera 135mcg is on backorder until April.  Pt is requesting an alternative.  Pharmacy has suggested Dulera 200 1 puff bid.  Please advise.  Thank you.

## 2013-08-08 ENCOUNTER — Other Ambulatory Visit: Payer: Self-pay | Admitting: Pulmonary Disease

## 2013-09-05 ENCOUNTER — Telehealth: Payer: Self-pay | Admitting: Pulmonary Disease

## 2013-09-05 MED ORDER — FIRST-DUKES MOUTHWASH MT SUSP: 5.0000 mL | Freq: Three times a day (TID) | OROMUCOSAL | Status: DC | PRN

## 2013-09-05 NOTE — Telephone Encounter (Signed)
Ok to call in magic mouthwash, 5cc tid prn for 5days only QS, no fills.

## 2013-09-05 NOTE — Telephone Encounter (Signed)
Pt aware of recs. rx sent in. Nothing further needed 

## 2013-09-05 NOTE — Telephone Encounter (Signed)
Spoke with pt. She c/o mouth feeling sore. She does not have anything white in her mouth or see any sores. She reports she rinses her mouth out after using her dulera. She has not had this problem before. She is taking chlortrimeton daily as well but not sure where her mouth soreness is coming from. She wants something called in to help with this. Please advise KC thanks  Allergies  Allergen Reactions  . Tequin

## 2013-09-18 ENCOUNTER — Ambulatory Visit (INDEPENDENT_AMBULATORY_CARE_PROVIDER_SITE_OTHER): Payer: Medicare Other | Admitting: Adult Health

## 2013-09-18 ENCOUNTER — Encounter: Payer: Self-pay | Admitting: Adult Health

## 2013-09-18 ENCOUNTER — Ambulatory Visit (INDEPENDENT_AMBULATORY_CARE_PROVIDER_SITE_OTHER)
Admission: RE | Admit: 2013-09-18 | Discharge: 2013-09-18 | Disposition: A | Discharge status: Home / Self Care | Payer: Medicare Other | Source: Ambulatory Visit | Attending: Adult Health | Admitting: Adult Health

## 2013-09-18 VITALS — BP 122/70 | HR 89 | Temp 97.9°F | Ht 64.0 in | Wt 106.8 lb

## 2013-09-18 DIAGNOSIS — R042 Hemoptysis: Secondary | ICD-10-CM | POA: Diagnosis not present

## 2013-09-18 DIAGNOSIS — J479 Bronchiectasis, uncomplicated: Secondary | ICD-10-CM | POA: Diagnosis not present

## 2013-09-18 MED ORDER — AMOXICILLIN-POT CLAVULANATE 875-125 MG PO TABS: 1.0000 | ORAL_TABLET | Freq: Two times a day (BID) | ORAL | Status: AC

## 2013-09-18 NOTE — Assessment & Plan Note (Signed)
Exacerbation  Check cxr   Plan  Augmentin 875mg  Twice daily  For 10 days , take w/ food, eat yogurt .  Mucinex Twice daily  As needed  Cough/congestion  Delsym 2 tsp Twice daily  As needed  For severe coughing spasms.  Continue on Vest therapy.  Follow up Dr. Gwenette Greet 3-4 weeks and As needed   Please contact office for sooner follow up if symptoms do not improve or worsen or seek emergency care

## 2013-09-18 NOTE — Progress Notes (Signed)
  Subjective:    Patient ID: Destiny Rodriguez, female    DOB: November 01, 1924, 80 y.o.   MRN: [id number]  HPI  80  yo female with history of known hx of bronchiectesis, and MAC pulmonary disease dx in 2001 CT chest 04/2011: large cavitary disease superimposed over bronchiectasis.  Started on MAC meds per ID 05/2011-11/2012  CT chest 12/20/12 >Bronchiectasis, pulmonary parenchymal cysts,   A collapsed cyst in the left upper lobe with associated presumed pulmonary parenchymal scarring    .09/18/2013 Acute OV  Complains of cold moving into chest x 1wk.,coughing up yellow this mucus . Noticed some blood tinged mucus w/  dark mucus . Has severe coughing spasms. Continues w/ vest . Did cough up red clot coughed up last pm. Has hoarseness,sob worse,chest soreness from coughing,no wheezing,denies fcs. Was at beach last week. No recent abx.  No chest pain, orthopnea, n/v/d, edema or fever.      Review of Systems  Constitutional:   No  weight loss, night sweats,  Fevers, chills, fatigue, or  lassitude.  HEENT:   No headaches,  Difficulty swallowing,  Tooth/dental problems, or  Sore throat,                No sneezing, itching, ear ache, nasal congestion, post nasal drip,   CV:  No chest pain,  Orthopnea, PND, swelling in lower extremities, anasarca, dizziness, palpitations, syncope.   GI  No heartburn, indigestion, abdominal pain, nausea, vomiting, diarrhea, change in bowel habits, loss of appetite, bloody stools.   Resp:    No chest wall deformity  Skin: no rash or lesions.  GU: no dysuria, change in color of urine, no urgency or frequency.  No flank pain, no hematuria   MS:  .  No back pain.  Psych:  No change in mood or affect. No depression or anxiety.  No memory loss.         Objective:   Physical Exam  GEN: A/Ox3; pleasant , NAD, thin female   HEENT:  [place]/AT,  EACs-clear, TMs-wnl, NOSE-clear, THROAT-clear, no lesions, no postnasal drip or exudate noted.   NECK:  Supple w/ fair  ROM; no JVD; normal carotid impulses w/o bruits; no thyromegaly or nodules palpated; no lymphadenopathy.  RESP  Coarse rhonchi , no accessory muscle use, no dullness to percussion  CARD:  RRR, no m/r/g  , no peripheral edema, pulses intact, no cyanosis or clubbing.  GI:   Soft & nt; nml bowel sounds; no organomegaly or masses detected.  Musco: Warm bil, no deformities or joint swelling noted.   Neuro: alert, no focal deficits noted.    Skin: Warm, no lesions or rashes         Assessment & Plan:

## 2013-09-18 NOTE — Progress Notes (Signed)
Quick Note:  Called spoke with patient, advised of cxr results / recs as stated by TP. Pt verbalized her understanding and denied any questions. ______ 

## 2013-09-18 NOTE — Patient Instructions (Signed)
Augmentin 875mg  Twice daily  For 10 days , take w/ food, eat yogurt .  Mucinex Twice daily  As needed  Cough/congestion  Delsym 2 tsp Twice daily  As needed  For severe coughing spasms.  Continue on Vest therapy.  Follow up Dr. Gwenette Greet 3-4 weeks and As needed   Please contact office for sooner follow up if symptoms do not improve or worsen or seek emergency care

## 2013-09-18 NOTE — Progress Notes (Signed)
Ov reviewed, and agree with plan as outlined.  

## 2013-10-10 ENCOUNTER — Encounter: Payer: Self-pay | Admitting: Pulmonary Disease

## 2013-10-10 ENCOUNTER — Ambulatory Visit (INDEPENDENT_AMBULATORY_CARE_PROVIDER_SITE_OTHER): Payer: Medicare Other | Admitting: Pulmonary Disease

## 2013-10-10 VITALS — BP 102/68 | HR 82 | Temp 96.8°F | Ht 64.5 in | Wt 104.6 lb

## 2013-10-10 DIAGNOSIS — A310 Pulmonary mycobacterial infection: Secondary | ICD-10-CM

## 2013-10-10 DIAGNOSIS — J449 Chronic obstructive pulmonary disease, unspecified: Secondary | ICD-10-CM | POA: Diagnosis not present

## 2013-10-10 DIAGNOSIS — J479 Bronchiectasis, uncomplicated: Secondary | ICD-10-CM | POA: Diagnosis not present

## 2013-10-10 NOTE — Assessment & Plan Note (Signed)
The patient is much improved since being treated with antibiotics, but has not quite returned to baseline. I have asked her to continue on her current meds, but to let me know if she does not get back to her baseline.

## 2013-10-10 NOTE — Progress Notes (Signed)
   Subjective:    Patient ID: Destiny Rodriguez, female    DOB: 1925/03/16, 80 y.o.   MRN: [id number]  HPI The patient comes in today for followup of her known bronchiectasis with MAC colonization. She recently had an acute exacerbation of her bronchiectasis, and was seen by our nurse practitioner and treated with antibiotics. She is significantly improved, but has not quite back to her usual baseline. She has minimal hemoptysis and her mucus is clearing from her acute visit. She denies any increased shortness of breath.   Review of Systems  Constitutional: Negative for fever and unexpected weight change.  HENT: Negative for congestion, dental problem, ear pain, nosebleeds, postnasal drip, rhinorrhea, sinus pressure, sneezing, sore throat and trouble swallowing.   Eyes: Negative for redness and itching.  Respiratory: Positive for cough. Negative for chest tightness, shortness of breath and wheezing.   Cardiovascular: Negative for palpitations and leg swelling.  Gastrointestinal: Negative for nausea and vomiting.  Genitourinary: Negative for dysuria.  Musculoskeletal: Negative for joint swelling.  Skin: Negative for rash.  Neurological: Negative for headaches.  Hematological: Does not bruise/bleed easily.  Psychiatric/Behavioral: Negative for dysphoric mood. The patient is not nervous/anxious.        Objective:   Physical Exam Thin female in no acute distress Nose without purulence or discharge noted Neck without lymphadenopathy or thyromegaly Chest with a few inspiratory pops and crackles, no wheezing and adequate airflow Cardiac exam with regular rate and rhythm Lower extremities without edema, no cyanosis Alert and oriented, moves all 4 extremities       Assessment & Plan:

## 2013-10-10 NOTE — Patient Instructions (Signed)
No change in current medications Let me know if your cough and mucus do not return to your usual baseline.  followup with me again at your already scheduled apptm.

## 2013-10-18 ENCOUNTER — Other Ambulatory Visit: Payer: Self-pay | Admitting: Pulmonary Disease

## 2013-11-23 ENCOUNTER — Telehealth: Payer: Self-pay | Admitting: Internal Medicine

## 2013-11-23 NOTE — Telephone Encounter (Signed)
On call- OOT at wedding. Coughed blood and clot last night, just clot today. Plan- Called augmentin 875 x 10 days to [facility] in [place]

## 2013-11-28 ENCOUNTER — Ambulatory Visit (INDEPENDENT_AMBULATORY_CARE_PROVIDER_SITE_OTHER): Payer: Medicare Other | Admitting: Adult Health

## 2013-11-28 ENCOUNTER — Telehealth: Payer: Self-pay | Admitting: Pulmonary Disease

## 2013-11-28 ENCOUNTER — Encounter: Payer: Self-pay | Admitting: Adult Health

## 2013-11-28 VITALS — BP 102/64 | HR 102 | Temp 98.6°F | Ht 64.0 in | Wt 102.6 lb

## 2013-11-28 DIAGNOSIS — J471 Bronchiectasis with (acute) exacerbation: Secondary | ICD-10-CM | POA: Diagnosis not present

## 2013-11-28 NOTE — Telephone Encounter (Signed)
Called spoke with pt. appt scheduled for pt to see PT this Am at 10:15. Nothing further needed

## 2013-11-28 NOTE — Progress Notes (Signed)
  Subjective:    Patient ID: Destiny Rodriguez, female    DOB: [birth year], 80 y.o.   MRN: [id number]  HPI  80  yo female with history of known hx of bronchiectesis, and MAC pulmonary disease dx in 2001 CT chest 04/2011: large cavitary disease superimposed over bronchiectasis.  Started on MAC meds per ID 05/2011-11/2012  CT chest 12/20/12 >Bronchiectasis, pulmonary parenchymal cysts,   A collapsed cyst in the left upper lobe with associated presumed pulmonary parenchymal scarring    .11/28/2013 Acute OV  Complains of Hemoptysis x2 episodes within the past 6 days.  Reports was BRB at approx 2Tbsp each time mixed with mucus. First happended 5 days ago. Then second episode was last night. Happens after dry coughing fit.  Was called in l taking Augmentin 875mg  x10days (on day 5/1) .   Has severe coughing spasm at times. . Continues w/ vest . No chest pain, orthopnea, n/v/d, edema or fever. Does not feel that bad.  Last cxr 08/2013 w/ chronic changes.  Last abx was 08/2013 .       Review of Systems  Constitutional:   No  weight loss, night sweats,  Fevers, chills, +fatigue, or  lassitude.  HEENT:   No headaches,  Difficulty swallowing,  Tooth/dental problems, or  Sore throat,                No sneezing, itching, ear ache, nasal congestion, post nasal drip,   CV:  No chest pain,  Orthopnea, PND, swelling in lower extremities, anasarca, dizziness, palpitations, syncope.   GI  No heartburn, indigestion, abdominal pain, nausea, vomiting, diarrhea, change in bowel habits, loss of appetite, bloody stools.   Resp:    No chest wall deformity  Skin: no rash or lesions.  GU: no dysuria, change in color of urine, no urgency or frequency.  No flank pain, no hematuria   MS:  .  No back pain.  Psych:  No change in mood or affect. No depression or anxiety.  No memory loss.         Objective:   Physical Exam  GEN: A/Ox3; pleasant , NAD, thin female   HEENT:  [place]/AT,  EACs-clear, TMs-wnl,  NOSE-clear, THROAT-clear, no lesions, no postnasal drip or exudate noted.   NECK:  Supple w/ fair ROM; no JVD; normal carotid impulses w/o bruits; no thyromegaly or nodules palpated; no lymphadenopathy.  RESP  Min rhonchi , no accessory muscle use, no dullness to percussion  CARD:  RRR, no m/r/g  , no peripheral edema, pulses intact, no cyanosis or clubbing.  GI:   Soft & nt; nml bowel sounds; no organomegaly or masses detected.  Musco: Warm bil, no deformities or joint swelling noted.   Neuro: alert, no focal deficits noted.    Skin: Warm, no lesions or rashes         Assessment & Plan:

## 2013-11-28 NOTE — Patient Instructions (Signed)
Finish Augmentin 875mg  , take w/ food, eat yogurt . Mucinex Twice daily  As needed  Cough/congestion  Delsym 2 tsp Twice daily  As needed  For severe coughing spasms.  Continue on Vest therapy.  Follow up Dr. Gwenette Greet 6  weeks and As needed   Please contact office for sooner follow up if symptoms do not improve or worsen or seek emergency care

## 2013-11-28 NOTE — Assessment & Plan Note (Signed)
?  Resolving flare w/ recurrent hemoptysis  Advised if hemoptysis persists will need to return sooner.   Plan  Finish Augmentin 875mg  , take w/ food, eat yogurt . Mucinex Twice daily  As needed  Cough/congestion  Delsym 2 tsp Twice daily  As needed  For severe coughing spasms.  Continue on Vest therapy.  Follow up Dr. Gwenette Greet 6  weeks and As needed   Please contact office for sooner follow up if symptoms do not improve or worsen or seek emergency care

## 2013-11-28 NOTE — Progress Notes (Signed)
Ov reviewed, and agree with plan as outlined.  

## 2013-12-05 ENCOUNTER — Ambulatory Visit (INDEPENDENT_AMBULATORY_CARE_PROVIDER_SITE_OTHER): Payer: Medicare Other | Admitting: Adult Health

## 2013-12-05 ENCOUNTER — Other Ambulatory Visit: Payer: Medicare Other

## 2013-12-05 ENCOUNTER — Ambulatory Visit (INDEPENDENT_AMBULATORY_CARE_PROVIDER_SITE_OTHER)
Admission: RE | Admit: 2013-12-05 | Discharge: 2013-12-05 | Disposition: A | Discharge status: Home / Self Care | Payer: Medicare Other | Source: Ambulatory Visit | Attending: Adult Health | Admitting: Adult Health

## 2013-12-05 ENCOUNTER — Encounter: Payer: Self-pay | Admitting: Adult Health

## 2013-12-05 ENCOUNTER — Other Ambulatory Visit: Payer: Self-pay | Admitting: Adult Health

## 2013-12-05 VITALS — BP 118/60 | HR 91 | Temp 98.2°F | Ht 64.5 in | Wt 103.8 lb

## 2013-12-05 DIAGNOSIS — J471 Bronchiectasis with (acute) exacerbation: Secondary | ICD-10-CM | POA: Diagnosis not present

## 2013-12-05 DIAGNOSIS — J984 Other disorders of lung: Secondary | ICD-10-CM | POA: Diagnosis not present

## 2013-12-05 DIAGNOSIS — J189 Pneumonia, unspecified organism: Secondary | ICD-10-CM | POA: Diagnosis not present

## 2013-12-05 DIAGNOSIS — J181 Lobar pneumonia, unspecified organism: Secondary | ICD-10-CM

## 2013-12-05 MED ORDER — LEVALBUTEROL HCL 0.63 MG/3ML IN NEBU
0.6300 mg | INHALATION_SOLUTION | Freq: Once | RESPIRATORY_TRACT | Status: AC
Start: 2013-12-05 — End: 2013-12-05
  Administered 2013-12-05: 0.63 mg via RESPIRATORY_TRACT

## 2013-12-05 MED ORDER — LEVOFLOXACIN 750 MG PO TABS: 750.0000 mg | ORAL_TABLET | Freq: Every day | ORAL | Status: AC

## 2013-12-05 MED ORDER — METHYLPREDNISOLONE ACETATE 80 MG/ML IJ SUSP
80.0000 mg | Freq: Once | INTRAMUSCULAR | Status: AC
Start: 2013-12-05 — End: 2013-12-05
  Administered 2013-12-05: 80 mg via INTRAMUSCULAR

## 2013-12-05 NOTE — Progress Notes (Signed)
Subjective:    Patient ID: Destiny Rodriguez, female    DOB: 11-May-1925, 80 y.o.   MRN: [id number]  HPI 80  yo female with history of known hx of bronchiectesis, and MAC pulmonary disease dx in 2001 CT chest 04/2011: large cavitary disease superimposed over bronchiectasis.  Started on MAC meds per ID 05/2011-11/2012  CT chest 12/20/12 >Bronchiectasis, pulmonary parenchymal cysts,   A collapsed cyst in the left upper lobe with associated presumed pulmonary parenchymal scarring    11/28/13  Acute OV  Complains of Hemoptysis x2 episodes within the past 6 days.  Reports was BRB at approx 2Tbsp each time mixed with mucus. First happended 5 days ago. Then second episode was last night. Happens after dry coughing fit.  Was called in l taking Augmentin 875mg  x10days (on day 5/10) .   Has severe coughing spasm at times. . Continues w/ vest . No chest pain, orthopnea, n/v/d, edema or fever. Does not feel that bad.  Last cxr 08/2013 w/ chronic changes.  Last abx was 08/2013 .  >tx w/ augmentin   12/05/2013 Acute OV  Pt returns for persistent symptoms with cough, congestion.  Seen last week with bronchiectasis flare. Tx w/ Augmentin x 10 d . Continues to have blood tinged mucus.  CXR today shows increased RUL opacity.  No fever.  Taking mucinex . Uses Vest daily .  Says she feels okay but concerned that she coughed up more mucus w/ blood mixed in.  CXR shows opacity in RUL c/w PNA  Denies fever, n/v/d, edema or orthopnea.  Appetite is good.    Review of Systems  Constitutional:   No  weight loss, night sweats,  Fevers, chills, +fatigue, or  lassitude.  HEENT:   No headaches,  Difficulty swallowing,  Tooth/dental problems, or  Sore throat,                No sneezing, itching, ear ache, nasal congestion, post nasal drip,   CV:  No chest pain,  Orthopnea, PND, swelling in lower extremities, anasarca, dizziness, palpitations, syncope.   GI  No heartburn, indigestion, abdominal pain, nausea,  vomiting, diarrhea, change in bowel habits, loss of appetite, bloody stools.   Resp:    No chest wall deformity  Skin: no rash or lesions.  GU: no dysuria, change in color of urine, no urgency or frequency.  No flank pain, no hematuria   MS:  .  No back pain.  Psych:  No change in mood or affect. No depression or anxiety.  No memory loss.         Objective:   Physical Exam  GEN: A/Ox3; pleasant , NAD, thin female   HEENT:  [place]/AT,  EACs-clear, TMs-wnl, NOSE-clear, THROAT-clear, no lesions, no postnasal drip or exudate noted.   NECK:  Supple w/ fair ROM; no JVD; normal carotid impulses w/o bruits; no thyromegaly or nodules palpated; no lymphadenopathy.  RESP  Min rhonchi , no accessory muscle use, no dullness to percussion  CARD:  RRR, no m/r/g  , no peripheral edema, pulses intact, no cyanosis or clubbing.  GI:   Soft & nt; nml bowel sounds; no organomegaly or masses detected.  Musco: Warm bil, no deformities or joint swelling noted.   Neuro: alert, no focal deficits noted.    Skin: Warm, no lesions or rashes   CXR 12/06/2013 Moderate to severe chronic lung disease. Mildly increased opacity in the periphery of the right upper lobe since comparison suggesting acute infectious exacerbation.  Assessment & Plan:

## 2013-12-05 NOTE — Patient Instructions (Signed)
Levaquin 750mg  daily for 7 days -take with food , eat yogurt.  Mucinex Twice daily  As needed  Cough/congestion  Delsym 2 tsp Twice daily  As needed  For severe coughing spasms.  Continue on Vest therapy.  Sputum culture sent.  Follow up Dr. Gwenette Greet in 2-3 weeks  And As needed   Please contact office for sooner follow up if symptoms do not improve or worsen or seek emergency care

## 2013-12-06 DIAGNOSIS — J189 Pneumonia, unspecified organism: Secondary | ICD-10-CM | POA: Insufficient documentation

## 2013-12-06 NOTE — Assessment & Plan Note (Signed)
Slow to resolve Bronchiectasis flare with RUL PNA  Check sputum cx  Hx of MAI - check sputum afb   Plan  Levaquin 750mg  daily for 7 days -take with food , eat yogurt.  Mucinex Twice daily  As needed  Cough/congestion  Delsym 2 tsp Twice daily  As needed  For severe coughing spasms.  Continue on Vest therapy.  Sputum culture sent.  Follow up Dr. Gwenette Greet in 2-3 weeks  And As needed   Please contact office for sooner follow up if symptoms do not improve or worsen or seek emergency care

## 2013-12-07 ENCOUNTER — Encounter (HOSPITAL_COMMUNITY): Payer: Self-pay | Admitting: Emergency Medicine

## 2013-12-07 ENCOUNTER — Inpatient Hospital Stay (HOSPITAL_COMMUNITY)
Admission: EM | Admit: 2013-12-07 | Discharge: 2013-12-10 | DRG: 191 | Disposition: A | Discharge status: Home / Self Care | Payer: Medicare Other | Attending: Emergency Medicine | Admitting: Emergency Medicine

## 2013-12-07 ENCOUNTER — Emergency Department (HOSPITAL_COMMUNITY): Payer: Medicare Other

## 2013-12-07 DIAGNOSIS — Z8249 Family history of ischemic heart disease and other diseases of the circulatory system: Secondary | ICD-10-CM

## 2013-12-07 DIAGNOSIS — J329 Chronic sinusitis, unspecified: Secondary | ICD-10-CM

## 2013-12-07 DIAGNOSIS — R042 Hemoptysis: Secondary | ICD-10-CM | POA: Diagnosis present

## 2013-12-07 DIAGNOSIS — Z825 Family history of asthma and other chronic lower respiratory diseases: Secondary | ICD-10-CM | POA: Diagnosis not present

## 2013-12-07 DIAGNOSIS — J8410 Pulmonary fibrosis, unspecified: Secondary | ICD-10-CM | POA: Diagnosis not present

## 2013-12-07 DIAGNOSIS — J189 Pneumonia, unspecified organism: Secondary | ICD-10-CM | POA: Diagnosis not present

## 2013-12-07 DIAGNOSIS — J471 Bronchiectasis with (acute) exacerbation: Principal | ICD-10-CM

## 2013-12-07 DIAGNOSIS — A310 Pulmonary mycobacterial infection: Secondary | ICD-10-CM

## 2013-12-07 DIAGNOSIS — J449 Chronic obstructive pulmonary disease, unspecified: Secondary | ICD-10-CM

## 2013-12-07 DIAGNOSIS — Z803 Family history of malignant neoplasm of breast: Secondary | ICD-10-CM

## 2013-12-07 DIAGNOSIS — E785 Hyperlipidemia, unspecified: Secondary | ICD-10-CM | POA: Diagnosis present

## 2013-12-07 DIAGNOSIS — K449 Diaphragmatic hernia without obstruction or gangrene: Secondary | ICD-10-CM

## 2013-12-07 DIAGNOSIS — Z79899 Other long term (current) drug therapy: Secondary | ICD-10-CM

## 2013-12-07 DIAGNOSIS — A318 Other mycobacterial infections: Secondary | ICD-10-CM | POA: Diagnosis present

## 2013-12-07 DIAGNOSIS — R6889 Other general symptoms and signs: Secondary | ICD-10-CM | POA: Diagnosis not present

## 2013-12-07 DIAGNOSIS — K921 Melena: Secondary | ICD-10-CM

## 2013-12-07 DIAGNOSIS — Z681 Body mass index (BMI) 19 or less, adult: Secondary | ICD-10-CM

## 2013-12-07 DIAGNOSIS — J470 Bronchiectasis with acute lower respiratory infection: Secondary | ICD-10-CM

## 2013-12-07 DIAGNOSIS — J309 Allergic rhinitis, unspecified: Secondary | ICD-10-CM

## 2013-12-07 LAB — CBC WITH DIFFERENTIAL/PLATELET
Basophils Absolute: 0 10*3/uL (ref 0.0–0.1)
Basophils Relative: 0 % (ref 0–1)
Eosinophils Absolute: 0.1 10*3/uL (ref 0.0–0.7)
Eosinophils Relative: 1 % (ref 0–5)
HCT: 40.8 % (ref 36.0–46.0)
HEMOGLOBIN: 13.4 g/dL (ref 12.0–15.0)
LYMPHS ABS: 1.7 10*3/uL (ref 0.7–4.0)
LYMPHS PCT: 14 % (ref 12–46)
MCH: 30.7 pg (ref 26.0–34.0)
MCHC: 32.8 g/dL (ref 30.0–36.0)
MCV: 93.4 fL (ref 78.0–100.0)
MONOS PCT: 11 % (ref 3–12)
Monocytes Absolute: 1.4 10*3/uL — ABNORMAL HIGH (ref 0.1–1.0)
NEUTROS ABS: 9.3 10*3/uL — AB (ref 1.7–7.7)
NEUTROS PCT: 74 % (ref 43–77)
PLATELETS: 272 10*3/uL (ref 150–400)
RBC: 4.37 MIL/uL (ref 3.87–5.11)
RDW: 13.2 % (ref 11.5–15.5)
WBC: 12.5 10*3/uL — AB (ref 4.0–10.5)

## 2013-12-07 LAB — EXPECTORATED SPUTUM ASSESSMENT W REFEX TO RESP CULTURE

## 2013-12-07 LAB — BASIC METABOLIC PANEL
Anion gap: 10 (ref 5–15)
BUN: 16 mg/dL (ref 6–23)
CHLORIDE: 99 meq/L (ref 96–112)
CO2: 27 mEq/L (ref 19–32)
Calcium: 10 mg/dL (ref 8.4–10.5)
Creatinine, Ser: 0.63 mg/dL (ref 0.50–1.10)
GFR calc Af Amer: >90 mL/min (ref >90)
GFR, EST NON AFRICAN AMERICAN: 78 mL/min — AB (ref >90)
GLUCOSE: 120 mg/dL — AB (ref 70–99)
Potassium: 4.1 mEq/L (ref 3.7–5.3)
Sodium: 136 mEq/L — ABNORMAL LOW (ref 137–147)

## 2013-12-07 LAB — EXPECTORATED SPUTUM ASSESSMENT W GRAM STAIN, RFLX TO RESP C

## 2013-12-07 LAB — I-STAT CG4 LACTIC ACID, ED: Lactic Acid, Venous: 0.91 mmol/L (ref 0.5–2.2)

## 2013-12-07 MED ORDER — CALCIUM CARBONATE 1250 (500 CA) MG PO TABS
1.0000 | ORAL_TABLET | Freq: Two times a day (BID) | ORAL | Status: DC
  Administered 2013-12-07 – 2013-12-10 (×6): 500 mg via ORAL
  Filled 2013-12-07 (×8): qty 1

## 2013-12-07 MED ORDER — ADULT MULTIVITAMIN W/MINERALS CH
1.0000 | ORAL_TABLET | Freq: Every day | ORAL | Status: DC
  Administered 2013-12-07 – 2013-12-10 (×4): 1 via ORAL
  Filled 2013-12-07 (×4): qty 1

## 2013-12-07 MED ORDER — ALBUTEROL SULFATE (2.5 MG/3ML) 0.083% IN NEBU
2.5000 mg | INHALATION_SOLUTION | Freq: Once | RESPIRATORY_TRACT | Status: AC
  Administered 2013-12-07: 2.5 mg via RESPIRATORY_TRACT
  Filled 2013-12-07: qty 3

## 2013-12-07 MED ORDER — GUAIFENESIN ER 600 MG PO TB12
1200.0000 mg | ORAL_TABLET | Freq: Two times a day (BID) | ORAL | Status: DC
  Administered 2013-12-07 – 2013-12-10 (×7): 1200 mg via ORAL
  Filled 2013-12-07 (×8): qty 2

## 2013-12-07 MED ORDER — VANCOMYCIN HCL IN DEXTROSE 750-5 MG/150ML-% IV SOLN
750.0000 mg | INTRAVENOUS | Status: DC
  Administered 2013-12-08 – 2013-12-09 (×2): 750 mg via INTRAVENOUS
  Filled 2013-12-07 (×3): qty 150

## 2013-12-07 MED ORDER — SODIUM CHLORIDE 0.9 % IJ SOLN: 3.0000 mL | INTRAMUSCULAR | Status: DC | PRN

## 2013-12-07 MED ORDER — ALBUTEROL SULFATE HFA 108 (90 BASE) MCG/ACT IN AERS: 2.0000 | INHALATION_SPRAY | RESPIRATORY_TRACT | Status: DC | PRN

## 2013-12-07 MED ORDER — SODIUM CHLORIDE 0.9 % IJ SOLN
3.0000 mL | Freq: Two times a day (BID) | INTRAMUSCULAR | Status: DC
  Administered 2013-12-07 – 2013-12-09 (×5): 3 mL via INTRAVENOUS

## 2013-12-07 MED ORDER — MULTIPLE VITAMIN PO TABS: ORAL_TABLET | Freq: Every day | ORAL | Status: DC

## 2013-12-07 MED ORDER — DEXTROSE 5 % IV SOLN: 1.0000 g | Freq: Two times a day (BID) | INTRAVENOUS | Status: DC

## 2013-12-07 MED ORDER — SODIUM CHLORIDE 0.9 % IV SOLN
250.0000 mL | INTRAVENOUS | Status: DC | PRN
  Administered 2013-12-07: 250 mL via INTRAVENOUS

## 2013-12-07 MED ORDER — ALBUTEROL SULFATE (2.5 MG/3ML) 0.083% IN NEBU: 2.5000 mg | INHALATION_SOLUTION | RESPIRATORY_TRACT | Status: DC | PRN

## 2013-12-07 MED ORDER — VANCOMYCIN HCL IN DEXTROSE 1-5 GM/200ML-% IV SOLN
1000.0000 mg | INTRAVENOUS | Status: AC
  Administered 2013-12-07: 1000 mg via INTRAVENOUS
  Filled 2013-12-07: qty 200

## 2013-12-07 MED ORDER — CEFEPIME HCL 1 G IJ SOLR
1.0000 g | INTRAMUSCULAR | Status: DC
  Filled 2013-12-07: qty 1

## 2013-12-07 MED ORDER — CALCIUM CARBONATE 1250 (500 CA) MG PO TABS
1250.0000 mg | ORAL_TABLET | Freq: Two times a day (BID) | ORAL | Status: DC
  Administered 2013-12-07: 1250 mg via ORAL
  Filled 2013-12-07 (×3): qty 1

## 2013-12-07 MED ORDER — DEXTROSE 5 % IV SOLN
1.0000 g | Freq: Two times a day (BID) | INTRAVENOUS | Status: DC
  Administered 2013-12-07 – 2013-12-10 (×7): 1 g via INTRAVENOUS
  Filled 2013-12-07 (×8): qty 1

## 2013-12-07 MED ORDER — PREDNISONE 20 MG PO TABS
40.0000 mg | ORAL_TABLET | Freq: Every day | ORAL | Status: DC
  Administered 2013-12-07 – 2013-12-10 (×4): 40 mg via ORAL
  Filled 2013-12-07 (×5): qty 2

## 2013-12-07 MED ORDER — MOMETASONE FURO-FORMOTEROL FUM 100-5 MCG/ACT IN AERO
2.0000 | INHALATION_SPRAY | Freq: Two times a day (BID) | RESPIRATORY_TRACT | Status: DC
  Administered 2013-12-07 – 2013-12-10 (×7): 2 via RESPIRATORY_TRACT
  Filled 2013-12-07: qty 8.8

## 2013-12-07 MED ORDER — HEPARIN SODIUM (PORCINE) 5000 UNIT/ML IJ SOLN
5000.0000 [IU] | Freq: Three times a day (TID) | INTRAMUSCULAR | Status: DC
  Administered 2013-12-07 – 2013-12-10 (×9): 5000 [IU] via SUBCUTANEOUS
  Filled 2013-12-07 (×13): qty 1

## 2013-12-07 NOTE — ED Notes (Signed)
Pt presents via EMS from independent living facility. Pt has a hx of bronchectasis and a MAC lung infection. Pt has a hx of coughing up blood but tonight pt had 2 episodes of this which is more than normal. No other complaints.

## 2013-12-07 NOTE — ED Provider Notes (Signed)
CSN: [id number]     Arrival date & time 12/07/13  0207 History   First MD Initiated Contact with Patient 12/07/13 [phone]     Chief Complaint  Patient presents with  . Hemoptysis     (Consider location/radiation/quality/duration/timing/severity/associated sxs/prior Treatment) HPI Comments: Patient with MAC, presents to the ED with a chief complaint of increasing hemoptysis.  She is followed by [facility] pulmonology.  She was seen earlier this week, and was started on levaquin for suspected superimposed infection.  She reports increased hemoptysis which is worsened after she lies down.  She denies fever, chills, chest pain, or other symptoms at this time.    The history is provided by the patient. No language interpreter was used.    Past Medical History  Diagnosis Date  . Allergic rhinitis   . Hyperlipidemia   . Chronic sinusitis   . Bronchiectasis   . Rheumatism    Past Surgical History  Procedure Laterality Date  . Tonsillectomy    . Cystocele repair    . Vesicovaginal fistula closure w/ tah     Family History  Problem Relation Age of Onset  . Emphysema Father   . Asthma Father   . Heart disease Father   . Clotting disorder Mother   . Breast cancer      Aunt   History  Substance Use Topics  . Smoking status: Never Smoker   . Smokeless tobacco: Never Used  . Alcohol Use: No   OB History   Grav Para Term Preterm Abortions TAB SAB Ect Mult Living                 Review of Systems  All other systems reviewed and are negative.     Allergies  Tequin  Home Medications   Prior to Admission medications   Medication Sig Start Date End Date Taking? Authorizing Provider  albuterol (PROVENTIL HFA;VENTOLIN HFA) 108 (90 BASE) MCG/ACT inhaler Inhale 2 puffs into the lungs every 4 (four) hours as needed for wheezing or shortness of breath.   Yes Historical Provider, MD  calcium carbonate (OS-CAL) 600 MG TABS Take 600 mg by mouth 2 (two) times daily with a meal.    Yes  Historical Provider, MD  levofloxacin (LEVAQUIN) 750 MG tablet Take 1 tablet (750 mg total) by mouth daily. 12/05/13 12/12/13 Yes Tammy S Parrett, NP  mometasone-formoterol (DULERA) 100-5 MCG/ACT AERO Inhale 2 puffs into the lungs 2 (two) times daily.   Yes Historical Provider, MD  MULTIPLE VITAMIN PO Take 1 tablet by mouth daily.    Yes Historical Provider, MD   BP 159/66  Pulse 92  Temp(Src) 98.5 F (36.9 C) (Oral)  Resp 15  Ht 5\' 4"  (1.626 m)  Wt 103 lb (46.72 kg)  BMI 17.67 kg/m2  SpO2 92% Physical Exam  Nursing note and vitals reviewed. Constitutional: She is oriented to person, place, and time. She appears well-developed and well-nourished.  HENT:  Head: Normocephalic and atraumatic.  Eyes: Conjunctivae and EOM are normal. Pupils are equal, round, and reactive to light.  Neck: Normal range of motion. Neck supple.  Cardiovascular: Normal rate and regular rhythm.  Exam reveals no gallop and no friction rub.   No murmur heard. Pulmonary/Chest: Effort normal and breath sounds normal. No respiratory distress. She has no wheezes. She has no rales. She exhibits no tenderness.  Crackles in left lower lobe  Abdominal: Soft. Bowel sounds are normal. She exhibits no distension and no mass. There is no tenderness. There is  no rebound and no guarding.  Musculoskeletal: Normal range of motion. She exhibits no edema and no tenderness.  Neurological: She is alert and oriented to person, place, and time.  Skin: Skin is warm and dry.  Psychiatric: She has a normal mood and affect. Her behavior is normal. Judgment and thought content normal.    ED Course  Procedures (including critical care time) Results for orders placed during the hospital encounter of 12/07/13  CBC WITH DIFFERENTIAL      Result Value Ref Range   WBC 12.5 (*) 4.0 - 10.5 K/uL   RBC 4.37  3.87 - 5.11 MIL/uL   Hemoglobin 13.4  12.0 - 15.0 g/dL   HCT 40.8  36.0 - 46.0 %   MCV 93.4  78.0 - 100.0 fL   MCH 30.7  26.0 - 34.0 pg    MCHC 32.8  30.0 - 36.0 g/dL   RDW 13.2  11.5 - 15.5 %   Platelets 272  150 - 400 K/uL   Neutrophils Relative % 74  43 - 77 %   Neutro Abs 9.3 (*) 1.7 - 7.7 K/uL   Lymphocytes Relative 14  12 - 46 %   Lymphs Abs 1.7  0.7 - 4.0 K/uL   Monocytes Relative 11  3 - 12 %   Monocytes Absolute 1.4 (*) 0.1 - 1.0 K/uL   Eosinophils Relative 1  0 - 5 %   Eosinophils Absolute 0.1  0.0 - 0.7 K/uL   Basophils Relative 0  0 - 1 %   Basophils Absolute 0.0  0.0 - 0.1 K/uL  BASIC METABOLIC PANEL      Result Value Ref Range   Sodium 136 (*) 137 - 147 mEq/L   Potassium 4.1  3.7 - 5.3 mEq/L   Chloride 99  96 - 112 mEq/L   CO2 27  19 - 32 mEq/L   Glucose, Bld 120 (*) 70 - 99 mg/dL   BUN 16  6 - 23 mg/dL   Creatinine, Ser 0.63  0.50 - 1.10 mg/dL   Calcium 10.0  8.4 - 10.5 mg/dL   GFR calc non Af Amer 78 (*) >90 mL/min   GFR calc Af Amer >90  >90 mL/min   Anion gap 10  5 - 15  I-STAT CG4 LACTIC ACID, ED      Result Value Ref Range   Lactic Acid, Venous 0.91  0.5 - 2.2 mmol/L   Dg Chest 2 View  12/07/2013   CLINICAL DATA:  Hemoptysis.  EXAM: CHEST  2 VIEW  COMPARISON:  Chest radiograph December 05, 2013, CT of the chest December 20, 2012  FINDINGS: Cardiac silhouette is upper limits of normal, mediastinal silhouette is nonsuspicious. Diffuse chronic interstitial changes with patchy right upper lobe airspace opacity. Scattered nodular densities. No pleural effusions. No pneumothorax. Soft tissue planes and included osseous structures are nonsuspicious. Multiple EKG lines overlie the patient and may obscure subtle underlying pathology.  IMPRESSION: Moderate to severe chronic interstitial lung disease with patchy right upper lobe airspace opacity, unchanged could reflect scarring and/or superimposed pneumonia. Additional scattered nodular densities. Recommend followup chest radiograph after treatment to verify improvement. If nodules persist, recommend followup CT of the chest.   Electronically Signed   By: Elon Alas   On: 12/07/2013 03:31   Dg Chest 2 View  12/05/2013   CLINICAL DATA:  80 year old female with hemoptysis cough and shortness of Breath. Initial encounter. History of bronchiectasis, mac.  EXAM: CHEST  2 VIEW  COMPARISON:  09/18/2013 and earlier.  FINDINGS: Scattered severe bronchiectasis demonstrated by CT in 2014. Associated scattered increased interstitial opacity with mild architectural distortion is again noted. Mild interval increased density in the periphery of the right upper lobe. No pulmonary edema. No definite pleural effusion. No pneumothorax. Stable cardiac size and mediastinal contours. Visualized tracheal air column is within normal limits. No acute osseous abnormality identified.  IMPRESSION: Moderate to severe chronic lung disease. Mildly increased opacity in the periphery of the right upper lobe since comparison suggesting acute infectious exacerbation.   Electronically Signed   By: Lars Pinks M.D.   On: 12/05/2013 10:53     Imaging Review Dg Chest 2 View  12/07/2013   CLINICAL DATA:  Hemoptysis.  EXAM: CHEST  2 VIEW  COMPARISON:  Chest radiograph December 05, 2013, CT of the chest December 20, 2012  FINDINGS: Cardiac silhouette is upper limits of normal, mediastinal silhouette is nonsuspicious. Diffuse chronic interstitial changes with patchy right upper lobe airspace opacity. Scattered nodular densities. No pleural effusions. No pneumothorax. Soft tissue planes and included osseous structures are nonsuspicious. Multiple EKG lines overlie the patient and may obscure subtle underlying pathology.  IMPRESSION: Moderate to severe chronic interstitial lung disease with patchy right upper lobe airspace opacity, unchanged could reflect scarring and/or superimposed pneumonia. Additional scattered nodular densities. Recommend followup chest radiograph after treatment to verify improvement. If nodules persist, recommend followup CT of the chest.   Electronically Signed   By: Elon Alas   On:  12/07/2013 03:31   Dg Chest 2 View  12/05/2013   CLINICAL DATA:  80 year old female with hemoptysis cough and shortness of Breath. Initial encounter. History of bronchiectasis, mac.  EXAM: CHEST  2 VIEW  COMPARISON:  09/18/2013 and earlier.  FINDINGS: Scattered severe bronchiectasis demonstrated by CT in 2014. Associated scattered increased interstitial opacity with mild architectural distortion is again noted. Mild interval increased density in the periphery of the right upper lobe. No pulmonary edema. No definite pleural effusion. No pneumothorax. Stable cardiac size and mediastinal contours. Visualized tracheal air column is within normal limits. No acute osseous abnormality identified.  IMPRESSION: Moderate to severe chronic lung disease. Mildly increased opacity in the periphery of the right upper lobe since comparison suggesting acute infectious exacerbation.   Electronically Signed   By: Lars Pinks M.D.   On: 12/05/2013 10:53     EKG Interpretation None      MDM   Final diagnoses:  MAC (mycobacterium avium-intracellulare complex)  Pneumonia, unspecified laterality, unspecified part of lung   Patient with MAC and possible superimposed infection.  Followed by [facility] pulm.  Patient seen by and discussed with Dr. Florina Ou, who recommends pulmonology consult.  5:26 AM  80 yo female with worsening hemoptysis.  Hx of MAC.  Followed by [facility] pulm.  Recently started on levaquin, but no improvement.  CXR concerning for superimposed infection.   Patient discussed with critical care/pulmonology, who will admit the patient.   Montine Circle, PA-C 12/07/13 [phone]

## 2013-12-07 NOTE — H&P (Signed)
Name: Destiny Rodriguez MRN: [id number] DOB: 10-14-1924    ADMISSION DATE:  12/07/2013 CONSULTATION DATE:  12/07/2013  REFERRING MD :  EDP PRIMARY SERVICE:  PCCM  CHIEF COMPLAINT:  Hemoptysis  BRIEF PATIENT DESCRIPTION: 80 y.o. F with hx of known bronchiectasis and MAC (dx in 2001), followed by [facility] as outpatient, has had cough with blood tinged mucus on and off for past month despite Rx with Augmentin and Levaquin.  Now having mild hemoptysis again, 3 - 4 teaspoons at a time.  Came to ED for concerns that it is getting worse.  PCCM consulted for admission.  SIGNIFICANT EVENTS / STUDIES:  7/15 - seen as outpatient, started on levaquin.  LINES / TUBES: PIV  CULTURES: 7/17 Sputum >>>   ANTIBIOTICS: Vanc 7/17 >>> Cefepime 7/17 >>>  HISTORY OF PRESENT ILLNESS:  Destiny Rodriguez is a 80 y.o. F with PMH as outlined below which includes bronchiectasis as well as MAC (dx 2001, s/p full treatment per ID 05/2011 - 11/2012).  She is followed by Dr. Gwenette Greet as an outpatient.  She has been having bronchiectasis flare for the past month or so and reports increase in cough along with blood tinged sputum.  She was seen in the office on 7/15 and started on Levaquin for possible PNA.  She presents to ED 7/17 for continued cough and slight increase in blood / hemoptysis, up to 4 - 5 teaspoons she feels.  She states that cough is worse when she lies down and she has had to use pillows when she sleeps to elevate her head.  She denies any fevers, chills, sweats, chest pain, SOB, abd pain, N/V/D.  No exposure to known sick contacts.  PAST MEDICAL HISTORY :  Past Medical History  Diagnosis Date  . Allergic rhinitis   . Hyperlipidemia   . Chronic sinusitis   . Bronchiectasis   . Rheumatism    Past Surgical History  Procedure Laterality Date  . Tonsillectomy    . Cystocele repair    . Vesicovaginal fistula closure w/ tah     Prior to Admission medications   Medication Sig Start Date End Date Taking?  Authorizing Provider  albuterol (PROVENTIL HFA;VENTOLIN HFA) 108 (90 BASE) MCG/ACT inhaler Inhale 2 puffs into the lungs every 4 (four) hours as needed for wheezing or shortness of breath.   Yes Historical Provider, MD  calcium carbonate (OS-CAL) 600 MG TABS Take 600 mg by mouth 2 (two) times daily with a meal.    Yes Historical Provider, MD  levofloxacin (LEVAQUIN) 750 MG tablet Take 1 tablet (750 mg total) by mouth daily. 12/05/13 12/12/13 Yes Tammy S Parrett, NP  mometasone-formoterol (DULERA) 100-5 MCG/ACT AERO Inhale 2 puffs into the lungs 2 (two) times daily.   Yes Historical Provider, MD  MULTIPLE VITAMIN PO Take 1 tablet by mouth daily.    Yes Historical Provider, MD   Allergies  Allergen Reactions  . Tequin Rash    FAMILY HISTORY:  Family History  Problem Relation Age of Onset  . Emphysema Father   . Asthma Father   . Heart disease Father   . Clotting disorder Mother   . Breast cancer      Aunt   SOCIAL HISTORY:  reports that she has never smoked. She has never used smokeless tobacco. She reports that she does not drink alcohol or use illicit drugs.  REVIEW OF SYSTEMS:   All negative; except for those that are bolded, which indicate positives.  Constitutional: weight loss, weight  gain, night sweats, fevers, chills, fatigue, weakness.  HEENT: headaches, sore throat, sneezing, nasal congestion, post nasal drip, difficulty swallowing, tooth/dental problems, visual complaints, visual changes, ear aches. Neuro: difficulty with speech, weakness, numbness, ataxia. CV:  chest pain, orthopnea, PND, swelling in lower extremities, dizziness, palpitations, syncope.  Resp: cough, hemoptysis, dyspnea, wheezing. GI  heartburn, indigestion, abdominal pain, nausea, vomiting, diarrhea, constipation, change in bowel habits, loss of appetite, hematemesis, melena, hematochezia.  GU: dysuria, change in color of urine, urgency or frequency, flank pain, hematuria. MSK: joint pain or swelling,  decreased range of motion. Psych: change in mood or affect, depression, anxiety, suicidal ideations, homicidal ideations. Skin: rash, itching, bruising.   SUBJECTIVE:   VITAL SIGNS: Temp:  [98.5 F (36.9 C)] 98.5 F (36.9 C) (07/17 0211) Pulse Rate:  [80-95] 84 (07/17 0535) Resp:  [15-28] 23 (07/17 0535) BP: (142-159)/(66-70) 142/70 mmHg (07/17 0535) SpO2:  [92 %-97 %] 94 % (07/17 0535) Weight:  [46.72 kg (103 lb)] 46.72 kg (103 lb) (07/17 0211)  PHYSICAL EXAMINATION: General: Pleasant elderly female, resting in bed, in NAD. Neuro: A&O x 3, non-focal.  HEENT: [place]/AT. PERRL, sclerae anicteric. Cardiovascular: RRR, no M/R/G.  Lungs: Respirations even and unlabored.  CTA bilaterally, No W/R/R. Abdomen: BS x 4, soft, NT/ND.  Musculoskeletal: No gross deformities, no edema.  Skin: Intact, warm, no rashes.     Recent Labs Lab 12/07/13 0347  NA 136*  K 4.1  CL 99  CO2 27  BUN 16  CREATININE 0.63  GLUCOSE 120*    Recent Labs Lab 12/07/13 0347  HGB 13.4  HCT 40.8  WBC 12.5*  PLT 272   Dg Chest 2 View  12/07/2013   CLINICAL DATA:  Hemoptysis.  EXAM: CHEST  2 VIEW  COMPARISON:  Chest radiograph December 05, 2013, CT of the chest December 20, 2012  FINDINGS: Cardiac silhouette is upper limits of normal, mediastinal silhouette is nonsuspicious. Diffuse chronic interstitial changes with patchy right upper lobe airspace opacity. Scattered nodular densities. No pleural effusions. No pneumothorax. Soft tissue planes and included osseous structures are nonsuspicious. Multiple EKG lines overlie the patient and may obscure subtle underlying pathology.  IMPRESSION: Moderate to severe chronic interstitial lung disease with patchy right upper lobe airspace opacity, unchanged could reflect scarring and/or superimposed pneumonia. Additional scattered nodular densities. Recommend followup chest radiograph after treatment to verify improvement. If nodules persist, recommend followup CT of the chest.    Electronically Signed   By: Elon Alas   On: 12/07/2013 03:31   Dg Chest 2 View  12/05/2013   CLINICAL DATA:  80 year old female with hemoptysis cough and shortness of Breath. Initial encounter. History of bronchiectasis, mac.  EXAM: CHEST  2 VIEW  COMPARISON:  09/18/2013 and earlier.  FINDINGS: Scattered severe bronchiectasis demonstrated by CT in 2014. Associated scattered increased interstitial opacity with mild architectural distortion is again noted. Mild interval increased density in the periphery of the right upper lobe. No pulmonary edema. No definite pleural effusion. No pneumothorax. Stable cardiac size and mediastinal contours. Visualized tracheal air column is within normal limits. No acute osseous abnormality identified.  IMPRESSION: Moderate to severe chronic lung disease. Mildly increased opacity in the periphery of the right upper lobe since comparison suggesting acute infectious exacerbation.   Electronically Signed   By: Lars Pinks M.D.   On: 12/05/2013 10:53    ASSESSMENT / PLAN:  Mild hemoptysis Likely bronchiectasis exacerbation / flare Possible PNA Plan: - Vanc / Cefepime. - Sputum culture. - Prednisone 40mg  QD. -  Guaifenesin. - Pulmonary toiletry. - Continue outpatient albuterol, dulera. - will notify Dr Gwenette Greet of her admission.    Montey Hora, [facility] Pgr: [phone]  or [phone] 12/07/2013, 5:55 AM  Baltazar Apo, MD, PhD 12/07/2013, 12:10 PM [facility] [phone] or if no answer [phone]

## 2013-12-07 NOTE — ED Notes (Signed)
MD at bedside. 

## 2013-12-07 NOTE — Progress Notes (Signed)
Clinical Social Work  CSW received inappropriate referral that patient was admitted from a facility. RN reports patient lives at home with husband and plans to return at [facility] is signing off but available if further needs arise.  [place], [place] [phone]

## 2013-12-07 NOTE — ED Notes (Signed)
Floor nurse in patient's room--unable to give report at this time Call back number provided

## 2013-12-07 NOTE — ED Notes (Signed)
Patient back from XR--patient remains in NAD

## 2013-12-07 NOTE — ED Provider Notes (Signed)
Medical screening examination/treatment/procedure(s) were conducted as a shared visit with non-physician practitioner(s) and myself.  I personally evaluated the patient during the encounter.  5:32 AM Patient in no acute distress. Expiratory wheezes in all lobes. Pulmonology has been consulted will see her in the ED.    Wynetta Fines, MD 12/07/13 [phone]

## 2013-12-07 NOTE — ED Notes (Signed)
Patient transported to X-ray via stretcher Patient in NAD upon leaving ED for testing

## 2013-12-07 NOTE — Progress Notes (Signed)
ANTIBIOTIC CONSULT NOTE - INITIAL  Pharmacy Consult for cefepime, vancomycin Indication: r/o PNA in setting of bronchiectasis flare  Allergies  Allergen Reactions  . Tequin Rash    Patient Measurements: Height: 5\' 4"  (162.6 cm) Weight: 103 lb (46.72 kg) IBW/kg (Calculated) : 54.7   Vital Signs: Temp: 98.5 F (36.9 C) (07/17 0211) Temp src: Oral (07/17 0211) BP: 142/70 mmHg (07/17 0535) Pulse Rate: 84 (07/17 0535) Intake/Output from previous day:   Intake/Output from this shift:    Labs:  Recent Labs  12/07/13 0347  WBC 12.5*  HGB 13.4  PLT 272  CREATININE 0.63   Estimated Creatinine Clearance: 36.5 ml/min (by C-G formula based on Cr of 0.63). No results found for this basename: VANCOTROUGH, Corlis Leak, VANCORANDOM, GENTTROUGH, GENTPEAK, GENTRANDOM, TOBRATROUGH, TOBRAPEAK, TOBRARND, AMIKACINPEAK, AMIKACINTROU, AMIKACIN,  in the last 72 hours   Microbiology: Recent Results (from the past 720 hour(s))  AFB CULTURE WITH SMEAR     Status: None   Collection Time    12/05/13 12:47 PM      Result Value Ref Range Status   Preliminary Report Culture will be examined for 6 weeks before    Preliminary   Preliminary Report issuing a Final Report.   Preliminary   Acid Fast Smear No Acid Fast Bacilli Seen   Preliminary    Medical History: Past Medical History  Diagnosis Date  . Allergic rhinitis   . Hyperlipidemia   . Chronic sinusitis   . Bronchiectasis   . Rheumatism     Medications:  Scheduled:  . calcium carbonate  600 mg Oral BID WC  . guaiFENesin  1,200 mg Oral BID  . heparin  5,000 Units Subcutaneous 3 times per day  . mometasone-formoterol  2 puff Inhalation BID  . Multiple Vitamin   Oral Q0600  . predniSONE  40 mg Oral Q breakfast  . sodium chloride  3 mL Intravenous Q12H   Infusions:  . sodium chloride    . ceFEPime (MAXIPIME) IV    . vancomycin     PRN: sodium chloride, albuterol, sodium chloride  Assessment: 80 y/o F with bronchiectasis and  MAC presented to ED with recurrent hemoptysis despite recent outpatient treatment with Augmentin and Levaquin.  CXR suggestive of possible superimposed pneumonia.  Orders received to begin empiric cefepime and vancomycin with pharmacy dosing assistance.   Goal of Therapy:  Appropriate antibiotic dosing for renal function; eradication of infection. Vancomycin trough 15-20   Plan:  1. Cefepime 1 gram IV stat, then q12h. 2. Vancomycin 1 gram IV stat, then 750 mg q24h 3. Follow SCr, culture data, clinical course. 4. Check vancomycin trough at steady-state unless able to de-escalate therapy in the interim.  Clayburn Pert, PharmD, BCPS Pager: [phone] 12/07/2013  6:30 AM

## 2013-12-07 NOTE — ED Notes (Signed)
Consult at bedside.

## 2013-12-07 NOTE — Progress Notes (Signed)
Ov reviewed, and agree with plan as outlined.  

## 2013-12-08 DIAGNOSIS — J449 Chronic obstructive pulmonary disease, unspecified: Secondary | ICD-10-CM

## 2013-12-08 NOTE — Progress Notes (Signed)
Subjective: Pt feels much better since being on IV abx.  Very little hemoptysis recently  Objective: Vital signs in last 24 hours: Blood pressure 142/74, pulse 95, temperature 97.8 F (36.6 C), temperature source Oral, resp. rate 20, height 5' 4.5" (1.638 m), weight 45.768 kg (100 lb 14.4 oz), SpO2 95.00%.  Intake/Output from previous day: 07/17 0701 - 07/18 0700 In: 720 [P.O.:720] Out: -    Physical Exam:   thin female in nad Nose without purulence or d/c noted. Neck without LN or TMG Chest with good airflow, no wheezing, scattered crackles that are chronic Cor with rrr LE without edema, no cyanosis Alert and oriented, moves all 4.        Assessment/Plan:  1) Bronchiectasis with acute exacerbation and secondary hemoptysis.  The pt is much improved with IV abx, and her hemoptysis is resolving.  I suspect her flare is not due to MAC, although the nodules on cxr are likely due to MAC.   -continue IV abx until culture results return.  She failed outpt abx?  May need iv abx at home at d/c  2) copd secondary to #1.  No bronchospasm or increased wob.   -continue home meds    Kathee Delton, M.D. 12/08/2013, 1:25 PM

## 2013-12-09 LAB — CULTURE, RESPIRATORY W GRAM STAIN: Culture: NORMAL

## 2013-12-09 LAB — CULTURE, RESPIRATORY

## 2013-12-09 NOTE — Progress Notes (Signed)
Subjective: Continues to do well.  Minimal to no hemoptysis  Objective: Vital signs in last 24 hours: Blood pressure 105/60, pulse 70, temperature 98 F (36.7 C), temperature source Oral, resp. rate 16, height 5' 4.5" (1.638 m), weight 45.768 kg (100 lb 14.4 oz), SpO2 97.00%.  Intake/Output from previous day: 07/18 0701 - 07/19 0700 In: 483 [P.O.:480; I.V.:3] Out: -    Physical Exam:   thin female in nad Nose without purulence or d/c noted. Neck without LN or TMG Chest with no wheezing, scattered crackles that are chronic Cor with rrr LE without edema, no cyanosis Alert and oriented, moves all 4.        Assessment/Plan:  1) Bronchiectasis with acute exacerbation and secondary hemoptysis.  The pt is much improved with IV abx, and her hemoptysis is resolving.  I suspect her flare is not due to MAC, although the nodules on cxr are likely due to MAC.   -continue IV abx until culture results return.  She failed outpt abx?  May need iv abx at home at d/c.  Culture is still not back today.  Treated with augmentin as outpt, then levaquin that she only took for a few days prior to being admitted.  Still has this at home.  2) copd secondary to #1.  No bronchospasm or increased wob.   -continue home meds    Kathee Delton, M.D. 12/09/2013, 12:03 PM

## 2013-12-10 ENCOUNTER — Telehealth: Payer: Self-pay | Admitting: Pulmonary Disease

## 2013-12-10 MED ORDER — PREDNISONE 10 MG PO TABS: ORAL_TABLET | ORAL | Status: DC

## 2013-12-10 MED ORDER — LEVOFLOXACIN 750 MG PO TABS: 750.0000 mg | ORAL_TABLET | Freq: Every day | ORAL | Status: DC

## 2013-12-10 MED ORDER — GUAIFENESIN ER 600 MG PO TB12: 1200.0000 mg | ORAL_TABLET | Freq: Two times a day (BID) | ORAL | Status: DC

## 2013-12-10 NOTE — Telephone Encounter (Signed)
Called and spoke with [facility] and she stated that the pt was d/c from the hospital today and Laurey Arrow wrote the rx for the prednisone.  The problem is that the pt has been on 40 mg of the pred but they have been giving her 2 tabs in the morning and 2 in the evening, so she did not get her 2nd dose today.  The pharmacist advised the pt to go ahead and take the 2nd dose tonight but the pt is worried that she will run short with the rx.  Is it ok to give the pt 2 extra tablets.  [place] please advise. Thanks  Allergies  Allergen Reactions  . Tequin Rash    Current Outpatient Prescriptions on File Prior to Visit  Medication Sig Dispense Refill  . albuterol (PROVENTIL HFA;VENTOLIN HFA) 108 (90 BASE) MCG/ACT inhaler Inhale 2 puffs into the lungs every 4 (four) hours as needed for wheezing or shortness of breath.      . calcium carbonate (OS-CAL) 600 MG TABS Take 600 mg by mouth 2 (two) times daily with a meal.       . guaiFENesin (MUCINEX) 600 MG 12 hr tablet Take 2 tablets (1,200 mg total) by mouth 2 (two) times daily.      Marland Kitchen levofloxacin (LEVAQUIN) 750 MG tablet Take 1 tablet (750 mg total) by mouth daily.  7 tablet  0  . levofloxacin (LEVAQUIN) 750 MG tablet Take 1 tablet (750 mg total) by mouth daily. Begin this after you complete the levaquin you already have at home  4 tablet  0  . mometasone-formoterol (DULERA) 100-5 MCG/ACT AERO Inhale 2 puffs into the lungs 2 (two) times daily.      . MULTIPLE VITAMIN PO Take 1 tablet by mouth daily.       . predniSONE (DELTASONE) 10 MG tablet Take 4 tabs  daily with food x 4 days, then 3 tabs daily x 4 days, then 2 tabs daily x 4 days, then 1 tab daily x4 days then stop. #40  40 tablet  0   No current facility-administered medications on file prior to visit.

## 2013-12-10 NOTE — Progress Notes (Signed)
Error   Marni Griffon ACNP-BC [place] Pager # [phone] OR # [phone] if no answer

## 2013-12-10 NOTE — Care Management Note (Signed)
    Page 1 of 1   12/10/2013     4:01:06 PM CARE MANAGEMENT NOTE 12/10/2013  Patient:  Destiny Rodriguez, Destiny Rodriguez   Account Number:  [id number]  Date Initiated:  12/10/2013  Documentation initiated by:  [facility]  Subjective/Objective Assessment:   80 year old female admitted with hemoptysis.     Action/Plan:   From home with spouse.   Anticipated DC Date:  12/10/2013   Anticipated DC Plan:  [place]  CM consult      Choice offered to / List presented to:             Status of service:  Completed, signed off Medicare Important Message given?  YES (If response is "NO", the following Medicare IM given date fields will be blank) Date Medicare IM given:  12/10/2013 Medicare IM given by:  [facility] Date Additional Medicare IM given:   Additional Medicare IM given by:    Discharge Disposition:    Per UR Regulation:    If discussed at Long Length of Stay Meetings, dates discussed:    Comments:

## 2013-12-10 NOTE — Discharge Summary (Signed)
Physician Discharge Summary       Patient ID: Destiny Rodriguez MRN: [id number] DOB/AGE: 06/27/1924 80 y.o.  Admit date: 12/07/2013 Discharge date: 12/10/2013  Discharge Diagnoses:  Acute exacerbation o fbronchiectasis  Hemoptysis  COPD  Detailed Hospital Course:   Destiny Rodriguez is a 80 y.o. F with PMH as outlined below which includes bronchiectasis as well as MAC (dx 2001, s/p full treatment per ID 05/2011 - 11/2012). She is followed by Dr. Gwenette Greet as an outpatient. She has been having bronchiectasis flare for the past month or so and reports increase in cough along with blood tinged sputum. She was seen in the office on 7/15 and started on Levaquin for possible PNA. She presented to ED 7/17 for continued cough and slight increase in blood / hemoptysis, up to 4 - 5 teaspoons she feels. She stated that cough is worse when she lies down and she has had to use pillows when she sleeps to elevate her head. She denied any fevers, chills, sweats, chest pain, SOB, abd pain, N/V/D. No exposure to known sick contacts.  She was admitted to the in-pt setting. Therapeutic interventions included: scheduled BDs, systemic steroids and empiric antibiotics. Cultures were obtained. Sputum grew normal flora. AFB was negative to date. She improved remarkably with IV antibiotics. Had discussion with Dr Gwenette Greet about out-pt regimen. She was ready for d/c as of 7/20 with the plan as outlined below.     Discharge Plan by diagnoses   Bronchiectasis with acute exacerbation and secondary hemoptysis.  The pt is much improved with IV abx, and her hemoptysis is resolving. Unclear if flare is due to MAC, although the nodules on cxr are likely due to MAC.  plan   -Change to oral levaquin 10 more days, if gets worse would schedule for FOB.   -PT had difficulty w/ treatment regimen for MAC last round w. Marked muscular weakness which required her to  stop her treatment 2 months early, doubt she has reservations about doing that  ever again.   -pred taper   -ROV on Aug 4 at 430, moved this up from Aug 7th   copd secondary to #1. No bronchospasm or increased wob.  plan   -continue home meds   Significant Hospital tests/ studies/ interventions and procedures   Micro  Sputum culture 7/17: normal flora  Blood culture 7/17: >>>  ABX  Cefepime 7/17>>>  vanc 7/17>>>  Discharge Exam: BP 113/61  Pulse 75  Temp(Src) 97.7 F (36.5 C) (Oral)  Resp 18  Ht 5' 4.5" (1.638 m)  Wt 45.768 kg (100 lb 14.4 oz)  BMI 17.06 kg/m2  SpO2 92% Room air  thin female in nad  Nose without purulence or d/c noted.  Neck without LN or TMG  Chest with no wheezing, scattered crackles  Cor with rrr  LE without edema, no cyanosis  Alert and oriented, moves all 4.   Labs at discharge Lab Results  Component Value Date   CREATININE 0.63 12/07/2013   BUN 16 12/07/2013   NA 136* 12/07/2013   K 4.1 12/07/2013   CL 99 12/07/2013   CO2 27 12/07/2013   Lab Results  Component Value Date   WBC 12.5* 12/07/2013   HGB 13.4 12/07/2013   HCT 40.8 12/07/2013   MCV 93.4 12/07/2013   PLT 272 12/07/2013   Lab Results  Component Value Date   ALT 12 10/17/2012   AST 15 10/17/2012   ALKPHOS 94 10/17/2012   BILITOT 0.3 10/17/2012  No results found for this basename: INR,  PROTIME    Current radiology studies No results found.  Disposition:home         Discharge Instructions   Diet - low sodium heart healthy    Complete by:  As directed      Increase activity slowly    Complete by:  As directed             Medication List         albuterol 108 (90 BASE) MCG/ACT inhaler  Commonly known as:  PROVENTIL HFA;VENTOLIN HFA  Inhale 2 puffs into the lungs every 4 (four) hours as needed for wheezing or shortness of breath.     calcium carbonate 600 MG Tabs tablet  Commonly known as:  OS-CAL  Take 600 mg by mouth 2 (two) times daily with a meal.     guaiFENesin 600 MG 12 hr tablet  Commonly known as:  MUCINEX  Take 2 tablets  (1,200 mg total) by mouth 2 (two) times daily.     levofloxacin 750 MG tablet  Commonly known as:  LEVAQUIN  Take 1 tablet (750 mg total) by mouth daily.     levofloxacin 750 MG tablet  Commonly known as:  LEVAQUIN  Take 1 tablet (750 mg total) by mouth daily. Begin this after you complete the levaquin you already have at home     mometasone-formoterol 100-5 MCG/ACT Aero  Commonly known as:  DULERA  Inhale 2 puffs into the lungs 2 (two) times daily.     MULTIPLE VITAMIN PO  Take 1 tablet by mouth daily.     predniSONE 10 MG tablet  Commonly known as:  DELTASONE  Take 4 tabs  daily with food x 4 days, then 3 tabs daily x 4 days, then 2 tabs daily x 4 days, then 1 tab daily x4 days then stop. #40       Follow-up Information   Follow up with Kathee Delton, MD On 12/25/2013. (430 pm )    Specialty:  Pulmonary Disease   Contact information:   [address] [place]  [address] [phone]       Discharged Condition: good  Physician Statement:   The Patient was personally examined, the discharge assessment and plan has been personally reviewed and I agree with ACNP Babcock's assessment and plan. > 30 minutes of time have been dedicated to discharge assessment, planning and discharge instructions.   SignedMarni Griffon 12/10/2013, 10:11 AM  Chesley Mires, MD [facility] Pulmonary/Critical Care 12/10/2013, 11:42 AM Pager:  [phone] After 3pm call: [phone]

## 2013-12-11 NOTE — Telephone Encounter (Signed)
It is ok, but in reality, it is not a big deal.  Ok for her course to be 2 pills short.

## 2013-12-11 NOTE — Telephone Encounter (Signed)
Spoke with Maudie Mercury at [employer]. Aware that per [facility], okay to give 2 extra tablets to complete Prednisone Rx Kim to contact patient to come pick up rest of Rx Nothing further needed.

## 2013-12-13 LAB — CULTURE, BLOOD (ROUTINE X 2)
CULTURE: NO GROWTH
Culture: NO GROWTH

## 2013-12-25 ENCOUNTER — Encounter: Payer: Self-pay | Admitting: Pulmonary Disease

## 2013-12-25 ENCOUNTER — Ambulatory Visit (INDEPENDENT_AMBULATORY_CARE_PROVIDER_SITE_OTHER): Payer: Medicare Other | Admitting: Pulmonary Disease

## 2013-12-25 VITALS — BP 116/70 | HR 87 | Temp 97.6°F | Ht 64.5 in | Wt 100.2 lb

## 2013-12-25 DIAGNOSIS — A310 Pulmonary mycobacterial infection: Secondary | ICD-10-CM

## 2013-12-25 DIAGNOSIS — J479 Bronchiectasis, uncomplicated: Secondary | ICD-10-CM | POA: Diagnosis not present

## 2013-12-25 DIAGNOSIS — J449 Chronic obstructive pulmonary disease, unspecified: Secondary | ICD-10-CM

## 2013-12-25 NOTE — Assessment & Plan Note (Signed)
The patient is currently back to her usual baseline from a bronchiectasis standpoint. I have asked her to stay on her vibratory vest, and to remain as active as possible.

## 2013-12-25 NOTE — Patient Instructions (Signed)
Stay on dulera, and also your vibratory vest. Get back to your usual active schedule. followup with me again in 40mos.

## 2013-12-25 NOTE — Assessment & Plan Note (Signed)
The patient continues to do well on dulera, and did not really have an acute flare of her obstructive lung disease while in the hospital.

## 2013-12-25 NOTE — Progress Notes (Signed)
   Subjective:    Patient ID: Destiny Rodriguez, female    DOB: Mar 30, 1925, 80 y.o.   MRN: [id number]  HPI The patient comes in today for followup of her known bronchiectasis and underlying COPD. She also has MAC colonization with radiographic changes in the form of nodular densities. She was recently in the hospital with an acute exacerbation, and required IV antibiotics in order to resolve her congestion and hemoptysis. Unfortunately, she did not grow a specific organism on her culture and her AFB smear was negative. The patient is doing very well since discharge, and feels that she is back to her usual baseline. She denies any significant cough, chest congestion, or hemoptysis. She feels that she is breathing well.   Review of Systems  Constitutional: Negative for fever and unexpected weight change.  HENT: Negative for congestion, dental problem, ear pain, nosebleeds, postnasal drip, rhinorrhea, sinus pressure, sneezing, sore throat and trouble swallowing.   Eyes: Negative for redness and itching.  Respiratory: Negative for cough, chest tightness, shortness of breath and wheezing.   Cardiovascular: Negative for palpitations and leg swelling.  Gastrointestinal: Negative for nausea and vomiting.  Genitourinary: Negative for dysuria.  Musculoskeletal: Negative for joint swelling.  Skin: Negative for rash.  Neurological: Negative for headaches.  Hematological: Does not bruise/bleed easily.  Psychiatric/Behavioral: Negative for dysphoric mood. The patient is not nervous/anxious.        Objective:   Physical Exam Thin female in no acute distress Nose without purulence or discharge noted Neck without lymphadenopathy or thyromegaly Chest with a rare crackle, but good airflow and no wheezes or rhonchi Cardiac exam with regular rate and rhythm Lower extremities without edema, no cyanosis Alert and oriented, moves all 4 extremities.       Assessment & Plan:

## 2013-12-26 ENCOUNTER — Encounter: Payer: Self-pay | Admitting: Pulmonary Disease

## 2013-12-28 ENCOUNTER — Ambulatory Visit: Payer: Medicare Other | Admitting: Pulmonary Disease

## 2014-01-11 DIAGNOSIS — Z23 Encounter for immunization: Secondary | ICD-10-CM | POA: Diagnosis not present

## 2014-01-11 DIAGNOSIS — E441 Mild protein-calorie malnutrition: Secondary | ICD-10-CM | POA: Diagnosis not present

## 2014-01-11 DIAGNOSIS — J479 Bronchiectasis, uncomplicated: Secondary | ICD-10-CM | POA: Diagnosis not present

## 2014-01-11 DIAGNOSIS — R609 Edema, unspecified: Secondary | ICD-10-CM | POA: Diagnosis not present

## 2014-01-16 ENCOUNTER — Ambulatory Visit: Payer: Medicare Other | Admitting: Pulmonary Disease

## 2014-01-18 DIAGNOSIS — Z1231 Encounter for screening mammogram for malignant neoplasm of breast: Secondary | ICD-10-CM | POA: Diagnosis not present

## 2014-01-18 DIAGNOSIS — Z803 Family history of malignant neoplasm of breast: Secondary | ICD-10-CM | POA: Diagnosis not present

## 2014-01-24 ENCOUNTER — Encounter: Payer: Self-pay | Admitting: Internal Medicine

## 2014-01-24 ENCOUNTER — Ambulatory Visit (INDEPENDENT_AMBULATORY_CARE_PROVIDER_SITE_OTHER): Payer: Medicare Other | Admitting: Internal Medicine

## 2014-01-24 VITALS — BP 129/80 | HR 86 | Temp 97.8°F | Wt 100.0 lb

## 2014-01-24 DIAGNOSIS — A318 Other mycobacterial infections: Secondary | ICD-10-CM

## 2014-01-24 DIAGNOSIS — A310 Pulmonary mycobacterial infection: Secondary | ICD-10-CM

## 2014-01-24 DIAGNOSIS — R042 Hemoptysis: Secondary | ICD-10-CM

## 2014-01-25 NOTE — Progress Notes (Signed)
Subjective:    Patient ID: Destiny Rodriguez, female    DOB: 1924/11/14, 80 y.o.   MRN: [id number]  HPI [name] is an 80yo F with hx of MAC pulmonary disease, bronchiectasis, chronic sinusitis who was admitted on 7/17-7/20 for hemoptysis. Prior to her admission she states that she felt she had  Never really recovered from having a cold/URI that she noted in April-May, would have prolonged coughing fits without much improvement with her inhalers, in early July, she would notice having a teaspoon or 2 of hemoptysis, that progressively worsened. She was given a course of amox/clav which did not improve her symptoms, then converted to taking fluoroquinolone. On the evening of 7/17, due to having 3-4 tablespoons of hemoptysis, she went to ED for evaluations. Her symptoms improved once she was switched to IV antibiotics. cx did not show bacteria, she did not re-isolate MAC but m.gordonae was found in a respiratory culture, thought to be environmental contaminant. She states that she has not had further hemoptysis, her cough is much improved in the last 2 months since her hospitalization. She has not gained back the weight she has lost since we saw her this Spring.   Current Outpatient Prescriptions on File Prior to Visit  Medication Sig Dispense Refill  . albuterol (PROVENTIL HFA;VENTOLIN HFA) 108 (90 BASE) MCG/ACT inhaler Inhale 2 puffs into the lungs every 4 (four) hours as needed for wheezing or shortness of breath.      . calcium carbonate (OS-CAL) 600 MG TABS Take 600 mg by mouth 2 (two) times daily with a meal.       . mometasone-formoterol (DULERA) 100-5 MCG/ACT AERO Inhale 2 puffs into the lungs 2 (two) times daily.      . MULTIPLE VITAMIN PO Take 1 tablet by mouth daily.       . predniSONE (DELTASONE) 10 MG tablet Take 4 tabs  daily with food x 4 days, then 3 tabs daily x 4 days, then 2 tabs daily x 4 days, then 1 tab daily x4 days then stop. #40  40 tablet  0  . guaiFENesin (MUCINEX) 600 MG 12 hr  tablet Take 2 tablets (1,200 mg total) by mouth 2 (two) times daily.       No current facility-administered medications on file prior to visit.   Active Ambulatory Problems    Diagnosis Date Noted  . Pulmonary diseases due to other mycobacteria 06/07/2008  . HYPERLIPIDEMIA 03/25/2007  . SINUSITIS, CHRONIC 06/07/2008  . ALLERGIC RHINITIS 06/07/2008  . Bronchiectasis without acute exacerbation 03/25/2007  . COPD, MILD 06/07/2008  . HIATAL HERNIA 03/25/2007  . Black stool 11/23/2010  . MAC (mycobacterium avium-intracellulare complex) 05/27/2011  . Pneumonia 12/06/2013  . Hemoptysis 12/07/2013   Resolved Ambulatory Problems    Diagnosis Date Noted  . UNSPECIFIED BACTERIAL PNEUMONIA 04/01/2010  . Hemoptysis 10/15/2008  . HEMOPTYSIS UNSPECIFIED 04/01/2010  . Hemoptysis 11/23/2010   Past Medical History  Diagnosis Date  . Allergic rhinitis   . Hyperlipidemia   . Chronic sinusitis   . Bronchiectasis   . Rheumatism       Review of Systems 10 point ros is negative per review    Objective:   Physical Exam BP 129/80  Pulse 86  Temp(Src) 97.8 F (36.6 C) (Oral)  Wt 100 lb (45.36 kg) Physical Exam  Constitutional:  oriented to person, place, and time. appears well-developed and well-nourished. No distress.  HENT:  Mouth/Throat: Oropharynx is clear and moist. No oropharyngeal exudate.  Cardiovascular: Normal rate,  regular rhythm and normal heart sounds. Exam reveals no gallop and no friction rub.  No murmur heard.  Pulmonary/Chest: Effort normal and breath sounds normal. No respiratory distress.  has no wheezes.  Lymphadenopathy: no cervical adenopathy.  Neurological: alert and oriented to person, place, and time.  Skin: Skin is warm and dry. No rash noted. No erythema.  Psychiatric: a normal mood and affect. behavior is normal.         Assessment & Plan:  Pulmonary MAC = had received prolonged therapy last year. Will watch for further isolation or worsening symptoms.  Presently, no need for treatment  M.gordonae = can often be contaminant. Will not treat at this time.  Bronchiectasis = recently hospitalized for exacerbation which is now improved. Continue with her maintenance inhalers  Health maintenance = recommend to get enhanced HD flu vaccine, unfortunately, unavailable at our clinic rtc in 3 months

## 2014-02-06 DIAGNOSIS — Z23 Encounter for immunization: Secondary | ICD-10-CM | POA: Diagnosis not present

## 2014-02-07 ENCOUNTER — Telehealth: Payer: Self-pay | Admitting: *Deleted

## 2014-02-07 NOTE — Progress Notes (Signed)
Quick Note:  See 9.17.15 phone note - have contacted [facility] to see if sample can be sent back to check for sensitivities. ______

## 2014-02-07 NOTE — Telephone Encounter (Signed)
Message copied by Rinaldo Ratel on Thu Feb 07, 2014  2:23 PM ------      Message from: Melvenia Needles      Created: Fri Feb 01, 2014  3:24 PM       Seen by ID       Discussed with Dr. Gwenette Greet send for sensitivities       Please call lab ------

## 2014-02-07 NOTE — Telephone Encounter (Signed)
Called [facility], spoke with Remo Lipps.  Per Remo Lipps the department that would be able to see if sample can be sent back to check for sensitivities is closed for lunch.  Left office number and my name, someone will call me back.  Will leave in my inbasket.

## 2014-02-07 NOTE — Telephone Encounter (Signed)
Spoke with Peter Congo with [facility] I advised that the sample needs to be sent for sensitivities  She states that she will take care of this  Nothing further needed

## 2014-03-04 ENCOUNTER — Other Ambulatory Visit: Payer: Self-pay | Admitting: Pulmonary Disease

## 2014-03-11 DIAGNOSIS — Z1389 Encounter for screening for other disorder: Secondary | ICD-10-CM | POA: Diagnosis not present

## 2014-03-11 DIAGNOSIS — J479 Bronchiectasis, uncomplicated: Secondary | ICD-10-CM | POA: Diagnosis not present

## 2014-03-11 DIAGNOSIS — M810 Age-related osteoporosis without current pathological fracture: Secondary | ICD-10-CM | POA: Diagnosis not present

## 2014-03-11 DIAGNOSIS — A310 Pulmonary mycobacterial infection: Secondary | ICD-10-CM | POA: Diagnosis not present

## 2014-03-11 DIAGNOSIS — E780 Pure hypercholesterolemia: Secondary | ICD-10-CM | POA: Diagnosis not present

## 2014-03-11 DIAGNOSIS — Z0000 Encounter for general adult medical examination without abnormal findings: Secondary | ICD-10-CM | POA: Diagnosis not present

## 2014-03-11 DIAGNOSIS — E46 Unspecified protein-calorie malnutrition: Secondary | ICD-10-CM | POA: Diagnosis not present

## 2014-03-13 LAB — AFB CULTURE WITH SMEAR (NOT AT ARMC): ACID FAST SMEAR: NONE SEEN

## 2014-03-20 LAB — REFERRED ASSAY

## 2014-04-05 ENCOUNTER — Telehealth: Payer: Self-pay | Admitting: Adult Health

## 2014-04-05 NOTE — Telephone Encounter (Signed)
Sensitivities received on AFB sputum w/ culture Per TP: begin Biaxin XL x10days, take with food, eat yogurt.  Has not had any trouble since last admission in July Would like to make sure that TP would still like her to take the abx.  Pt is aware TP is not in the office today and is happy with a call back next week  TP please advise, thank you.

## 2014-04-06 NOTE — Telephone Encounter (Signed)
That is fine , hold on therapy  follow up with Dr. Gwenette Greet and discuss on return  Call if sx develop before ov .

## 2014-04-08 NOTE — Telephone Encounter (Signed)
Called spoke with patient, advised of TP's recs to hold on therapy Pt okay with this and voiced her understanding She is to keep her 12/1 appt with [facility] and call for sooner follow up if symptoms develop prior to ov Nothing further needed; will sign off

## 2014-04-23 ENCOUNTER — Encounter: Payer: Self-pay | Admitting: Pulmonary Disease

## 2014-04-23 ENCOUNTER — Ambulatory Visit (INDEPENDENT_AMBULATORY_CARE_PROVIDER_SITE_OTHER): Payer: Medicare Other | Admitting: Pulmonary Disease

## 2014-04-23 VITALS — BP 122/72 | HR 78 | Temp 97.0°F | Ht 64.5 in | Wt 106.0 lb

## 2014-04-23 DIAGNOSIS — J449 Chronic obstructive pulmonary disease, unspecified: Secondary | ICD-10-CM | POA: Diagnosis not present

## 2014-04-23 DIAGNOSIS — A310 Pulmonary mycobacterial infection: Secondary | ICD-10-CM

## 2014-04-23 DIAGNOSIS — J479 Bronchiectasis, uncomplicated: Secondary | ICD-10-CM | POA: Diagnosis not present

## 2014-04-23 MED ORDER — NYSTATIN 100000 UNIT/ML MT SUSP: 4.0000 mL | Freq: Three times a day (TID) | OROMUCOSAL | Status: DC

## 2014-04-23 NOTE — Patient Instructions (Signed)
Will treat with nystatin mouthwash for next 5 days to help with your thrush Try using your dulera with a spacer to see if it helps with your mouth irritation Will see you back again in 43mos to check on things, but please call if having a change in symptoms.

## 2014-04-23 NOTE — Assessment & Plan Note (Signed)
The patient has not had a recent chest infection, and currently has very little mucus and no purulence. She has also not had any hemoptysis.

## 2014-04-23 NOTE — Assessment & Plan Note (Signed)
The patient is doing well from a breathing standpoint on her current inhaler regimen, and enjoys a very active lifestyle. She is having some thrush issues, and I will therefore treat her with nystatin and also try using a spacer.

## 2014-04-23 NOTE — Progress Notes (Signed)
   Subjective:    Patient ID: Destiny Rodriguez, female    DOB: 1924/10/16, 80 y.o.   MRN: [id number]  HPI The patient comes in today for follow-up of her known bronchiectasis, complicated by chronic obstructive lung disease and MAC colonization. She also has had a recent [employer] culture grow M. Gordonae, which is felt to be a colonizer. She has done extremely well since the last visit, with no flareup, increased cough, and minimal mucus. She is continuing on her bronchodilator regimen and her vibratory best. She feels that her breathing is at a very good baseline currently. She is having some issues with mouth soreness, probably from thrush. She does not use a spacer device with her inhaler.   Review of Systems  Constitutional: Negative for fever and unexpected weight change.  HENT: Positive for congestion, postnasal drip, rhinorrhea and sneezing. Negative for dental problem, ear pain, nosebleeds, sinus pressure, sore throat and trouble swallowing.   Eyes: Negative for redness and itching.  Respiratory: Positive for cough. Negative for chest tightness, shortness of breath and wheezing.   Cardiovascular: Negative for palpitations and leg swelling.  Gastrointestinal: Negative for nausea and vomiting.  Genitourinary: Negative for dysuria.  Musculoskeletal: Negative for joint swelling.  Skin: Negative for rash.  Neurological: Negative for headaches.  Hematological: Does not bruise/bleed easily.  Psychiatric/Behavioral: Negative for dysphoric mood. The patient is not nervous/anxious.        Objective:   Physical Exam Thin female in no acute distress Nose without purulence or discharge noted Neck without lymphadenopathy or thyromegaly Chest with fairly clear breath sounds, no wheezing Cardiac exam with regular rate and rhythm Lower extremities without edema, no cyanosis Alert and oriented, moves all 4 extremities.       Assessment & Plan:

## 2014-04-23 NOTE — Assessment & Plan Note (Signed)
The patient continues to do fairly well with no progressive symptoms or progressive radiographic changes. We are continuing to monitor this, and she is also being followed by infectious disease.

## 2014-04-24 DIAGNOSIS — H1013 Acute atopic conjunctivitis, bilateral: Secondary | ICD-10-CM | POA: Diagnosis not present

## 2014-04-25 ENCOUNTER — Ambulatory Visit (INDEPENDENT_AMBULATORY_CARE_PROVIDER_SITE_OTHER): Payer: Medicare Other | Admitting: Internal Medicine

## 2014-04-25 VITALS — BP 152/82 | HR 72 | Temp 97.5°F | Wt 105.0 lb

## 2014-04-25 DIAGNOSIS — A310 Pulmonary mycobacterial infection: Secondary | ICD-10-CM

## 2014-04-29 NOTE — Progress Notes (Signed)
Subjective:    Patient ID: Destiny Rodriguez, female    DOB: 1925/03/14, 80 y.o.   MRN: [id number]  HPI  Destiny Rodriguez is an 80yo F with hx of MAC pulmonary disease, bronchiectasis, chronic sinusitis who was admitted on 7/17-7/20 for hemoptysis. cx did not isolate MAC but found to have m.gordonae. On repeat sputum cx no NTM found. M.Gordonae, thought to be environmental contaminant. She states that since we last saw her in early September 2015 she has been doing well. She denies any recent respriatory illnesses. Cough under good control. She is also gaining roughly 5 lb since we last saw her. She weighs the most than she previously has been in the given year. She recently saw dr. Gwenette Greet who also was impressed with her improvement  Current Outpatient Prescriptions on File Prior to Visit  Medication Sig Dispense Refill  . albuterol (PROVENTIL HFA;VENTOLIN HFA) 108 (90 BASE) MCG/ACT inhaler Inhale 2 puffs into the lungs every 4 (four) hours as needed for wheezing or shortness of breath.    . Biotin 1000 MCG tablet Take 1,000 mcg by mouth daily.    . calcium carbonate (OS-CAL) 600 MG TABS Take 600 mg by mouth 2 (two) times daily with a meal.     . chlorpheniramine (CHLOR-TRIMETON) 4 MG tablet Take 4 mg by mouth at bedtime.    . DULERA 100-5 MCG/ACT AERO INHALE 2 PUFFS TWICE DAILY 13 g 4  . guaiFENesin (MUCINEX) 600 MG 12 hr tablet Take 2 tablets (1,200 mg total) by mouth 2 (two) times daily. (Patient taking differently: Take 1,200 mg by mouth 2 (two) times daily as needed. )    . MULTIPLE VITAMIN PO Take 1 tablet by mouth daily.     Marland Kitchen nystatin (MYCOSTATIN) 100000 UNIT/ML suspension Take 4 mLs (400,000 Units total) by mouth 3 (three) times daily. X 5 days swish and swallow 60 mL 0   No current facility-administered medications on file prior to visit.   Active Ambulatory Problems    Diagnosis Date Noted  . Mycobacterium avium-intracellulare complex 06/07/2008  . HYPERLIPIDEMIA 03/25/2007  .  SINUSITIS, CHRONIC 06/07/2008  . ALLERGIC RHINITIS 06/07/2008  . Bronchiectasis without acute exacerbation 03/25/2007  . COPD (chronic obstructive pulmonary disease) with chronic bronchitis 06/07/2008  . HIATAL HERNIA 03/25/2007  . Black stool 11/23/2010  . Pneumonia 12/06/2013  . Hemoptysis 12/07/2013   Resolved Ambulatory Problems    Diagnosis Date Noted  . UNSPECIFIED BACTERIAL PNEUMONIA 04/01/2010  . Hemoptysis 10/15/2008  . HEMOPTYSIS UNSPECIFIED 04/01/2010  . Hemoptysis 11/23/2010  . MAC (mycobacterium avium-intracellulare complex) 05/27/2011   Past Medical History  Diagnosis Date  . Allergic rhinitis   . Hyperlipidemia   . Chronic sinusitis   . Bronchiectasis   . Rheumatism      Review of Systems  Constitutional: Negative for fever, chills, diaphoresis, activity change, appetite change, fatigue and unexpected weight change.  HENT: Negative for congestion, sore throat, rhinorrhea, sneezing, trouble swallowing and sinus pressure.  Eyes: Negative for photophobia and visual disturbance.  Respiratory: Negative for cough, chest tightness, shortness of breath, wheezing and stridor.  Cardiovascular: Negative for chest pain, palpitations and leg swelling.  Gastrointestinal: Negative for nausea, vomiting, abdominal pain, diarrhea, constipation, blood in stool, abdominal distention and anal bleeding.  Genitourinary: Negative for dysuria, hematuria, flank pain and difficulty urinating.  Musculoskeletal: Negative for myalgias, back pain, joint swelling, arthralgias and gait problem.  Skin: Negative for color change, pallor, rash and wound.  Neurological: Negative for dizziness, tremors, weakness and light-headedness.  Hematological: Negative for adenopathy. Does not bruise/bleed easily.  Psychiatric/Behavioral: Negative for behavioral problems, confusion, sleep disturbance, dysphoric mood, decreased concentration and agitation.       Objective:   Physical Exam  BP 152/82 mmHg   Pulse 72  Temp(Src) 97.5 F (36.4 C) (Oral)  Wt 105 lb (47.628 kg) Physical Exam  Constitutional:  oriented to person, place, and time. appears well-developed and well-nourished. No distress. Thin elderly female, in good spirits HENT:  Mouth/Throat: Oropharynx is clear and moist. No oropharyngeal exudate.  Cardiovascular: Normal rate, regular rhythm and normal heart sounds. Exam reveals no gallop and no friction rub.  No murmur heard.  Pulmonary/Chest: Effort normal and breath sounds normal. No respiratory distress.  has no wheezes.  Abdominal: Soft. Bowel sounds are normal.  exhibits no distension. There is no tenderness.  Lymphadenopathy: no cervical adenopathy.  Neurological: alert and oriented to person, place, and time.  Skin: Skin is warm and dry. No rash noted. No erythema.  Psychiatric: a normal mood and affect. His behavior is normal.        Assessment & Plan:  Pulmonary MAC with bronchiectesus, previously treated = she appears to be doing well since her hospitalization in July 2015. For now, continue to monitor symptoms. Appears to be doing well. Will see her back in 3 months to see if she has any need for further investigation or decision to reinitiate treatment for MAC.  rtc in 3 months

## 2014-06-12 ENCOUNTER — Encounter: Payer: Self-pay | Admitting: Pulmonary Disease

## 2014-07-01 DIAGNOSIS — H1013 Acute atopic conjunctivitis, bilateral: Secondary | ICD-10-CM | POA: Diagnosis not present

## 2014-07-01 DIAGNOSIS — T1511XA Foreign body in conjunctival sac, right eye, initial encounter: Secondary | ICD-10-CM | POA: Diagnosis not present

## 2014-07-05 ENCOUNTER — Telehealth: Payer: Self-pay | Admitting: Pulmonary Disease

## 2014-07-05 MED ORDER — AMOXICILLIN-POT CLAVULANATE 875-125 MG PO TABS: 1.0000 | ORAL_TABLET | Freq: Two times a day (BID) | ORAL | Status: DC

## 2014-07-05 NOTE — Telephone Encounter (Signed)
Called pt and aware of recs. Nothing further needed

## 2014-07-05 NOTE — Telephone Encounter (Signed)
Spoke with pt, c/o temp of 100, sinus congestion, pnd, sob with exertion, prod cough with clear/white mucus X2 days.  Pt uses [facility].  [place] please advise on recs.  Thank you.

## 2014-07-05 NOTE — Telephone Encounter (Signed)
Call in augmentin 875 one bid for 7 days

## 2014-07-25 ENCOUNTER — Ambulatory Visit (INDEPENDENT_AMBULATORY_CARE_PROVIDER_SITE_OTHER): Payer: Medicare Other | Admitting: Internal Medicine

## 2014-07-25 ENCOUNTER — Encounter: Payer: Self-pay | Admitting: Internal Medicine

## 2014-07-25 VITALS — BP 152/74 | HR 84 | Temp 97.2°F | Wt 102.0 lb

## 2014-07-25 DIAGNOSIS — A310 Pulmonary mycobacterial infection: Secondary | ICD-10-CM | POA: Diagnosis not present

## 2014-07-25 NOTE — Progress Notes (Signed)
Subjective:    Patient ID: Destiny Rodriguez, female    DOB: [birth year], 81 y.o.   MRN: [id number]  HPI 81yo F with history of HLD, chronic sinusitis, allergic sinusitis, bronchiectasis, and chronic pulmonary mac. Doing well. Recently finished course of augmentin for sinus infection that is now resolved. She has some sort of allergic conjunctivitis which she takes antihistamine eyedrops that are quite costly in addition to dulera. Their oop cost for her is $300 per month for these 2 drugs. She states that she feels pretty good other than recovering from sinus infection. Her productive cough is quite minimal. She is able to do much of her usual routine and activities.  Allergies  Allergen Reactions  . Tequin Rash   Current Outpatient Prescriptions on File Prior to Visit  Medication Sig Dispense Refill  . albuterol (PROVENTIL HFA;VENTOLIN HFA) 108 (90 BASE) MCG/ACT inhaler Inhale 2 puffs into the lungs every 4 (four) hours as needed for wheezing or shortness of breath.    . Biotin 1000 MCG tablet Take 1,000 mcg by mouth daily.    . calcium carbonate (OS-CAL) 600 MG TABS Take 600 mg by mouth 2 (two) times daily with a meal.     . chlorpheniramine (CHLOR-TRIMETON) 4 MG tablet Take 4 mg by mouth at bedtime.    Marland Kitchen guaiFENesin (MUCINEX) 600 MG 12 hr tablet Take 2 tablets (1,200 mg total) by mouth 2 (two) times daily. (Patient taking differently: Take 1,200 mg by mouth 2 (two) times daily as needed. )    . MULTIPLE VITAMIN PO Take 1 tablet by mouth daily.     Marland Kitchen amoxicillin-clavulanate (AUGMENTIN) 875-125 MG per tablet Take 1 tablet by mouth 2 (two) times daily. (Patient not taking: Reported on 07/25/2014) 14 tablet 0  . nystatin (MYCOSTATIN) 100000 UNIT/ML suspension Take 4 mLs (400,000 Units total) by mouth 3 (three) times daily. X 5 days swish and swallow (Patient not taking: Reported on 07/25/2014) 60 mL 0   No current facility-administered medications on file prior to visit.   Active Ambulatory  Problems    Diagnosis Date Noted  . Mycobacterium avium-intracellulare complex 06/07/2008  . HYPERLIPIDEMIA 03/25/2007  . SINUSITIS, CHRONIC 06/07/2008  . ALLERGIC RHINITIS 06/07/2008  . Bronchiectasis without acute exacerbation 03/25/2007  . COPD (chronic obstructive pulmonary disease) with chronic bronchitis 06/07/2008  . HIATAL HERNIA 03/25/2007  . Black stool 11/23/2010  . Pneumonia 12/06/2013  . Hemoptysis 12/07/2013   Resolved Ambulatory Problems    Diagnosis Date Noted  . UNSPECIFIED BACTERIAL PNEUMONIA 04/01/2010  . Hemoptysis 10/15/2008  . HEMOPTYSIS UNSPECIFIED 04/01/2010  . Hemoptysis 11/23/2010  . MAC (mycobacterium avium-intracellulare complex) 05/27/2011   Past Medical History  Diagnosis Date  . Allergic rhinitis   . Hyperlipidemia   . Chronic sinusitis   . Bronchiectasis   . Rheumatism      Review of Systems   Constitutional: Negative for fever, chills, diaphoresis, activity change, appetite change, fatigue and unexpected weight change.  HENT: Negative for congestion, sore throat, rhinorrhea, sneezing, trouble swallowing and sinus pressure.  Eyes: Negative for photophobia and visual disturbance.  Respiratory: Negative for cough, chest tightness, shortness of breath, wheezing and stridor.  Cardiovascular: Negative for chest pain, palpitations and leg swelling.  Gastrointestinal: Negative for nausea, vomiting, abdominal pain, diarrhea, constipation, blood in stool, abdominal distention and anal bleeding.  Genitourinary: Negative for dysuria, hematuria, flank pain and difficulty urinating.  Musculoskeletal: Negative for myalgias, back pain, joint swelling, arthralgias and gait problem.  Skin: Negative for color change,  pallor, rash and wound.  Neurological: Negative for dizziness, tremors, weakness and light-headedness.  Hematological: Negative for adenopathy. Does not bruise/bleed easily.  Psychiatric/Behavioral: Negative for behavioral problems, confusion,  sleep disturbance, dysphoric mood, decreased concentration and agitation.       Objective:   Physical Exam BP 152/74 mmHg  Pulse 84  Temp(Src) 97.2 F (36.2 C) (Oral)  Wt 102 lb (46.267 kg) Physical Exam  Constitutional:  oriented to person, place, and time. appears well-developed and well-nourished. No distress.  HENT:  Mouth/Throat: Oropharynx is clear and moist. No oropharyngeal exudate.  Cardiovascular: Normal rate, regular rhythm and normal heart sounds. Exam reveals no gallop and no friction rub.  No murmur heard.  Pulmonary/Chest: Effort normal and breath sounds normal. No respiratory distress.  has no wheezes.  Skin: Skin is warm and dry. No rash noted. No erythema.  Psychiatric: a normal mood and affect.  behavior is normal.      Assessment & Plan:   Pulmonary MAC disease = we will continue with surveillance and conservative management of watching to see if she has exacerbation of pulm symptoms to require initiation of treatment again. She appears to be doing well. No need for reinitiation of treatment at this time.  See back in 6 months, sooner if she has symptoms. Flu vaccine in the Fall

## 2014-08-02 ENCOUNTER — Telehealth: Payer: Self-pay | Admitting: Pulmonary Disease

## 2014-08-02 MED ORDER — MOMETASONE FURO-FORMOTEROL FUM 100-5 MCG/ACT IN AERO: 2.0000 | INHALATION_SPRAY | Freq: Two times a day (BID) | RESPIRATORY_TRACT | Status: DC

## 2014-08-02 NOTE — Telephone Encounter (Signed)
RX has been sent in. Nothing further needed 

## 2014-08-27 ENCOUNTER — Ambulatory Visit (INDEPENDENT_AMBULATORY_CARE_PROVIDER_SITE_OTHER): Payer: Medicare Other | Admitting: Pulmonary Disease

## 2014-08-27 ENCOUNTER — Encounter: Payer: Self-pay | Admitting: Pulmonary Disease

## 2014-08-27 VITALS — BP 122/64 | HR 86 | Temp 97.1°F | Ht 65.0 in | Wt 101.6 lb

## 2014-08-27 DIAGNOSIS — J479 Bronchiectasis, uncomplicated: Secondary | ICD-10-CM

## 2014-08-27 DIAGNOSIS — J449 Chronic obstructive pulmonary disease, unspecified: Secondary | ICD-10-CM

## 2014-08-27 NOTE — Patient Instructions (Signed)
Will change dulera to symbicort 160 2 inhalations each am and pm.  Let us know if this is tolerated well, and we can send in prescription. If your pulmonary symptoms worsen, let us know.  Hopefully this is just an acute viral illness that will resolve quickly. followup with me again in 60mos.

## 2014-08-27 NOTE — Assessment & Plan Note (Signed)
The patient is doing well with dulera, however will need to change to a different inhaler because of insurance. Her samples of Symbicort to try, and she will let us know if doing well so that we can send in a prescription.

## 2014-08-27 NOTE — Assessment & Plan Note (Signed)
The patient has developed symptoms consistent with an acute viral illness, and I suspect this is not influenza. I have asked her to see if it will run its course over the next 48 hours, but the let us know if she begins to cough up purulent mucus that would be more consistent with a bronchiectasis flare.

## 2014-08-27 NOTE — Progress Notes (Signed)
   Subjective:    Patient ID: Destiny Rodriguez, female    DOB: 07/16/1924, 81 y.o.   MRN: [id number]  HPI Patient comes in today for follow-up of her known bronchiectasis with MAC. She is being followed closely by infectious disease, and currently is off medications. She also has some underlying airflow obstruction, and has had a good response to LABA/ICS. However, her insurance is no longer covering [place]. She notes over the last 24 hours mild body ache, temperature to 99 5, and a tickle in her throat. She is not having definite chest congestion, and is not bringing up any mucus. She is concerned this may be the flu, but does not think it is related to her bronchiectasis.   Review of Systems  Constitutional: Negative for fever, chills and unexpected weight change.  HENT: Positive for congestion. Negative for dental problem, ear pain, nosebleeds, postnasal drip, rhinorrhea, sinus pressure, sneezing, sore throat, trouble swallowing and voice change.   Eyes: Negative for visual disturbance.  Respiratory: Positive for cough, chest tightness, shortness of breath and wheezing. Negative for choking.   Cardiovascular: Negative for chest pain and leg swelling.  Gastrointestinal: Negative for vomiting, abdominal pain and diarrhea.  Genitourinary: Negative for difficulty urinating.  Musculoskeletal: Negative for arthralgias.  Skin: Negative for rash.  Neurological: Negative for tremors, syncope and headaches.  Hematological: Does not bruise/bleed easily.       Objective:   Physical Exam Thin female in no acute distress Nose without purulence or discharge noted Oropharynx clear Neck without lymphadenopathy or thyromegaly Chest with clear breath sounds, except for one inspiratory squeak in the right upper lung zone. Cardiac exam with regular rate and rhythm Lower extremities without edema, no cyanosis Alert and oriented, moves all 4 extremities.       Assessment & Plan:

## 2014-08-30 ENCOUNTER — Telehealth: Payer: Self-pay | Admitting: Pulmonary Disease

## 2014-08-30 MED ORDER — LEVOFLOXACIN 750 MG PO TABS: 750.0000 mg | ORAL_TABLET | Freq: Every day | ORAL | Status: DC

## 2014-08-30 NOTE — Telephone Encounter (Signed)
Pt reports she is coughing up white-light pink ting phlem, she is having chest congestion, body aches, and does feel bad. Denies any fever.

## 2014-08-30 NOTE — Telephone Encounter (Signed)
levaquin 750mg  one a day for 7 days

## 2014-08-30 NOTE — Telephone Encounter (Signed)
Is she producing any mucus out of her chest?  Does she feel chest congestion, or does she just have aches and feel poorly.  Any further fever?

## 2014-08-30 NOTE — Telephone Encounter (Signed)
Spoke with pt. States that since she saw [facility] on 08/27/14, her symptoms have gotten worse. Reports increased coughing and runny nose. Mucus is clear. Has been using Mucinex with no relief. Would like KC's recommendations.  [place] - please advise.

## 2014-08-30 NOTE — Telephone Encounter (Signed)
Pt aware of recs. RX sent in. Nothing further needed 

## 2014-09-02 ENCOUNTER — Telehealth: Payer: Self-pay | Admitting: Pulmonary Disease

## 2014-09-02 NOTE — Telephone Encounter (Signed)
Spoke with pt, she is aware of recs.  Nothing further needed at this time.

## 2014-09-02 NOTE — Telephone Encounter (Signed)
Could try tylenol cold and sinus or mucinex cold and sinus.   Will have to run its course.  If she is worsening or not getting better, will need OV.

## 2014-09-02 NOTE — Telephone Encounter (Signed)
Spoke with pt, c/o chest and nasal congestion, mucus is mostly clear X 3 days.  Pt is currently taking levaquin, was given on Friday.  Pt also c/o increased fatigue, weakness.   Pt uses [facility].  Pt is requesting further recs.    [place] please advise.  Thanks!

## 2014-09-09 ENCOUNTER — Telehealth: Payer: Self-pay | Admitting: Pulmonary Disease

## 2014-09-09 NOTE — Telephone Encounter (Signed)
If mucus has cleared and is still having post nasal drip, likely due to allergies.  She can take the chlorpheniramine every 6 hrs.  See if that will help her drip, and therefore her cough.  Let us know if not better.

## 2014-09-09 NOTE — Telephone Encounter (Signed)
Spoke with pt. States that she finished Levaquin on Friday. She is still having lots of nasal drainage and her cough has returned. Cough is producing mucus but it is clear. Would like KC's recommendations.  [place] - please advise. Thanks.

## 2014-09-09 NOTE — Telephone Encounter (Signed)
Pt aware of rec's per [facility] Will call if not any better in a few days.  Nothing further needed.

## 2014-10-29 ENCOUNTER — Telehealth: Payer: Self-pay | Admitting: Pulmonary Disease

## 2014-10-29 MED ORDER — BUDESONIDE-FORMOTEROL FUMARATE 160-4.5 MCG/ACT IN AERO: 2.0000 | INHALATION_SPRAY | Freq: Two times a day (BID) | RESPIRATORY_TRACT | Status: DC

## 2014-10-29 NOTE — Telephone Encounter (Signed)
Called and spoke to pt. Pt requesting rx of symbicort sent to pharmacy. Rx sent to preferred pharmacy. Pt verbalized understanding and denied any further questions or concerns at this time.

## 2014-10-31 ENCOUNTER — Encounter: Payer: Self-pay | Admitting: Internal Medicine

## 2014-10-31 ENCOUNTER — Ambulatory Visit (INDEPENDENT_AMBULATORY_CARE_PROVIDER_SITE_OTHER): Payer: Medicare Other | Admitting: Internal Medicine

## 2014-10-31 VITALS — BP 166/83 | HR 85 | Temp 98.1°F | Wt 100.0 lb

## 2014-10-31 DIAGNOSIS — A310 Pulmonary mycobacterial infection: Secondary | ICD-10-CM | POA: Diagnosis not present

## 2014-10-31 DIAGNOSIS — J479 Bronchiectasis, uncomplicated: Secondary | ICD-10-CM

## 2014-10-31 NOTE — Progress Notes (Signed)
Subjective:    Patient ID: Destiny Rodriguez, female    DOB: 12/11/1924, 81 y.o.   MRN: [id number]  HPI Destiny Rodriguez is an 81 y/o female who is here for routine follow up for bronchiectasis and MAC. She states she has felt great over the past month. She denies any hemoptysis, cough, fevers, chills in the last three to four weeks. She states she has not needed her inhalers as much this month and uses her albuterol maybe once/month. She did have some trouble with URI and sinusitis in April   Outpatient Encounter Prescriptions as of 10/31/2014  Medication Sig  . albuterol (PROVENTIL HFA;VENTOLIN HFA) 108 (90 BASE) MCG/ACT inhaler Inhale 2 puffs into the lungs every 4 (four) hours as needed for wheezing or shortness of breath.  . Biotin 1000 MCG tablet Take 1,000 mcg by mouth daily.  . budesonide-formoterol (SYMBICORT) 160-4.5 MCG/ACT inhaler Inhale 2 puffs into the lungs 2 (two) times daily.  . calcium carbonate (OS-CAL) 600 MG TABS Take 600 mg by mouth 2 (two) times daily with a meal.   . guaiFENesin (MUCINEX) 600 MG 12 hr tablet Take 2 tablets (1,200 mg total) by mouth 2 (two) times daily. (Patient taking differently: Take 1,200 mg by mouth 2 (two) times daily as needed. )  . MULTIPLE VITAMIN PO Take 1 tablet by mouth daily.   . chlorpheniramine (CHLOR-TRIMETON) 4 MG tablet Take 4 mg by mouth at bedtime.  Marland Kitchen levofloxacin (LEVAQUIN) 750 MG tablet Take 1 tablet (750 mg total) by mouth daily. (Patient not taking: Reported on 10/31/2014)   No facility-administered encounter medications on file as of 10/31/2014.    Review of Systems  Constitutional: Negative for fever, chills, fatigue and unexpected weight change.  HENT: Negative for rhinorrhea, sinus pressure, sore throat and trouble swallowing.   Eyes: Negative for visual disturbance.  Respiratory: Positive for shortness of breath. Negative for cough, chest tightness and wheezing.        Dyspnea with exertion, has improved  Cardiovascular: Negative  for chest pain and leg swelling.  Gastrointestinal: Negative for nausea, abdominal pain, diarrhea, constipation, blood in stool and abdominal distention.  Genitourinary: Negative for difficulty urinating.  Musculoskeletal: Negative for myalgias and arthralgias.  Skin: Negative for pallor and rash.  Neurological: Negative for dizziness, seizures and speech difficulty.  Hematological: Negative for adenopathy.  Psychiatric/Behavioral: Negative for behavioral problems and sleep disturbance. The patient is not nervous/anxious.        Objective:   Physical Exam  Constitutional: She is oriented to person, place, and time. She appears well-developed and well-nourished.  HENT:  Head: Normocephalic and atraumatic.  Mouth/Throat: No oropharyngeal exudate.  Eyes: Conjunctivae are normal. Pupils are equal, round, and reactive to light.  Neck: Normal range of motion. Neck supple.  Cardiovascular: Normal rate and regular rhythm.   Pulmonary/Chest: Effort normal.  Fine crackles noted in bilateral lower bases.   Abdominal: Soft. Bowel sounds are normal.  Musculoskeletal: Normal range of motion.  Lymphadenopathy:    She has no cervical adenopathy.  Neurological: She is alert and oriented to person, place, and time.  Skin: Skin is warm and dry.  Psychiatric: She has a normal mood and affect. Her behavior is normal.   Blood pressure 166/83, pulse 85, temperature 98.1 F (36.7 C), weight 100 lb (45.36 kg).        Assessment & Plan:  Assessment: Destiny Rodriguez has been feeling great for the past several weeks after a bout of URI's in April. No current  active signs of MAC. Plan: MAC:   Has been asymptomatic of recurrent MAC. Will continue to monitor. Call if experiencing recurrent cough, fevers, chills, or shortness of breath. RTC in 6 months.

## 2014-12-24 ENCOUNTER — Ambulatory Visit (INDEPENDENT_AMBULATORY_CARE_PROVIDER_SITE_OTHER): Payer: Medicare Other | Admitting: Pulmonary Disease

## 2014-12-24 ENCOUNTER — Encounter: Payer: Self-pay | Admitting: Pulmonary Disease

## 2014-12-24 VITALS — BP 114/64 | HR 101 | Ht 65.0 in | Wt 98.8 lb

## 2014-12-24 DIAGNOSIS — J449 Chronic obstructive pulmonary disease, unspecified: Secondary | ICD-10-CM | POA: Diagnosis not present

## 2014-12-24 DIAGNOSIS — A310 Pulmonary mycobacterial infection: Secondary | ICD-10-CM

## 2014-12-24 MED ORDER — ALBUTEROL SULFATE (2.5 MG/3ML) 0.083% IN NEBU: 2.5000 mg | INHALATION_SOLUTION | Freq: Four times a day (QID) | RESPIRATORY_TRACT | Status: DC | PRN

## 2014-12-24 NOTE — Progress Notes (Signed)
Subjective:    Patient ID: Destiny Rodriguez, female    DOB: 09/05/1924, 81 y.o.   MRN: [id number]  Synopsis: Former Patient of Dr. Gwenette Greet with COPD and MAC  Rx with clarithro/ethamb 2000-2001 Started clarithro/ethamb/rif 08/2010 CT chest 04/2011: large cavitary disease superimposed over bronchiectasis.  Started on MAC meds per ID 05/2011 CT chest 12/20/12 >Bronchiectasis, pulmonary parenchymal cysts,   A collapsed cyst in the left upper lobe with associated presumed pulmonary parenchymal scarring   finished 17mos of treatment ending May 2014.   Uses therapy vest twice a day.  Spirometry 2010: FEV1 1.26 (71%), ratio 65. Good response to LABA/ICS  HPI Chief Complaint  Patient presents with  . Follow-up    former [place] pt- was treated for COPD and MAIC.  pt c/o prod cough with clear-yellow mucus X2 weeks.  Also notes R sided chest discomfort.     Destiny Rodriguez was diagnosed with MAC about 16 years ago when she was living in [place].  She had repeated episodes of pneumonia and was diagnosed with MAC.  She was treated for MAC for a year in 2001 by Dr. Alva Garnet.  She had hemoptysis at times for that spell.  After a year of treatment she says she "went into remission" but then she started having problems with MAC in late 2012 and she was started on triple drug therapy then and continued on this treatment for 16 months.  Treatment was held early due to severe leg cramps.  Her respiratory symptoms improved transiently but after stopping therapy she started having respiratory symptoms again (cough, hemoptysis).  She was hospitalized in 2015 and has been well since then for the most part.  She had an illness with a virus in April of this year which she says took "a long time" to get over.  For the last few weeks she has been coughing more and has been producing more mucus lately which is brown in color (darker than normal), but non-bloody.  She has not been taking mucinex daliy..    Past Medical History    Diagnosis Date  . Allergic rhinitis   . Hyperlipidemia   . Chronic sinusitis   . Bronchiectasis   . Rheumatism       Review of Systems     Objective:   Physical Exam Filed Vitals:   12/24/14 1329  BP: 114/64  Pulse: 101  Height: 5\' 5"  (1.651 m)  Weight: 98 lb 12.8 oz (44.815 kg)  SpO2: 95%    Gen: well appearing HENT: OP clear, TM's clear, neck supple PULM: Crackles left base, otherwise clear CV: RRR, no mgr, trace edema GI: BS+, soft, nontender Derm: no cyanosis or rash Psyche: normal mood and affect  CT images from 2014 reviewed. > significant bronchiectasis in lower lobes, cavitary lesions upper lobes Notes from pulmonary prior reviewed     Assessment & Plan:  Mycobacterium avium-intracellulare complex She has significant bronchiectasis and has been treated twice in the last 15 years for her Warner Mccreedy. She is currently having a mild exacerbation of bronchiectasis but it's not clear to me that this is directly related to the atypical mycobacterial disease.  I'm going to treat this as an exacerbation of bronchiectasis while sampling her mucus. Rather than treating with antibiotics at this time I'm going to have her increase her mucociliary clearance measures is much as possible. If she has not had improvement in a week then we can consider antibiotics therapy.  Plan: Increase therapy vest used to  3 times a day, of asked her to use postural drainage techniques Continue Mucinex twice a day At albuterol twice a day with nebulizer Collect sputum for AFB, bacterial, and fungal culture Follow-up 3 months  COPD (chronic obstructive pulmonary disease) with chronic bronchitis I suspect this is mostly due to bronchiectasis. She is not wheezing on exam today.  Plan: Continue Symbicort  Greater than 25 minutes spent in direct consultation with patient today.     Current outpatient prescriptions:  .  Biotin 1000 MCG tablet, Take 1,000 mcg by mouth daily., Disp: , Rfl:   .  budesonide-formoterol (SYMBICORT) 160-4.5 MCG/ACT inhaler, Inhale 2 puffs into the lungs 2 (two) times daily., Disp: 1 Inhaler, Rfl: 6 .  calcium carbonate (OS-CAL) 600 MG TABS, Take 600 mg by mouth 2 (two) times daily with a meal. , Disp: , Rfl:  .  chlorpheniramine (CHLOR-TRIMETON) 4 MG tablet, Take 4 mg by mouth at bedtime., Disp: , Rfl:  .  guaiFENesin (MUCINEX) 600 MG 12 hr tablet, Take 2 tablets (1,200 mg total) by mouth 2 (two) times daily. (Patient taking differently: Take 1,200 mg by mouth 2 (two) times daily as needed. ), Disp: , Rfl:  .  MULTIPLE VITAMIN PO, Take 1 tablet by mouth daily. , Disp: , Rfl:  .  albuterol (PROVENTIL HFA;VENTOLIN HFA) 108 (90 BASE) MCG/ACT inhaler, Inhale 2 puffs into the lungs every 4 (four) hours as needed for wheezing or shortness of breath., Disp: , Rfl:

## 2014-12-24 NOTE — Assessment & Plan Note (Signed)
She has significant bronchiectasis and has been treated twice in the last 15 years for her Warner Mccreedy. She is currently having a mild exacerbation of bronchiectasis but it's not clear to me that this is directly related to the atypical mycobacterial disease.  I'm going to treat this as an exacerbation of bronchiectasis while sampling her mucus. Rather than treating with antibiotics at this time I'm going to have her increase her mucociliary clearance measures is much as possible. If she has not had improvement in a week then we can consider antibiotics therapy.  Plan: Increase therapy vest used to 3 times a day, of asked her to use postural drainage techniques Continue Mucinex twice a day At albuterol twice a day with nebulizer Collect sputum for AFB, bacterial, and fungal culture Follow-up 3 months

## 2014-12-24 NOTE — Patient Instructions (Signed)
Use the therapy vest 3 times a day for the next week. In your morning sessions lay on your right side Use Mucinex 2 pills twice a day Use albuterol nebulizer twice a day with a therapy vest for the next 2 weeks then after that use as needed Continue using your Symbicort Give [place] a sample of your mucus Call us in a week if you are not feeling better We will see you back in 3 months or sooner if needed

## 2014-12-24 NOTE — Assessment & Plan Note (Signed)
I suspect this is mostly due to bronchiectasis. She is not wheezing on exam today.  Plan: Continue Symbicort  Greater than 25 minutes spent in direct consultation with patient today.

## 2014-12-25 ENCOUNTER — Telehealth: Payer: Self-pay | Admitting: Pulmonary Disease

## 2014-12-25 NOTE — Telephone Encounter (Signed)
Spoke with Dianne at [facility], states that albuterol rx needs to be sent without prn in the order.   Verbally changed.  Nothing further needed at this time.

## 2014-12-25 NOTE — Telephone Encounter (Signed)
Spoke with Destiny Rodriguez at [facility]. States she needs copy signed OV notes faxed to get pt's medication out to their home. Notes faxed to [phone]. Nothing further needed.

## 2014-12-27 ENCOUNTER — Other Ambulatory Visit: Payer: Medicare Other

## 2014-12-27 ENCOUNTER — Other Ambulatory Visit: Payer: Self-pay | Admitting: Pulmonary Disease

## 2014-12-27 DIAGNOSIS — A310 Pulmonary mycobacterial infection: Secondary | ICD-10-CM | POA: Diagnosis not present

## 2014-12-31 LAB — RESPIRATORY CULTURE OR RESPIRATORY AND SPUTUM CULTURE

## 2015-01-01 ENCOUNTER — Other Ambulatory Visit: Payer: Self-pay

## 2015-01-01 MED ORDER — ALBUTEROL SULFATE HFA 108 (90 BASE) MCG/ACT IN AERS: 2.0000 | INHALATION_SPRAY | RESPIRATORY_TRACT | Status: DC | PRN

## 2015-01-08 ENCOUNTER — Telehealth: Payer: Self-pay

## 2015-01-08 DIAGNOSIS — J449 Chronic obstructive pulmonary disease, unspecified: Secondary | ICD-10-CM

## 2015-01-08 NOTE — Telephone Encounter (Signed)
-----   Message from Juanito Doom, MD sent at 01/07/2015  2:05 PM EDT ----- A Her ONO showed significant time < 88%, she needs 2L qHS and repeat ONO. Thanks B

## 2015-01-08 NOTE — Telephone Encounter (Signed)
Spoke with pt, aware of results and recs.  Order placed.  Nothing further needed at this time.

## 2015-01-10 ENCOUNTER — Telehealth: Payer: Self-pay | Admitting: Pulmonary Disease

## 2015-01-10 DIAGNOSIS — J449 Chronic obstructive pulmonary disease, unspecified: Secondary | ICD-10-CM | POA: Diagnosis not present

## 2015-01-10 NOTE — Telephone Encounter (Signed)
Please make sure she has a follow up appointment with me

## 2015-01-10 NOTE — Telephone Encounter (Signed)
Critical lab call Received phone call from [place] with [facility] Was informed of sputum spec that is positive for micobacteria & intracellular complex Requested lab report be faxed to office for further review  Dr Lake Bells, please advise of rec. Thanks

## 2015-01-10 NOTE — Telephone Encounter (Signed)
Patient scheduled for follow up Patient notified.  Nothing further needed.

## 2015-01-13 ENCOUNTER — Ambulatory Visit: Payer: BLUE CROSS/BLUE SHIELD | Admitting: Internal Medicine

## 2015-01-15 ENCOUNTER — Telehealth: Payer: Self-pay

## 2015-01-15 ENCOUNTER — Other Ambulatory Visit: Payer: Self-pay

## 2015-01-15 DIAGNOSIS — J449 Chronic obstructive pulmonary disease, unspecified: Secondary | ICD-10-CM

## 2015-01-15 NOTE — Telephone Encounter (Signed)
02 ordered per BQ.  Nothing further needed.

## 2015-01-15 NOTE — Telephone Encounter (Signed)
-----   Message from Juanito Doom, MD sent at 01/15/2015  1:14 PM EDT ----- Can we order 2L O2 qHS? thanks ----- Message -----    From: Joellen Jersey    Sent: 01/13/2015   2:16 PM      To: Juanito Doom, MD, [place] an order was placed for ono and then for ono on 02 2lpm but there was never an order for the 2lpm of 02 i need this order placed thanks libby

## 2015-01-20 ENCOUNTER — Encounter: Payer: Self-pay | Admitting: Pulmonary Disease

## 2015-01-20 ENCOUNTER — Ambulatory Visit (INDEPENDENT_AMBULATORY_CARE_PROVIDER_SITE_OTHER): Payer: Medicare Other | Admitting: Pulmonary Disease

## 2015-01-20 VITALS — BP 122/62 | HR 69 | Ht 64.5 in | Wt 98.6 lb

## 2015-01-20 DIAGNOSIS — J471 Bronchiectasis with (acute) exacerbation: Secondary | ICD-10-CM | POA: Diagnosis not present

## 2015-01-20 DIAGNOSIS — A310 Pulmonary mycobacterial infection: Secondary | ICD-10-CM

## 2015-01-20 DIAGNOSIS — Z1231 Encounter for screening mammogram for malignant neoplasm of breast: Secondary | ICD-10-CM | POA: Diagnosis not present

## 2015-01-20 DIAGNOSIS — J449 Chronic obstructive pulmonary disease, unspecified: Secondary | ICD-10-CM

## 2015-01-20 DIAGNOSIS — J4489 Other specified chronic obstructive pulmonary disease: Secondary | ICD-10-CM

## 2015-01-20 MED ORDER — LEVOFLOXACIN 750 MG PO TABS: 750.0000 mg | ORAL_TABLET | Freq: Every day | ORAL | Status: DC

## 2015-01-20 NOTE — Assessment & Plan Note (Signed)
She was recently discovered to have nocturnal hypoxemia. Continue O2 at night, we will add a humidifier. Continue Symbicort

## 2015-01-20 NOTE — Assessment & Plan Note (Signed)
She is having a flare of her bronchiectasis right now as she has some mild hemoptysis. She also is having increasing shortness of breath and sputum burden.  Plan: Use therapy vest twice a day Levaquin for 14 days with a probiotic Follow-up one month to discuss state of symptoms and consider restarting MAI therapy

## 2015-01-20 NOTE — Progress Notes (Signed)
Subjective:    Patient ID: Destiny Rodriguez, female    DOB: 12-01-1924, 81 y.o.   MRN: [id number]  Synopsis: Former Patient of Dr. Gwenette Greet with COPD and MAC  Rx with clarithro/ethamb 2000-2001 Started clarithro/ethamb/rif 08/2010 CT chest 04/2011: large cavitary disease superimposed over bronchiectasis.  Started on MAC meds per ID 05/2011 CT chest 12/20/12 >Bronchiectasis, pulmonary parenchymal cysts,   A collapsed cyst in the left upper lobe with associated presumed pulmonary parenchymal scarring   finished 34mos of treatment ending May 2014.   Uses therapy vest twice a day.  Spirometry 2010: FEV1 1.26 (71%), ratio 65. Good response to LABA/ICS  HPI Chief Complaint  Patient presents with  . Follow-up    Breathing is fair. She is now using O2 at bedtime. Slight wheezing/chest tx, prod cough (clear-medium/dark red phlem) happens a few times a day. She is using her vest BID x 30 min.    She is not feeling much better. She is having some mild hemoptysis. Her mucus production is increased compared to her baseline. She is using the therapy vest twice a day because using it 3 times a day made her chest hurt She is having mild increased shortness of breath. There is no fever or chills  Past Medical History  Diagnosis Date  . Allergic rhinitis   . Hyperlipidemia   . Chronic sinusitis   . Bronchiectasis   . Rheumatism       Review of Systems     Objective:   Physical Exam Filed Vitals:   01/20/15 1203  BP: 122/62  Pulse: 69  Height: 5' 4.5" (1.638 m)  Weight: 98 lb 9.6 oz (44.725 kg)  SpO2: 94%    Gen: well appearing HENT: OP clear, TM's clear, neck supple PULM: Crackles both bases bilaterally CV: RRR, no mgr, trace edema GI: BS+, soft, nontender Derm: no cyanosis or rash Psyche: normal mood and affect  CT images from 2014 reviewed. > significant bronchiectasis in lower lobes, cavitary lesions upper lobes Notes from pulmonary prior reviewed     Assessment &  Plan:  Mycobacterium avium-intracellulare complex She grew out MAI again last month. She is currently having a flare for bronchiectasis which we will treat as detailed below. If she has not recovered then we will need to consider restarting treatment for MAI.  Bronchiectasis without acute exacerbation She is having a flare of her bronchiectasis right now as she has some mild hemoptysis. She also is having increasing shortness of breath and sputum burden.  Plan: Use therapy vest twice a day Levaquin for 14 days with a probiotic Follow-up one month to discuss state of symptoms and consider restarting MAI therapy  COPD (chronic obstructive pulmonary disease) with chronic bronchitis She was recently discovered to have nocturnal hypoxemia. Continue O2 at night, we will add a humidifier. Continue Symbicort     Current outpatient prescriptions:  .  albuterol (PROVENTIL HFA;VENTOLIN HFA) 108 (90 BASE) MCG/ACT inhaler, Inhale 2 puffs into the lungs every 4 (four) hours as needed for wheezing or shortness of breath., Disp: 1 Inhaler, Rfl: 3 .  albuterol (PROVENTIL) (2.5 MG/3ML) 0.083% nebulizer solution, Take 3 mLs (2.5 mg total) by nebulization every 6 (six) hours as needed for wheezing or shortness of breath., Disp: 360 mL, Rfl: 11 .  Biotin 1000 MCG tablet, Take 1,000 mcg by mouth daily., Disp: , Rfl:  .  budesonide-formoterol (SYMBICORT) 160-4.5 MCG/ACT inhaler, Inhale 2 puffs into the lungs 2 (two) times daily., Disp: 1 Inhaler, Rfl: 6 .  calcium carbonate (OS-CAL) 600 MG TABS, Take 600 mg by mouth 2 (two) times daily with a meal. , Disp: , Rfl:  .  chlorpheniramine (CHLOR-TRIMETON) 4 MG tablet, Take 4 mg by mouth at bedtime., Disp: , Rfl:  .  guaiFENesin (MUCINEX) 600 MG 12 hr tablet, Take 2 tablets (1,200 mg total) by mouth 2 (two) times daily. (Patient taking differently: Take 1,200 mg by mouth 2 (two) times daily as needed. ), Disp: , Rfl:  .  MULTIPLE VITAMIN PO, Take 1 tablet by mouth  daily. , Disp: , Rfl:  .  levofloxacin (LEVAQUIN) 750 MG tablet, Take 1 tablet (750 mg total) by mouth daily., Disp: 14 tablet, Rfl: 0

## 2015-01-20 NOTE — Assessment & Plan Note (Signed)
She grew out MAI again last month. She is currently having a flare for bronchiectasis which we will treat as detailed below. If she has not recovered then we will need to consider restarting treatment for MAI.

## 2015-01-20 NOTE — Patient Instructions (Signed)
Take Levaquin every day for 2 weeks Usual therapy vest twice a day See me in one month

## 2015-01-24 LAB — FUNGUS CULTURE W SMEAR: Smear Result: NONE SEEN

## 2015-01-31 ENCOUNTER — Encounter: Payer: Self-pay | Admitting: Pulmonary Disease

## 2015-02-15 DIAGNOSIS — Z23 Encounter for immunization: Secondary | ICD-10-CM | POA: Diagnosis not present

## 2015-02-19 ENCOUNTER — Encounter: Payer: Self-pay | Admitting: Pulmonary Disease

## 2015-02-19 ENCOUNTER — Ambulatory Visit (INDEPENDENT_AMBULATORY_CARE_PROVIDER_SITE_OTHER): Payer: Medicare Other | Admitting: Pulmonary Disease

## 2015-02-19 VITALS — BP 110/60 | HR 87 | Ht 64.0 in | Wt 99.0 lb

## 2015-02-19 DIAGNOSIS — J471 Bronchiectasis with (acute) exacerbation: Secondary | ICD-10-CM

## 2015-02-19 MED ORDER — BUDESONIDE-FORMOTEROL FUMARATE 80-4.5 MCG/ACT IN AERO: 2.0000 | INHALATION_SPRAY | Freq: Two times a day (BID) | RESPIRATORY_TRACT | Status: DC

## 2015-02-19 NOTE — Assessment & Plan Note (Signed)
This has been a stable interval for Destiny Rodriguez. Her symptoms have improved dramatically despite the recent growth of Mycobacterium avium intracellular.  Plan: Because of the improvement in symptoms I'm going to continue to watch without treatment. I do want to see her frequently to make sure she has not had progression of symptoms because we may need to repeat treatment in the future for her mycobacterial disease I'm going to lower her Symbicort to 80 mg dosing to minimize mouth sores Continue mucociliary clearance measures Flu shot up-to-date Follow-up 3 months

## 2015-02-19 NOTE — Progress Notes (Signed)
Subjective:    Patient ID: Destiny Rodriguez, female    DOB: [birth year], 81 y.o.   MRN: [id number]  Synopsis: Former Patient of Dr. Gwenette Greet with COPD and MAC  Rx with clarithro/ethamb 2000-2001 Started clarithro/ethamb/rif 08/2010 CT chest 04/2011: large cavitary disease superimposed over bronchiectasis.  Started on MAC meds per ID 05/2011 CT chest 12/20/12 >Bronchiectasis, pulmonary parenchymal cysts,   A collapsed cyst in the left upper lobe with associated presumed pulmonary parenchymal scarring   finished 71mos of treatment ending May 2014.   Uses therapy vest twice a day.  Spirometry 2010: FEV1 1.26 (71%), ratio 65. Good response to LABA/ICS  HPI Chief Complaint  Patient presents with  . Follow-up    Breathing unchanged since last visit. Pt states ongoing problem with mouth sores due to inhaler use    [name] is doing better. She says that her breathing has improved. Seh notes some ankle swelling which she attributes to the antibiotic she took earlier in the month. She is not anxious to take anything for it. Her breathing has improved.   She is sleeping better with oxygen. She has been using the humidifier for the oxygen which helps a lot. She is using symbicort twice a day.   Past Medical History  Diagnosis Date  . Allergic rhinitis   . Hyperlipidemia   . Chronic sinusitis   . Bronchiectasis   . Rheumatism       Review of Systems     Objective:   Physical Exam Filed Vitals:   02/19/15 1552  BP: 110/60  Pulse: 87  Height: 5\' 4"  (1.626 m)  Weight: 99 lb (44.906 kg)  SpO2: 95%    Gen: well appearing HENT: OP clear, TM's clear, neck supple PULM: Crackles both bases bilaterally CV: RRR, no mgr, trace edema GI: BS+, soft, nontender Derm: no cyanosis or rash Psyche: normal mood and affect       Assessment & Plan:  Bronchiectasis without acute exacerbation This has been a stable interval for Ashby. Her symptoms have improved dramatically despite  the recent growth of Mycobacterium avium intracellular.  Plan: Because of the improvement in symptoms I'm going to continue to watch without treatment. I do want to see her frequently to make sure she has not had progression of symptoms because we may need to repeat treatment in the future for her mycobacterial disease I'm going to lower her Symbicort to 80 mg dosing to minimize mouth sores Continue mucociliary clearance measures Flu shot up-to-date Follow-up 3 months     Current outpatient prescriptions:  .  albuterol (PROVENTIL HFA;VENTOLIN HFA) 108 (90 BASE) MCG/ACT inhaler, Inhale 2 puffs into the lungs every 4 (four) hours as needed for wheezing or shortness of breath., Disp: 1 Inhaler, Rfl: 3 .  albuterol (PROVENTIL) (2.5 MG/3ML) 0.083% nebulizer solution, Take 3 mLs (2.5 mg total) by nebulization every 6 (six) hours as needed for wheezing or shortness of breath., Disp: 360 mL, Rfl: 11 .  Biotin 1000 MCG tablet, Take 1,000 mcg by mouth daily., Disp: , Rfl:  .  calcium carbonate (OS-CAL) 600 MG TABS, Take 600 mg by mouth 2 (two) times daily with a meal. , Disp: , Rfl:  .  chlorpheniramine (CHLOR-TRIMETON) 4 MG tablet, Take 4 mg by mouth at bedtime., Disp: , Rfl:  .  guaiFENesin (MUCINEX) 600 MG 12 hr tablet, Take 2 tablets (1,200 mg total) by mouth 2 (two) times daily. (Patient taking differently: Take 1,200 mg by mouth 2 (two) times daily as  needed. ), Disp: , Rfl:  .  MULTIPLE VITAMIN PO, Take 1 tablet by mouth daily. , Disp: , Rfl:  .  budesonide-formoterol (SYMBICORT) 80-4.5 MCG/ACT inhaler, Inhale 2 puffs into the lungs 2 (two) times daily., Disp: 1 Inhaler, Rfl: 12 .  levofloxacin (LEVAQUIN) 750 MG tablet, Take 1 tablet (750 mg total) by mouth daily. (Patient not taking: Reported on 02/19/2015), Disp: 14 tablet, Rfl: 0

## 2015-02-19 NOTE — Patient Instructions (Signed)
Take the lower dose of Symbicort that I have prescribed, 2 puffs twice a day Keep using your vest as you are doing Keep using oxygen at night We will see you back in 3 months or sooner if needed

## 2015-02-20 LAB — AFB CULTURE WITH SMEAR (NOT AT ARMC): Acid Fast Smear: NONE SEEN

## 2015-03-06 ENCOUNTER — Ambulatory Visit (INDEPENDENT_AMBULATORY_CARE_PROVIDER_SITE_OTHER): Payer: Medicare Other | Admitting: Internal Medicine

## 2015-03-06 ENCOUNTER — Encounter: Payer: Self-pay | Admitting: Internal Medicine

## 2015-03-06 VITALS — BP 181/81 | HR 91 | Wt 98.0 lb

## 2015-03-06 DIAGNOSIS — R636 Underweight: Secondary | ICD-10-CM | POA: Diagnosis not present

## 2015-03-06 DIAGNOSIS — A310 Pulmonary mycobacterial infection: Secondary | ICD-10-CM | POA: Diagnosis not present

## 2015-03-06 DIAGNOSIS — J479 Bronchiectasis, uncomplicated: Secondary | ICD-10-CM | POA: Diagnosis present

## 2015-03-10 NOTE — Progress Notes (Signed)
RFV: pulm MAC Subjective:    Patient ID: Destiny Rodriguez, female    DOB: 1924/06/04, 81 y.o.   MRN: [id number]  HPI 81yo F with hx of broncheictasis without acute exacerbation, hx of tx of pulm MAC finished in April 2014, who has been doing very well. Last exacerbation in late August. She is now seeing Dr. Lake Bells for her follow up on bronchiectasis. She still had lingering productive cough and was treated with 2 wk course of levofloxacin. She is not feeling better. No longer having productive cough or shortness of breath. No diarrhea.  Allergies  Allergen Reactions  . Tequin Rash   Current Outpatient Prescriptions on File Prior to Visit  Medication Sig Dispense Refill  . albuterol (PROVENTIL HFA;VENTOLIN HFA) 108 (90 BASE) MCG/ACT inhaler Inhale 2 puffs into the lungs every 4 (four) hours as needed for wheezing or shortness of breath. 1 Inhaler 3  . albuterol (PROVENTIL) (2.5 MG/3ML) 0.083% nebulizer solution Take 3 mLs (2.5 mg total) by nebulization every 6 (six) hours as needed for wheezing or shortness of breath. 360 mL 11  . Biotin 1000 MCG tablet Take 1,000 mcg by mouth daily.    . budesonide-formoterol (SYMBICORT) 80-4.5 MCG/ACT inhaler Inhale 2 puffs into the lungs 2 (two) times daily. 1 Inhaler 12  . calcium carbonate (OS-CAL) 600 MG TABS Take 600 mg by mouth 2 (two) times daily with a meal.     . chlorpheniramine (CHLOR-TRIMETON) 4 MG tablet Take 4 mg by mouth at bedtime.    Marland Kitchen guaiFENesin (MUCINEX) 600 MG 12 hr tablet Take 2 tablets (1,200 mg total) by mouth 2 (two) times daily. (Patient taking differently: Take 1,200 mg by mouth 2 (two) times daily as needed. )    . MULTIPLE VITAMIN PO Take 1 tablet by mouth daily.     Marland Kitchen levofloxacin (LEVAQUIN) 750 MG tablet Take 1 tablet (750 mg total) by mouth daily. (Patient not taking: Reported on 02/19/2015) 14 tablet 0   No current facility-administered medications on file prior to visit.   Active Ambulatory Problems    Diagnosis Date  Noted  . Mycobacterium avium-intracellulare complex ([place]) 06/07/2008  . HYPERLIPIDEMIA 03/25/2007  . SINUSITIS, CHRONIC 06/07/2008  . ALLERGIC RHINITIS 06/07/2008  . Bronchiectasis without acute exacerbation ([place]) 03/25/2007  . COPD (chronic obstructive pulmonary disease) with chronic bronchitis ([place]) 06/07/2008  . HIATAL HERNIA 03/25/2007  . Black stool 11/23/2010  . Pneumonia 12/06/2013  . Hemoptysis 12/07/2013   Resolved Ambulatory Problems    Diagnosis Date Noted  . UNSPECIFIED BACTERIAL PNEUMONIA 04/01/2010  . Hemoptysis 10/15/2008  . HEMOPTYSIS UNSPECIFIED 04/01/2010  . Hemoptysis 11/23/2010  . MAC (mycobacterium avium-intracellulare complex) 05/27/2011   Past Medical History  Diagnosis Date  . Allergic rhinitis   . Hyperlipidemia   . Chronic sinusitis   . Bronchiectasis   . Rheumatism       Review of Systems A comprehensive review of systems has been completed. + weight loss, 98LB down by 2 lb since last visit    Objective:   Physical Exam BP 181/81 mmHg  Pulse 91  Wt 98 lb (44.453 kg) Physical Exam  Constitutional:  oriented to person, place, and time. appears well-developed and well-nourished. No distress.  HENT: [place]/AT, PERRLA, no scleral icterus Mouth/Throat: Oropharynx is clear and moist. No oropharyngeal exudate.  Cardiovascular: Normal rate, regular rhythm and normal heart sounds. Exam reveals no gallop and no friction rub.  No murmur heard.  Pulmonary/Chest: Effort normal and breath sounds normal. No respiratory distress.  has no wheezes.  Neck = supple, no nuchal rigidity Lymphadenopathy: no cervical adenopathy. No axillary adenopathy Neurological: alert and oriented to person, place, and time.  Skin: Skin is warm and dry. No rash noted. No erythema.  Psychiatric: a normal mood and affect.  behavior is normal.       Assessment & Plan:  Bronchiectasis without exacerbation = doing well presently. Continue with her daily regimen  pulm MAC = no need  for treatment at this time  Underwent weight = encouraged more protein intake

## 2015-03-16 ENCOUNTER — Encounter: Payer: Self-pay | Admitting: Pulmonary Disease

## 2015-03-17 DIAGNOSIS — A310 Pulmonary mycobacterial infection: Secondary | ICD-10-CM | POA: Diagnosis not present

## 2015-03-17 DIAGNOSIS — E7800 Pure hypercholesterolemia, unspecified: Secondary | ICD-10-CM | POA: Diagnosis not present

## 2015-03-17 DIAGNOSIS — F3342 Major depressive disorder, recurrent, in full remission: Secondary | ICD-10-CM | POA: Diagnosis not present

## 2015-03-17 DIAGNOSIS — Z79899 Other long term (current) drug therapy: Secondary | ICD-10-CM | POA: Diagnosis not present

## 2015-03-17 DIAGNOSIS — Z0000 Encounter for general adult medical examination without abnormal findings: Secondary | ICD-10-CM | POA: Diagnosis not present

## 2015-03-17 DIAGNOSIS — M810 Age-related osteoporosis without current pathological fracture: Secondary | ICD-10-CM | POA: Diagnosis not present

## 2015-03-17 DIAGNOSIS — E46 Unspecified protein-calorie malnutrition: Secondary | ICD-10-CM | POA: Diagnosis not present

## 2015-05-23 ENCOUNTER — Ambulatory Visit (INDEPENDENT_AMBULATORY_CARE_PROVIDER_SITE_OTHER): Payer: Medicare Other | Admitting: Pulmonary Disease

## 2015-05-23 ENCOUNTER — Encounter: Payer: Self-pay | Admitting: Pulmonary Disease

## 2015-05-23 VITALS — BP 124/72 | HR 83 | Ht 64.0 in | Wt 100.6 lb

## 2015-05-23 DIAGNOSIS — A310 Pulmonary mycobacterial infection: Secondary | ICD-10-CM

## 2015-05-23 DIAGNOSIS — J471 Bronchiectasis with (acute) exacerbation: Secondary | ICD-10-CM | POA: Diagnosis not present

## 2015-05-23 NOTE — Assessment & Plan Note (Signed)
This has been a stable interval for her.  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 Her weight is up which is good.  I told her today that I want her to keep adhering to three principles for chronic bronchiectasis management: 1) Good nutrition> weight up, good 2) Mucociliary clearance> therapy vest bid plus exercise 3) Treat flare ups with the right antibiotic> in her case cipro for 14 days.

## 2015-05-23 NOTE — Progress Notes (Signed)
Subjective:    Patient ID: Destiny Rodriguez, female    DOB: 1924/10/29, 81 y.o.   MRN: [id number]  Synopsis: Former Patient of Dr. Gwenette Greet with COPD and MAC; She does better with longer courses of antibiotics (14 days) when she has flare ups. Rx with clarithro/ethamb 2000-2001 Started clarithro/ethamb/rif 08/2010 CT chest 04/2011: large cavitary disease superimposed over bronchiectasis.  Started on MAC meds per ID 05/2011 CT chest 12/20/12 >Bronchiectasis, pulmonary parenchymal cysts,   A collapsed cyst in the left upper lobe with associated presumed pulmonary parenchymal scarring   finished 56mos of treatment ending May 2014.   Uses therapy vest twice a day.  Spirometry 2010: FEV1 1.26 (71%), ratio 65. Good response to LABA/ICS  AUGUST 2016 Sputum> pseudomonas, cipro sensitive August 2016 Sputum > MAC positive x2  HPI Chief Complaint  Patient presents with  . Follow-up    pt states she is doing well.  pt does note a chronic sore throat, intermittent PND.     Destiny Rodriguez thinks that her most recent round of antibiotics really made a difference in her breathing. She has not had nay trouble since completing the extended course of antibiotics. She is exercising daily > 20-30 minutes around her house every day.  She gets short of breath with this. She continues to use her therapy vest twice a day.  Past Medical History  Diagnosis Date  . Allergic rhinitis   . Hyperlipidemia   . Chronic sinusitis   . Bronchiectasis   . Rheumatism       Review of Systems     Objective:   Physical Exam Filed Vitals:   05/23/15 1502  BP: 124/72  Pulse: 83  Height: 5\' 4"  (1.626 m)  Weight: 100 lb 9.6 oz (45.632 kg)  SpO2: 95%    Gen: well appearing HENT: OP clear, TM's clear, neck supple PULM: Crackles both bases bilaterally CV: RRR, slight systolic murmur RUSB, and LLSB, trace edema GI: BS+, soft, nontender Derm: no cyanosis or rash Psyche: normal mood and affect       Assessment  & Plan:  Bronchiectasis without acute exacerbation ([place]) This has been a stable interval for her.  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 Her weight is up which is good.  I told her today that I want her to keep adhering to three principles for chronic bronchiectasis management: 1) Good nutrition> weight up, good 2) Mucociliary clearance> therapy vest bid plus exercise 3) Treat flare ups with the right antibiotic> in her case cipro for 14 days.   Mycobacterium avium-intracellulare complex ([place]) She is chronically colonized with MAC but doesn't show signs of active disease right now.  If she lost weight, had refractory dyspnea and worsening mucus symptoms then I would only consider treating her if she failed treatment with cipro for a regular "flare up of bronchiectasis".     Current outpatient prescriptions:  .  albuterol (PROVENTIL HFA;VENTOLIN HFA) 108 (90 BASE) MCG/ACT inhaler, Inhale 2 puffs into the lungs every 4 (four) hours as needed for wheezing or shortness of breath., Disp: 1 Inhaler, Rfl: 3 .  albuterol (PROVENTIL) (2.5 MG/3ML) 0.083% nebulizer solution, Take 3 mLs (2.5 mg total) by nebulization every 6 (six) hours as needed for wheezing or shortness of breath., Disp: 360 mL, Rfl: 11 .  Biotin 1000 MCG tablet, Take 1,000 mcg by mouth daily., Disp: , Rfl:  .  budesonide-formoterol (SYMBICORT) 80-4.5 MCG/ACT inhaler, Inhale 2 puffs into the lungs  2 (two) times daily., Disp: 1 Inhaler, Rfl: 12 .  calcium carbonate (OS-CAL) 600 MG TABS, Take 600 mg by mouth 2 (two) times daily with a meal. , Disp: , Rfl:  .  chlorpheniramine (CHLOR-TRIMETON) 4 MG tablet, Take 4 mg by mouth at bedtime., Disp: , Rfl:  .  guaiFENesin (MUCINEX) 600 MG 12 hr tablet, Take 2 tablets (1,200 mg total) by mouth 2 (two) times daily. (Patient taking differently: Take 1,200 mg by mouth 2 (two) times daily as needed. ), Disp: , Rfl:  .  MULTIPLE VITAMIN PO,  Take 1 tablet by mouth daily. , Disp: , Rfl:

## 2015-05-23 NOTE — Assessment & Plan Note (Signed)
She is chronically colonized with MAC but doesn't show signs of active disease right now.  If she lost weight, had refractory dyspnea and worsening mucus symptoms then I would only consider treating her if she failed treatment with cipro for a regular "flare up of bronchiectasis".

## 2015-05-23 NOTE — Patient Instructions (Signed)
Keep using your therapy vest as you are doing Keep taking your medications as  You are doing  stay active, exercises regularly   I will see you back in 6 months or sooner if needed

## 2015-06-10 ENCOUNTER — Encounter: Payer: Self-pay | Admitting: Internal Medicine

## 2015-06-10 ENCOUNTER — Ambulatory Visit (INDEPENDENT_AMBULATORY_CARE_PROVIDER_SITE_OTHER): Payer: Medicare Other | Admitting: Internal Medicine

## 2015-06-10 VITALS — BP 137/79 | HR 86 | Temp 97.7°F | Wt 100.0 lb

## 2015-06-10 DIAGNOSIS — A310 Pulmonary mycobacterial infection: Secondary | ICD-10-CM

## 2015-06-10 DIAGNOSIS — J479 Bronchiectasis, uncomplicated: Secondary | ICD-10-CM

## 2015-06-16 NOTE — Progress Notes (Signed)
Rfv: pulmonary mac and bronchiectasis Subjective:    Patient ID: Destiny Rodriguez, female    DOB: 1924/08/28, 82 y.o.   MRN: [id number]  HPI 82yo F who has long standing bronchiectasis, COPD and hx of pulmonary MAC. She states that she was recently started on night time oxygen supplementation by Dr. Fayne Mediate. She states that she is doing very well for Winter. Denies any significant productive cough. She has still fairly good energy levels. Occasional post sinus drainage but overall she rates her quality of life as good for now.  Allergies  Allergen Reactions  . Tequin Rash   Current Outpatient Prescriptions on File Prior to Visit  Medication Sig Dispense Refill  . albuterol (PROVENTIL HFA;VENTOLIN HFA) 108 (90 BASE) MCG/ACT inhaler Inhale 2 puffs into the lungs every 4 (four) hours as needed for wheezing or shortness of breath. 1 Inhaler 3  . albuterol (PROVENTIL) (2.5 MG/3ML) 0.083% nebulizer solution Take 3 mLs (2.5 mg total) by nebulization every 6 (six) hours as needed for wheezing or shortness of breath. 360 mL 11  . Biotin 1000 MCG tablet Take 1,000 mcg by mouth daily.    . budesonide-formoterol (SYMBICORT) 80-4.5 MCG/ACT inhaler Inhale 2 puffs into the lungs 2 (two) times daily. 1 Inhaler 12  . calcium carbonate (OS-CAL) 600 MG TABS Take 600 mg by mouth 2 (two) times daily with a meal.     . chlorpheniramine (CHLOR-TRIMETON) 4 MG tablet Take 4 mg by mouth at bedtime.    Marland Kitchen guaiFENesin (MUCINEX) 600 MG 12 hr tablet Take 2 tablets (1,200 mg total) by mouth 2 (two) times daily. (Patient taking differently: Take 1,200 mg by mouth 2 (two) times daily as needed. )    . MULTIPLE VITAMIN PO Take 1 tablet by mouth daily.      No current facility-administered medications on file prior to visit.   Active Ambulatory Problems    Diagnosis Date Noted  . Mycobacterium avium-intracellulare complex ([place]) 06/07/2008  . HYPERLIPIDEMIA 03/25/2007  . SINUSITIS, CHRONIC 06/07/2008  . ALLERGIC RHINITIS  06/07/2008  . Bronchiectasis without acute exacerbation ([place]) 03/25/2007  . COPD (chronic obstructive pulmonary disease) with chronic bronchitis ([place]) 06/07/2008  . HIATAL HERNIA 03/25/2007  . Black stool 11/23/2010  . Pneumonia 12/06/2013  . Hemoptysis 12/07/2013   Resolved Ambulatory Problems    Diagnosis Date Noted  . UNSPECIFIED BACTERIAL PNEUMONIA 04/01/2010  . Hemoptysis 10/15/2008  . HEMOPTYSIS UNSPECIFIED 04/01/2010  . Hemoptysis 11/23/2010  . MAC (mycobacterium avium-intracellulare complex) 05/27/2011   Past Medical History  Diagnosis Date  . Allergic rhinitis   . Hyperlipidemia   . Chronic sinusitis   . Bronchiectasis   . Rheumatism       Review of Systems occ productive cough due to sinus congestion. Otherwise 10 point ros is negative     Objective:   Physical Exam Blood pressure 137/79, pulse 86, temperature 97.7 F (36.5 C), weight 100 lb (45.36 kg). Physical Exam  Constitutional:  oriented to person, place, and time. appears well-developed and well-nourished. No distress.  HENT: [place]/AT, PERRLA, no scleral icterus Mouth/Throat: Oropharynx is clear and moist. No oropharyngeal exudate.  Cardiovascular: Normal rate, regular rhythm and normal heart sounds. Exam reveals no gallop and no friction rub.  No murmur heard.  Pulmonary/Chest: Effort normal and breath sounds normal. No respiratory distress.  has no wheezes.  Neck = supple, no nuchal rigidity Abdominal: Soft. Bowel sounds are normal.  exhibits no distension. There is no tenderness.  Lymphadenopathy: no cervical adenopathy. No axillary  adenopathy Neurological: alert and oriented to person, place, and time.  Skin: Skin is warm and dry. No rash noted. No erythema.  Psychiatric: a normal mood and affect.  behavior is normal.      Assessment & Plan:  Pulmonary mac = for now appears that she is asymptomatic. Though believe that she is colonized. Continue to monitor periodically to see if she would warrant  retreatment  Bronchiectasis = continue with her home regimen.  Health maintenance = already received flu vaccine

## 2015-06-17 ENCOUNTER — Telehealth: Payer: Self-pay | Admitting: Pulmonary Disease

## 2015-06-17 MED ORDER — AZITHROMYCIN 250 MG PO TABS: ORAL_TABLET | ORAL | Status: DC

## 2015-06-17 NOTE — Telephone Encounter (Signed)
Called spoke with patient, advised of MW's recs as stated below Pt voiced her understanding and requests the rx be sent to [facility] Rx sent Pt will call the office in 5-7 days if her symptoms do not improve for a visit  Nothing further needed at this time; will sign off *allergy warning discussed with MW and Laurie Panda for the zpak

## 2015-06-17 NOTE — Telephone Encounter (Signed)
zpak and f/u one week if not better 

## 2015-06-17 NOTE — Telephone Encounter (Signed)
Spoke with pt. States that she has developed a cough. Cough is producing clear mucus. Has been running a temp of 100. Was unsure if she was having chest tightness, wheezing or SOB. Onset of cough was 3-4 days ago. Would like to have something sent in.  MW - please advise. Thanks.

## 2015-06-19 ENCOUNTER — Telehealth: Payer: Self-pay | Admitting: Pulmonary Disease

## 2015-06-19 NOTE — Telephone Encounter (Signed)
Spoke with pt. States that she coughed up blood this AM at 4:00. Since then she has been coughing up mucus with "old blood" in it. Advised her that we would need to see her. OV has already been scheduled for tomorrow at 1:30pm. Advised her that if her symptoms get worse, she needs to call us or seek emergency care. She agreed and verbalized understanding. Nothing further was needed.

## 2015-06-20 ENCOUNTER — Ambulatory Visit (INDEPENDENT_AMBULATORY_CARE_PROVIDER_SITE_OTHER): Payer: Medicare Other | Admitting: Pulmonary Disease

## 2015-06-20 ENCOUNTER — Ambulatory Visit (INDEPENDENT_AMBULATORY_CARE_PROVIDER_SITE_OTHER)
Admission: RE | Admit: 2015-06-20 | Discharge: 2015-06-20 | Disposition: A | Discharge status: Home / Self Care | Payer: Medicare Other | Source: Ambulatory Visit | Attending: Pulmonary Disease | Admitting: Pulmonary Disease

## 2015-06-20 ENCOUNTER — Encounter: Payer: Self-pay | Admitting: Pulmonary Disease

## 2015-06-20 VITALS — BP 114/62 | HR 110 | Temp 98.3°F | Ht 64.5 in | Wt 100.6 lb

## 2015-06-20 DIAGNOSIS — J471 Bronchiectasis with (acute) exacerbation: Secondary | ICD-10-CM

## 2015-06-20 DIAGNOSIS — R05 Cough: Secondary | ICD-10-CM | POA: Diagnosis not present

## 2015-06-20 MED ORDER — AZITHROMYCIN 250 MG PO TABS: 250.0000 mg | ORAL_TABLET | Freq: Every day | ORAL | Status: DC

## 2015-06-20 NOTE — Progress Notes (Signed)
Subjective:    Patient ID: Destiny Rodriguez, female    DOB: 1924/08/08, 82 y.o.   MRN: [id number]  HPI Acute visit with bronchiectasis   Destiny Rodriguez is a 82 year old with history of bronchiectasis, pulmonary MAC infection with colonization, finished treatment in April 2014. She developed cough with mucus production, fevers to 101 earlier this week. She has some minor hemoptysis with old blood stained mucus. She started on a Z-Pak and is on day 4 of therapy. She reports that her cough has improved with less mucus production and she is not febrile anymore.  DATA: CT chest 12/20/12 >Bronchiectasis, pulmonary parenchymal cysts, A collapsed cyst in the left upper lobe with associated presumed pulmonary parenchymal scarring  Spirometry 2010: FEV1 1.26 (71%), ratio 65.  Sputum culture 12/27/14: MAI, Pseudomonas [sensitive to Cipro and Levaquin]   Past Medical History  Diagnosis Date  . Allergic rhinitis   . Hyperlipidemia   . Chronic sinusitis   . Bronchiectasis   . Rheumatism     Current outpatient prescriptions:  .  albuterol (PROVENTIL HFA;VENTOLIN HFA) 108 (90 BASE) MCG/ACT inhaler, Inhale 2 puffs into the lungs every 4 (four) hours as needed for wheezing or shortness of breath., Disp: 1 Inhaler, Rfl: 3 .  albuterol (PROVENTIL) (2.5 MG/3ML) 0.083% nebulizer solution, Take 3 mLs (2.5 mg total) by nebulization every 6 (six) hours as needed for wheezing or shortness of breath., Disp: 360 mL, Rfl: 11 .  azithromycin (ZITHROMAX) 250 MG tablet, Take as directed, Disp: 6 each, Rfl: 0 .  Biotin 1000 MCG tablet, Take 1,000 mcg by mouth daily., Disp: , Rfl:  .  budesonide-formoterol (SYMBICORT) 80-4.5 MCG/ACT inhaler, Inhale 2 puffs into the lungs 2 (two) times daily., Disp: 1 Inhaler, Rfl: 12 .  calcium carbonate (OS-CAL) 600 MG TABS, Take 600 mg by mouth 2 (two) times daily with a meal. , Disp: , Rfl:  .  chlorpheniramine (CHLOR-TRIMETON) 4 MG tablet, Take 4 mg by mouth at bedtime., Disp: ,  Rfl:  .  guaiFENesin (MUCINEX) 600 MG 12 hr tablet, Take 2 tablets (1,200 mg total) by mouth 2 (two) times daily. (Patient taking differently: Take 1,200 mg by mouth 2 (two) times daily as needed. ), Disp: , Rfl:  .  MULTIPLE VITAMIN PO, Take 1 tablet by mouth daily. , Disp: , Rfl:  .  OXYGEN, Inhale into the lungs at bedtime., Disp: , Rfl:   Review of Systems Cough, mucus production, only blood. Hemoptysis. Positive fevers, chills. No malaise, fatigue. No nausea, vomiting, diarrhea, constipation. No chest pain, palpitation. All other review of systems are negative.    Objective:   Physical Exam  Blood pressure 114/62, pulse 110, temperature 98.3 F (36.8 C), temperature source Oral, height 5' 4.5" (1.638 m), weight 100 lb 9.6 oz (45.632 kg), SpO2 93 %.  Gen: No apparent distress Neuro: No gross focal deficits. Neck: No JVD, lymphadenopathy, thyromegaly. RS: Bibasilar crackles. No wheeze CVS: S1-S2 heard, no murmurs rubs gallops. Abdomen: Soft, positive bowel sounds. Extremities: No edema.    Assessment & Plan:  Acute bronchiectasis exacerbation, hemoptysis. MAI infection with colonization  She has an acute bronchiectasis exacerbation. Her last sputum culture shows pseudononas (sensitive to Levo and cipro) and MAI. She has already been started on azithromycin (day 4/5) and appears to be responding appropriately to therapy.  I will continue the same antibiotic without change and extend the course to total of 14 days. If she should have another exacerbation then consider Cipro/levofloxacin. If her hemoptysis worsens  then she will call us back or go to ED.   Get sputum culture, chest x-ray.  Plan: -CXR, sputum cultures - Continue azithro for total 14 days.  Marshell Garfinkel MD [facility] Pager [phone] If no answer or after 3pm call: [phone] 06/20/2015, 2:08 PM

## 2015-06-20 NOTE — Patient Instructions (Signed)
We'll get a chest x-ray and sputum cultures. You're already on azithromycin. Continue taking 250 mg a day for 9 more days for a total of 14 days of therapy.

## 2015-06-23 ENCOUNTER — Other Ambulatory Visit: Payer: Medicare Other

## 2015-06-23 DIAGNOSIS — J471 Bronchiectasis with (acute) exacerbation: Secondary | ICD-10-CM | POA: Diagnosis not present

## 2015-06-24 ENCOUNTER — Telehealth: Payer: Self-pay

## 2015-06-24 NOTE — Telephone Encounter (Signed)
Spoke with Anne Ng at [facility], states that pt's sputum culture and smear resulted as 2+ positive.   Sending to DOD as BQ is off this afternoon.   TP please advise if this needs to be handled today, or if this can wait for BQ's return.  Thanks!

## 2015-06-24 NOTE — Telephone Encounter (Signed)
Will forward to BQ to make aware of lab results.  Please advise if anything further is needed.  Thanks!

## 2015-06-24 NOTE — Telephone Encounter (Signed)
She is know to have MAI colonization. No need for acute intervention. Can follow up with BQ later.

## 2015-06-24 NOTE — Telephone Encounter (Signed)
Is this 2+ ? [facility]  She was just seen by Dr. Vaughan Browner , did he order this?  She has known MAI , followed by ID clinic  Can you check with Dr. Vaughan Browner on this

## 2015-06-24 NOTE — Telephone Encounter (Signed)
This was 2+ [facility]. This was order by Dr. Vaughan Browner.  Please advise Dr. Vaughan Browner thanks

## 2015-06-25 NOTE — Telephone Encounter (Signed)
Called spoke with pt and made aware. She reports she is doing okay but  Not any worse. Cough is better. She just doesn't have much energy. Will let Dr. Lake Bells know.

## 2015-06-25 NOTE — Telephone Encounter (Signed)
Let her know that we will wait for the final results before doing anything. Please check to make sure she is doing OK since taking the 14 days of antibiotics.

## 2015-06-26 NOTE — Telephone Encounter (Signed)
Health dept is calling needs labs and office notes on this pt due to the sputum culture there fax [phone] Destiny Rodriguez - [phone]

## 2015-06-26 NOTE — Telephone Encounter (Signed)
Will send to BQ as FYI.

## 2015-06-26 NOTE — Telephone Encounter (Signed)
Called spoke with Hoyle Sauer to see what all she would like faxed - Hoyle Sauer requested the last 2 ov notes.  Records printed and faxed to verified fax number Nothing further needed, will sign off

## 2015-06-26 NOTE — Telephone Encounter (Signed)
Sure, please send the health department whatever they need

## 2015-07-07 ENCOUNTER — Telehealth: Payer: Self-pay | Admitting: Pulmonary Disease

## 2015-07-07 NOTE — Telephone Encounter (Signed)
Received call report from [facility] with [place] [facility], [phone], with ID from [facility] culture sent on 06/23/15. Positive for Mycobacterium avium intracellular complex.   Will send to BQ.

## 2015-07-08 NOTE — Telephone Encounter (Signed)
Got it Nothing to do

## 2015-07-11 IMAGING — CR DG CHEST 2V
2 series · 2 of 2 positions shown · non-contrast
Comparison: 09/18/2013 and earlier.

CLINICAL DATA: 89-year-old female with hemoptysis cough and
shortness of Breath. Initial encounter. History of bronchiectasis,
Ivet.

EXAM:
CHEST  2 VIEW

[view not recorded (1 of 2)]
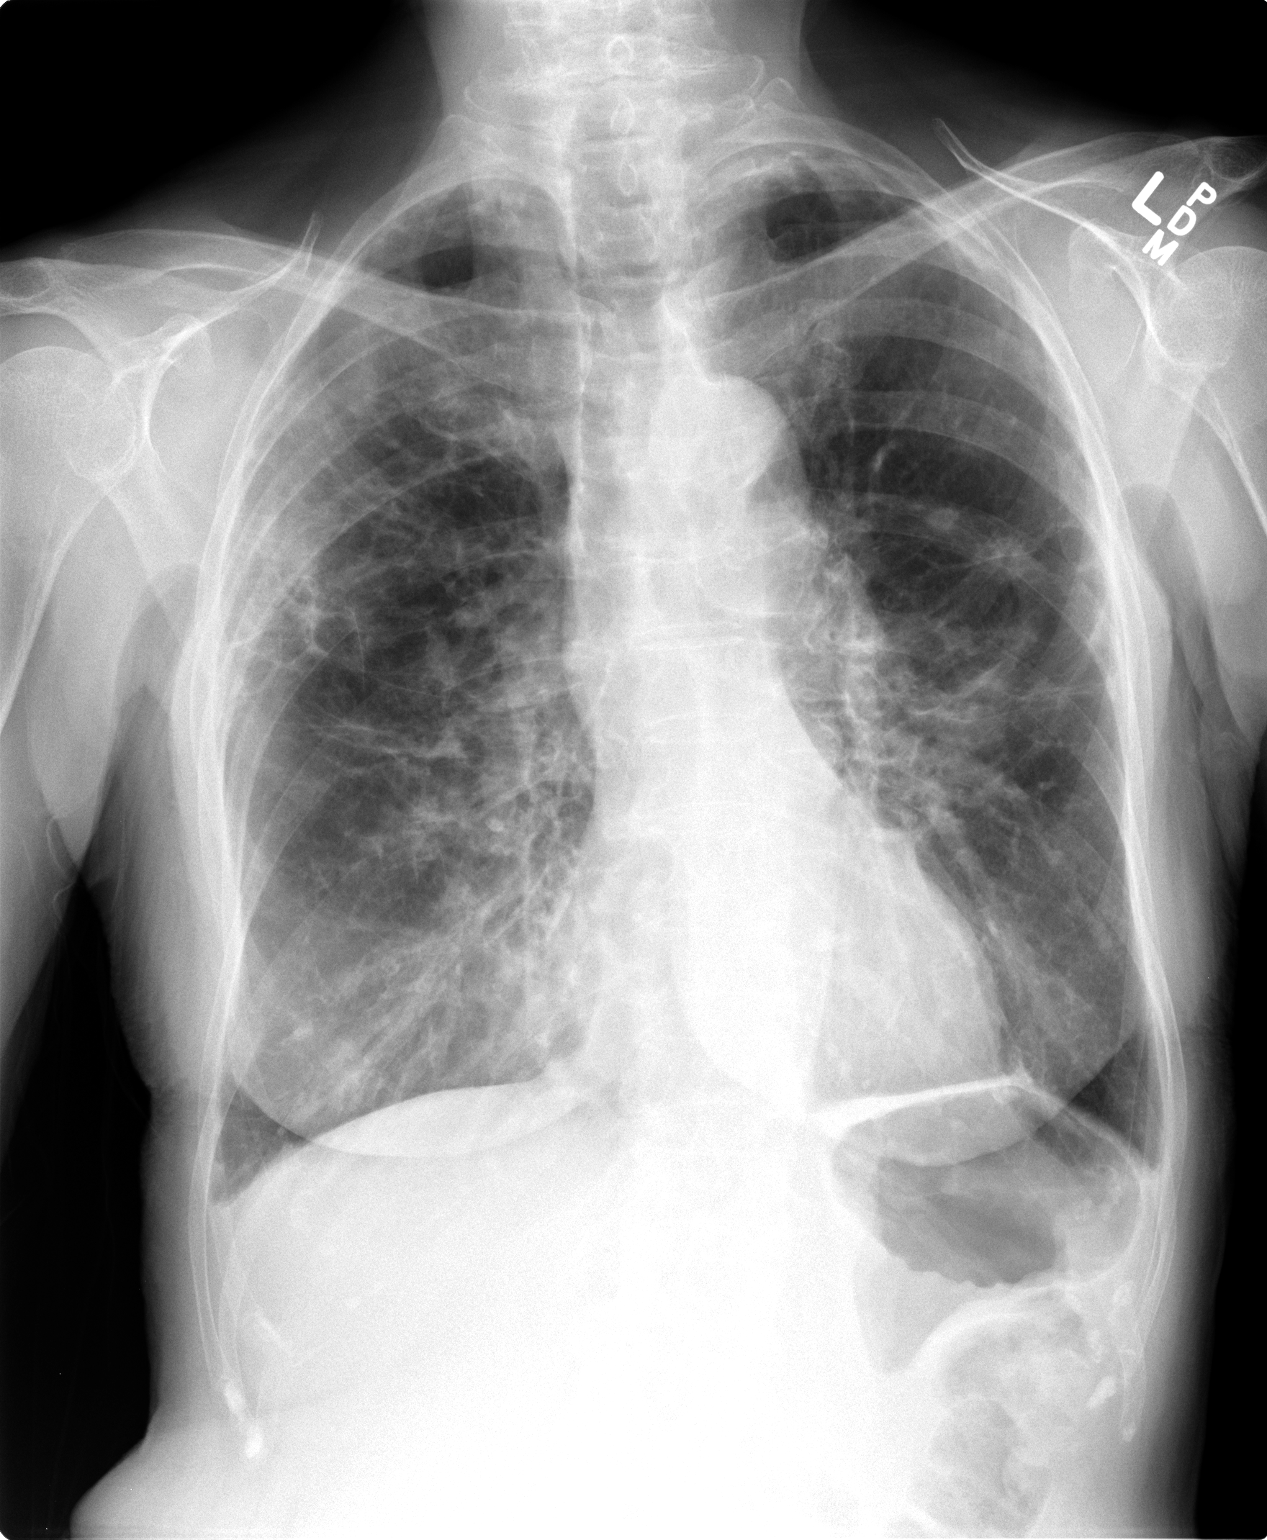

[view not recorded (2 of 2)]
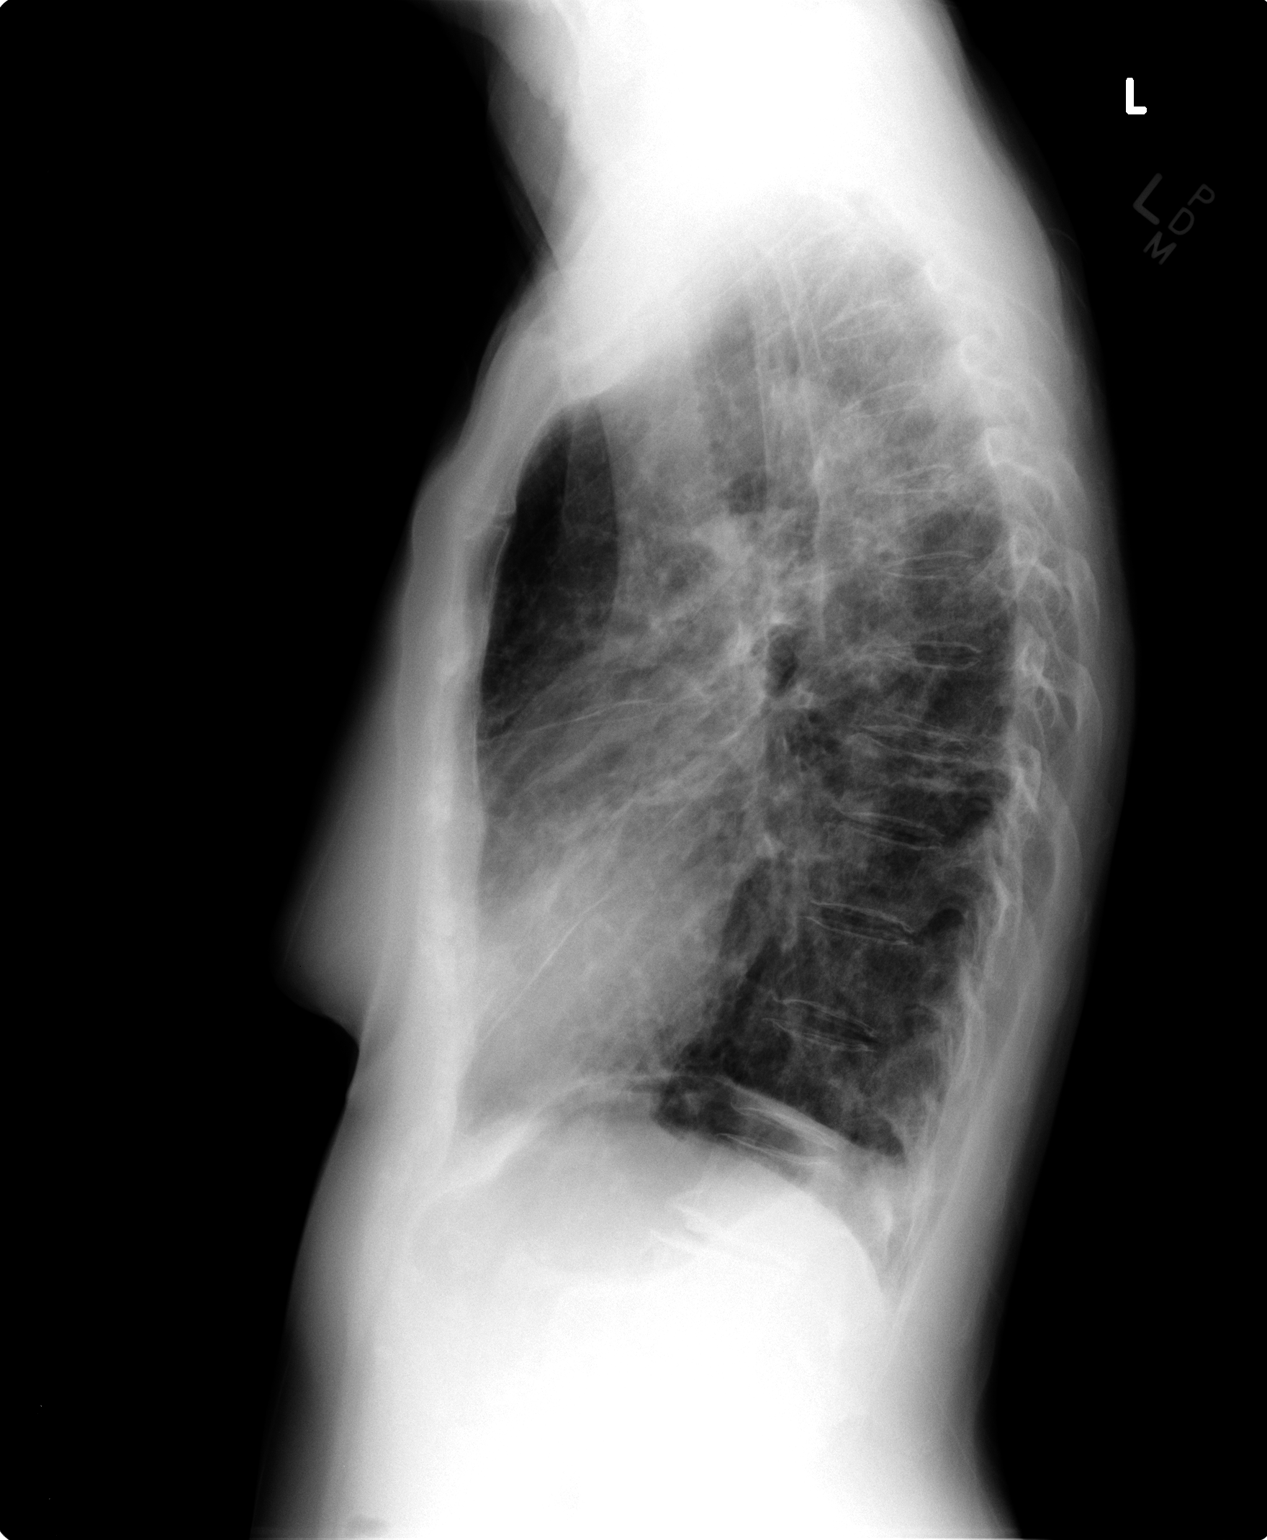

[2 of 2 positions shown; findings below may reference images not displayed]

FINDINGS: Scattered severe bronchiectasis demonstrated by CT in 5087.
Associated scattered increased interstitial opacity with mild
architectural distortion is again noted. Mild interval increased
density in the periphery of the right upper lobe. No pulmonary
edema. No definite pleural effusion. No pneumothorax. Stable cardiac
size and mediastinal contours. Visualized tracheal air column is
within normal limits. No acute osseous abnormality identified.
IMPRESSION: Moderate to severe chronic lung disease. Mildly increased opacity in
the periphery of the right upper lobe since comparison suggesting
acute infectious exacerbation.

## 2015-07-13 IMAGING — CR DG CHEST 2V
2 series · 2 of 2 positions shown · non-contrast
Comparison: Chest radiograph December 05, 2013, CT of the chest December 20, 2012

CLINICAL DATA: Hemoptysis.

EXAM:
CHEST  2 VIEW

[w chest pa]
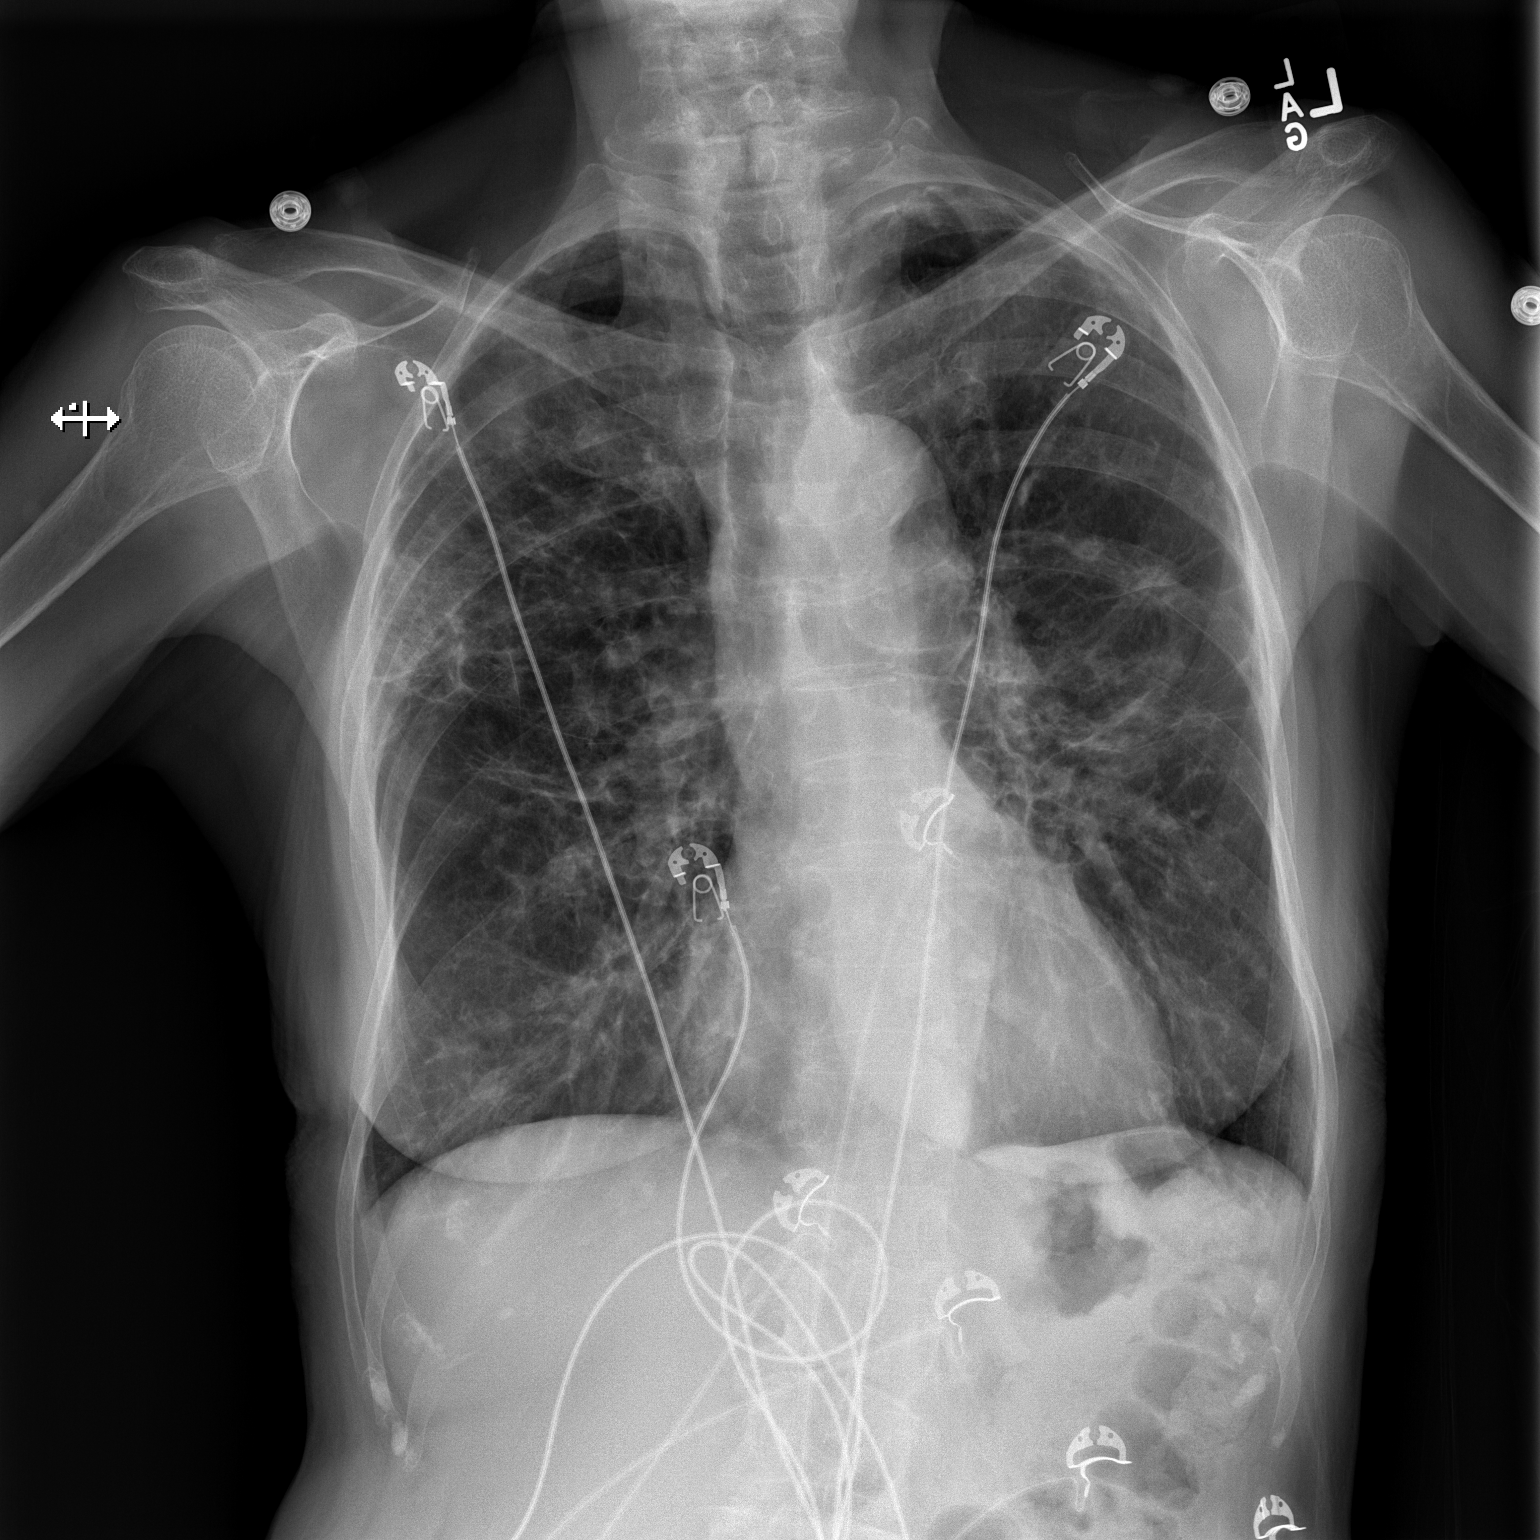

[w chest lat]
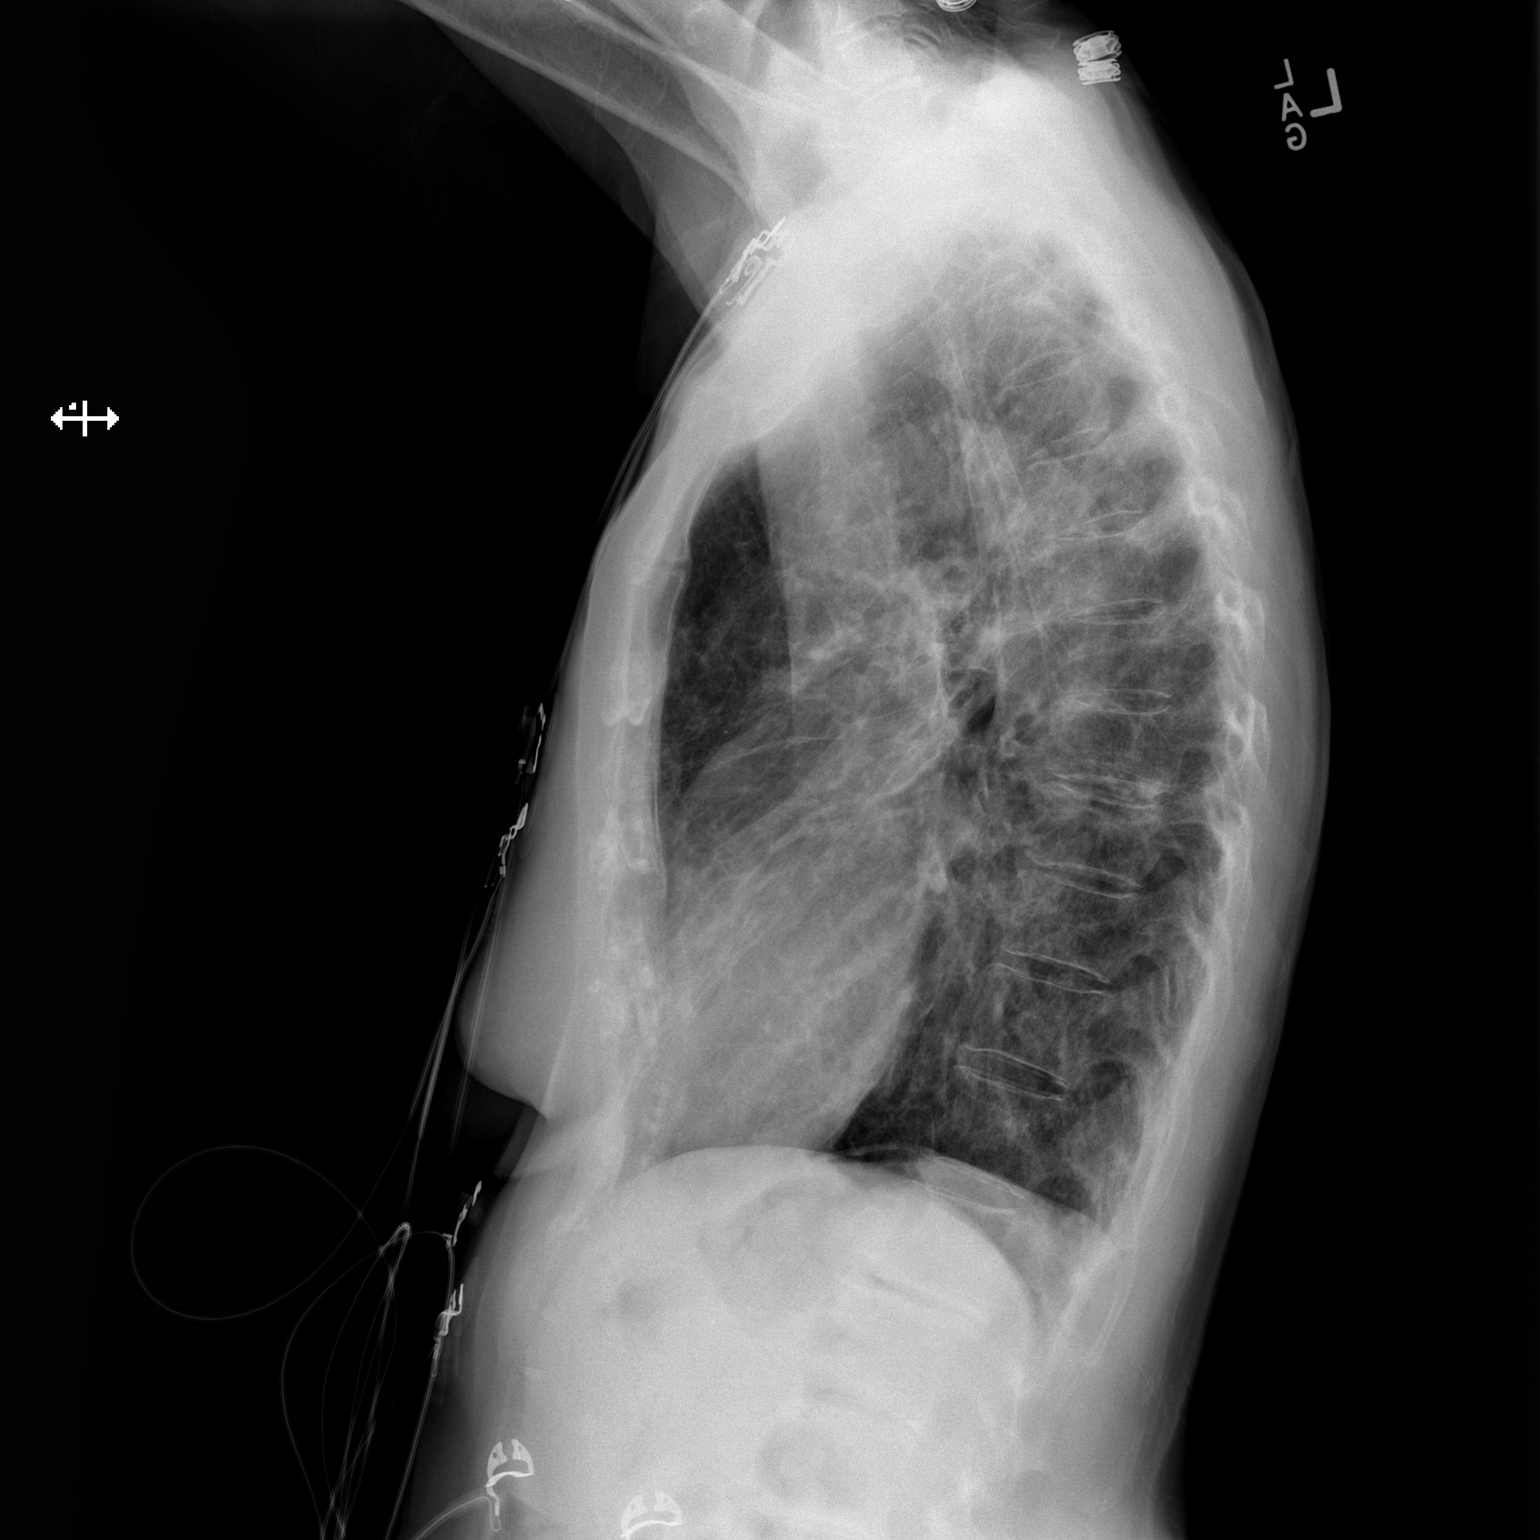

[2 of 2 positions shown; findings below may reference images not displayed]

FINDINGS: Cardiac silhouette is upper limits of normal, mediastinal silhouette
is nonsuspicious. Diffuse chronic interstitial changes with patchy
right upper lobe airspace opacity. Scattered nodular densities. No
pleural effusions. No pneumothorax. Soft tissue planes and included
osseous structures are nonsuspicious. Multiple EKG lines overlie the
patient and may obscure subtle underlying pathology.
IMPRESSION: Moderate to severe chronic interstitial lung disease with patchy
right upper lobe airspace opacity, unchanged could reflect scarring
and/or superimposed pneumonia. Additional scattered nodular
densities. Recommend followup chest radiograph after treatment to
verify improvement. If nodules persist, recommend followup CT of the
chest.

  By: [place]al[place]ia Saparilla

## 2015-07-16 ENCOUNTER — Encounter: Payer: Self-pay | Admitting: Pulmonary Disease

## 2015-07-16 DIAGNOSIS — J449 Chronic obstructive pulmonary disease, unspecified: Secondary | ICD-10-CM

## 2015-07-16 NOTE — Telephone Encounter (Signed)
Please see mychart message.

## 2015-07-17 DIAGNOSIS — H1013 Acute atopic conjunctivitis, bilateral: Secondary | ICD-10-CM | POA: Diagnosis not present

## 2015-07-17 NOTE — Telephone Encounter (Signed)
Order entered for Pulmonary Rehab. Patient notified via mychart. Nothing further needed.

## 2015-07-31 DIAGNOSIS — H1013 Acute atopic conjunctivitis, bilateral: Secondary | ICD-10-CM | POA: Diagnosis not present

## 2015-08-04 ENCOUNTER — Encounter (HOSPITAL_COMMUNITY): Payer: Self-pay

## 2015-08-04 ENCOUNTER — Encounter (HOSPITAL_COMMUNITY)
Admission: RE | Admit: 2015-08-04 | Discharge: 2015-08-04 | Disposition: A | Discharge status: Home / Self Care | Payer: Medicare Other | Source: Ambulatory Visit | Attending: Pulmonary Disease | Admitting: Pulmonary Disease

## 2015-08-04 VITALS — BP 142/66 | HR 103 | Resp 18 | Ht 65.0 in | Wt 99.4 lb

## 2015-08-04 DIAGNOSIS — J449 Chronic obstructive pulmonary disease, unspecified: Secondary | ICD-10-CM | POA: Insufficient documentation

## 2015-08-04 DIAGNOSIS — J42 Unspecified chronic bronchitis: Secondary | ICD-10-CM

## 2015-08-04 HISTORY — DX: Migraine, unspecified, not intractable, without status migrainosus: G43.909

## 2015-08-04 HISTORY — DX: Pulmonary mycobacterial infection: A31.0

## 2015-08-04 NOTE — Progress Notes (Signed)
Pulmonary Individual Treatment Plan  Patient Details  Name: Destiny Rodriguez MRN: [id number] Date of Birth: 1924/12/17 Referring Provider:  Lajean Manes, MD  Initial Encounter Date:   Visit Diagnosis: Chronic bronchitis, unspecified chronic bronchitis type ([place])  Patient's Home Medications on Admission:   Current outpatient prescriptions:  .  albuterol (PROVENTIL HFA;VENTOLIN HFA) 108 (90 BASE) MCG/ACT inhaler, Inhale 2 puffs into the lungs every 4 (four) hours as needed for wheezing or shortness of breath., Disp: 1 Inhaler, Rfl: 3 .  albuterol (PROVENTIL) (2.5 MG/3ML) 0.083% nebulizer solution, Take 3 mLs (2.5 mg total) by nebulization every 6 (six) hours as needed for wheezing or shortness of breath., Disp: 360 mL, Rfl: 11 .  Biotin 1000 MCG tablet, Take 1,000 mcg by mouth daily., Disp: , Rfl:  .  budesonide-formoterol (SYMBICORT) 80-4.5 MCG/ACT inhaler, Inhale 2 puffs into the lungs 2 (two) times daily., Disp: 1 Inhaler, Rfl: 12 .  calcium carbonate (OS-CAL) 600 MG TABS, Take 600 mg by mouth 2 (two) times daily with a meal. , Disp: , Rfl:  .  chlorpheniramine (CHLOR-TRIMETON) 4 MG tablet, Take 4 mg by mouth at bedtime., Disp: , Rfl:  .  guaiFENesin (MUCINEX) 600 MG 12 hr tablet, Take 2 tablets (1,200 mg total) by mouth 2 (two) times daily. (Patient taking differently: Take 1,200 mg by mouth 2 (two) times daily as needed. ), Disp: , Rfl:  .  MULTIPLE VITAMIN PO, Take 1 tablet by mouth daily. , Disp: , Rfl:  .  OXYGEN, Inhale into the lungs at bedtime., Disp: , Rfl:   Past Medical History: Past Medical History  Diagnosis Date  . Allergic rhinitis   . Hyperlipidemia   . Chronic sinusitis   . Bronchiectasis   . Rheumatism   . MAC (mycobacterium avium-intracellulare complex)   . Migraine     Tobacco Use: History  Smoking status  . Never Smoker   Smokeless tobacco  . Never Used    Labs:     Recent Review Flowsheet Data    There is no flowsheet data to display.       Capillary Blood Glucose: No results found for: GLUCAP   ADL UCSD:   Pulmonary Function Assessment:     Pulmonary Function Assessment - 08/04/15 1435    Breath   Bilateral Breath Sounds Rhonchi;Rales  bases bilat   Shortness of Breath Yes      Exercise Target Goals:    Exercise Program Goal: Individual exercise prescription set with THRR, safety & activity barriers. Participant demonstrates ability to understand and report RPE using BORG scale, to self-measure pulse accurately, and to acknowledge the importance of the exercise prescription.  Exercise Prescription Goal: Starting with aerobic activity 30 plus minutes a day, 3 days per week for initial exercise prescription. Provide home exercise prescription and guidelines that participant acknowledges understanding prior to discharge.  Activity Barriers & Risk Stratification:     Activity Barriers & Cardiac Risk Stratification - 08/04/15 1433    Activity Barriers & Cardiac Risk Stratification   Activity Barriers Shortness of Breath;Deconditioning;Back Problems  back pain limits her gardening      6 Minute Walk:     6 Minute Walk      08/05/15 1607       6 Minute Walk   Phase Initial     Distance 1011 feet     Walk Time 6 minutes     # of Rest Breaks 0     MPH 1.91  METS 2.47     RPE 12     Perceived Dyspnea  1     VO2 Peak 7.03     Symptoms No     Resting HR 99 bpm     Resting BP 112/70 mmHg     Max Ex. HR 112 bpm     Max Ex. BP 120/72 mmHg     Pre/Post BP   Baseline BP 112/70 mmHg     6 Minute BP 120/72 mmHg     2 Minute Post BP 112/60 mmHg     Pre/Post BP? Yes     Interval HR   Baseline HR 99     1 Minute HR 112     2 Minute HR 108     3 Minute HR 107     4 Minute HR 106     5 Minute HR 108     6 Minute HR 110     2 Minute Post HR 103     Interval Heart Rate? Yes     Interval Oxygen   Interval Oxygen? --  room air        Initial Exercise Prescription:     Initial Exercise  Prescription - 08/06/15 0800    Date of Initial Exercise Prescription   Date 08/05/15   Oxygen   Oxygen --  room air   Bike   Level 0.2   Minutes 15   METs 1.84   NuStep   Level 1   Minutes 15   METs 2   Track   Laps 9   Minutes 15   METs 2.04   Prescription Details   Frequency (times per week) 2   Duration Progress to 45 minutes of aerobic exercise without signs/symptoms of physical distress   Intensity   THRR 40-80% of Max Heartrate 53-106   Ratings of Perceived Exertion 11-13   Perceived Dyspnea 0-4   Resistance Training   Training Prescription Yes   Weight orange bands   Reps 10-12      Perform Capillary Blood Glucose checks as needed.  Exercise Prescription Changes:   Discharge Exercise Prescription (Final Exercise Prescription Changes):    Nutrition:  Target Goals: Understanding of nutrition guidelines, daily intake of sodium 1500mg , cholesterol 200mg , calories 30% from fat and 7% or less from saturated fats, daily to have 5 or more servings of fruits and vegetables.  Biometrics:     Pre Biometrics - 08/04/15 1414    Pre Biometrics   Grip Strength 23 kg       Nutrition Therapy Plan and Nutrition Goals:   Nutrition Discharge: Rate Your Plate Scores:   Psychosocial: Target Goals: Acknowledge presence or absence of depression, maximize coping skills, provide positive support system. Participant is able to verbalize types and ability to use techniques and skills needed for reducing stress and depression.  Initial Review & Psychosocial Screening:     Initial Psych Review & Screening - 08/04/15 [place]? Yes   Barriers   Psychosocial barriers to participate in program There are no identifiable barriers or psychosocial needs.;The patient should benefit from training in stress management and relaxation.   Screening Interventions   Interventions Encouraged to exercise      Quality of Life  Scores:   PHQ-9:     Recent Review Flowsheet Data    Depression screen [facility] 2/9 08/04/2015 03/06/2015 07/25/2014 04/25/2014 01/23/2013   Decreased Interest 1 0 0 0 0  Down, Depressed, Hopeless 0 0 0 0 0   PHQ - 2 Score 1 0 0 0 0      Psychosocial Evaluation and Intervention:   Psychosocial Re-Evaluation:  Education: Education Goals: Education classes will be provided on a weekly basis, covering required topics. Participant will state understanding/return demonstration of topics presented.  Learning Barriers/Preferences:     Learning Barriers/Preferences - 08/04/15 1434    Learning Barriers/Preferences   Learning Barriers None   Learning Preferences Individual Instruction      Education Topics: Risk Factor Reduction:  -Group instruction that is supported by a PowerPoint presentation. Instructor discusses the definition of a risk factor, different risk factors for pulmonary disease, and how the heart and lungs work together.     Nutrition for Pulmonary Patient:  -Group instruction provided by PowerPoint slides, verbal discussion, and written materials to support subject matter. The instructor gives an explanation and review of healthy diet recommendations, which includes a discussion on weight management, recommendations for fruit and vegetable consumption, as well as protein, fluid, caffeine, fiber, sodium, sugar, and alcohol. Tips for eating when patients are short of breath are discussed.   Pursed Lip Breathing:  -Group instruction that is supported by demonstration and informational handouts. Instructor discusses the benefits of pursed lip and diaphragmatic breathing and detailed demonstration on how to preform both.     Oxygen Safety:  -Group instruction provided by PowerPoint, verbal discussion, and written material to support subject matter. There is an overview of "What is Oxygen" and "Why do we need it".  Instructor also reviews how to create a safe environment for  oxygen use, the importance of using oxygen as prescribed, and the risks of noncompliance. There is a brief discussion on traveling with oxygen and resources the patient may utilize.   Oxygen Equipment:  -Group instruction provided by [facility] Staff utilizing handouts, written materials, and equipment demonstrations.   Signs and Symptoms:  -Group instruction provided by written material and verbal discussion to support subject matter. Warning signs and symptoms of infection, stroke, and heart attack are reviewed and when to call the physician/911 reinforced. Tips for preventing the spread of infection discussed.   Advanced Directives:  -Group instruction provided by verbal instruction and written material to support subject matter. Instructor reviews Advanced Directive laws and proper instruction for filling out document.   Pulmonary Video:  -Group video education that reviews the importance of medication and oxygen compliance, exercise, good nutrition, pulmonary hygiene, and pursed lip and diaphragmatic breathing for the pulmonary patient.   Exercise for the Pulmonary Patient:  -Group instruction that is supported by a PowerPoint presentation. Instructor discusses benefits of exercise, core components of exercise, frequency, duration, and intensity of an exercise routine, importance of utilizing pulse oximetry during exercise, safety while exercising, and options of places to exercise outside of rehab.     Pulmonary Medications:  -Verbally interactive group education provided by instructor with focus on inhaled medications and proper administration.   Anatomy and Physiology of the Respiratory System and Intimacy:  -Group instruction provided by PowerPoint, verbal discussion, and written material to support subject matter. Instructor reviews respiratory cycle and anatomical components of the respiratory system and their functions. Instructor also reviews differences in obstructive and  restrictive respiratory diseases with examples of each. Intimacy, Sex, and Sexuality differences are reviewed with a discussion on how relationships can change when diagnosed with pulmonary disease. Common sexual concerns are reviewed.   Knowledge Questionnaire Score:   Personal Goals and Risk Factors  at Admission:     Personal Goals and Risk Factors at Admission - 08/04/15 1438    Core Components/Risk Factors/Patient Goals on Admission    Weight Management Yes   Intervention Weight Management: Develop a combined nutrition and exercise program designed to reach desired caloric intake, while maintaining appropriate intake of nutrient and fiber, sodium and fats, and appropriate energy expenditure required for the weight goal.;Weight Management: Provide education and appropriate resources to help participant work on and attain dietary goals.   Admit Weight 99 lb 6.8 oz (45.1 kg)   Expected Outcomes Short Term: Continue to assess and modify interventions until short term weight is achieved.;Long Term: Adherence to nutrition and physical activity/exercise program aimed toward attainment of established weight goal.   Improve shortness of breath with ADL's Yes   Intervention Provide education, individualized exercise plan and daily activity instruction to help decrease symptoms of SOB with activities of daily living.   Expected Outcomes Short Term: Achieves a reduction of symptoms when performing activities of daily living.   Develop more efficient breathing techniques such as purse lipped breathing and diaphragmatic breathing; and practicing self-pacing with activity Yes   Intervention Provide education, demonstration and support about specific breathing techniuqes utilized for more efficient breathing. Include techniques such as pursed lipped breathing, diaphragmatic breathing and self-pacing activity.   Expected Outcomes Short Term: Participant will be able to demonstrate and use breathing techniques  as needed throughout daily activities.   Increase knowledge of respiratory medications and ability to use respiratory devices properly  Yes   Intervention Provide education and demonstration as needed of appropriate use of medications, inhalers, and oxygen therapy.   Expected Outcomes Short Term: Achieves understanding of medications use. Understands that oxygen is a medication prescribed by physician. Demonstrates appropriate use of inhaler and oxygen therapy.   Hypertension Yes   Intervention Provide education on lifestyle modifcations including regular physical activity/exercise, weight management, moderate sodium restriction and increased consumption of fresh fruit, vegetables, and low fat dairy, alcohol moderation, and smoking cessation.;Monitor prescription use compliance.   Expected Outcomes Short Term: Continued assessment and intervention until BP is < 140/27mm HG in hypertensive participants. < 130/34mm HG in hypertensive participants with diabetes, heart failure or chronic kidney disease.;Long Term: Maintenance of blood pressure at goal levels.   Personal Goal Other Yes      Personal Goals and Risk Factors Review:    Personal Goals Discharge (Final Personal Goals and Risk Factors Review):    ITP Comments:   Comments: [name] 82 y.o. female  Pulmonary Rehab Orientation Note [phone] Patient arrived today in Cardiac and Pulmonary Rehab for orientation to Pulmonary Rehab. She was transported from [employer] via wheel chair. She does not carry portable oxygen. Per pt, she uses 2 liters of oxygen at night. Color good, skin warm and dry. Patient is oriented to time and place. Patient's medical history, psychosocial health, and medications reviewed. Psychosocial assessment reveals pt lives with their spouse in an independent living retirement community. She enjoys quilting, attending her quilting club, and being an active member in a book club and the herb society. Pt  reports her stress level is low. Areas of stress/anxiety include Health. Pt does not exhibits signs of depression. Pt shows good  coping skills with positive outlook . She is offered emotional support and reassurance. Will continue to monitor and evaluate progress toward psychosocial goal(s) of remaining positive about her future. Physical assessment reveals heart rate is tachycardic, breath sounds mild-moderate rhonchi heard both  lower lobes. Grip strength equal, strong. Distal pulses palpable. No edema noted. Patient reports she does take medications as prescribed. Patient states she follows a Low fat diet. The patient has been trying to gain weight by increasing her caloric intake by decreasing the ammount of lowfat products she consumes. She has switched to 2% milk from skim, and eating full fat yogurt.. Patient's weight will be monitored closely. Demonstration and practice of PLB using pulse oximeter. Patient able to return demonstration satisfactorily. Safety and hand hygiene in the exercise area reviewed with patient. Patient voices understanding of the information reviewed. Department expectations discussed with patient and achievable goals were set. The patient shows enthusiasm about attending the program and we look forward to working with this nice lady. The patient is scheduled for a 6 min walk test on Tuesday 3/14 at 4:00 and to begin exercise on 3/28 in the 1:30 class.

## 2015-08-05 ENCOUNTER — Encounter (HOSPITAL_COMMUNITY)
Admission: RE | Admit: 2015-08-05 | Discharge: 2015-08-05 | Disposition: A | Discharge status: Home / Self Care | Payer: Medicare Other | Source: Ambulatory Visit | Attending: Pulmonary Disease | Admitting: Pulmonary Disease

## 2015-08-05 DIAGNOSIS — J449 Chronic obstructive pulmonary disease, unspecified: Secondary | ICD-10-CM | POA: Diagnosis not present

## 2015-08-07 ENCOUNTER — Encounter (HOSPITAL_COMMUNITY): Payer: Self-pay | Admitting: *Deleted

## 2015-08-19 ENCOUNTER — Encounter (HOSPITAL_COMMUNITY)
Admission: RE | Admit: 2015-08-19 | Discharge: 2015-08-19 | Disposition: A | Discharge status: Home / Self Care | Payer: Medicare Other | Source: Ambulatory Visit | Attending: Pulmonary Disease | Admitting: Pulmonary Disease

## 2015-08-19 VITALS — Wt 97.7 lb

## 2015-08-19 DIAGNOSIS — J42 Unspecified chronic bronchitis: Secondary | ICD-10-CM

## 2015-08-19 DIAGNOSIS — J449 Chronic obstructive pulmonary disease, unspecified: Secondary | ICD-10-CM | POA: Diagnosis not present

## 2015-08-19 NOTE — Progress Notes (Signed)
Daily Session Note  Patient Details  Name: Destiny Rodriguez MRN: [id number] Date of Birth: 04/12/1925 Referring Provider:  Stoneking, Hal, MD  Encounter Date: 08/19/2015  Check In:     Session Check In - 08/19/15 1606    Check-In   Location [facility]   Staff Present  , RN, BSN;Lisa Hughes, RN;Olinty Richards, MS, ACSM CEP, Exercise Physiologist   Supervising physician immediately available to respond to emergencies [facility] immediately available   Physician(s) Dr. Merrell   Medication changes reported     No   Fall or balance concerns reported    No   Warm-up and Cool-down Performed as group-led instruction   Resistance Training Performed Yes   VAD Patient? No   Pain Assessment   Currently in Pain? No/denies   Multiple Pain Sites No      Capillary Blood Glucose: No results found for this or any previous visit (from the past 24 hour(s)).      Exercise Prescription Changes - 08/19/15 1600    Exercise Review   Progression No   Response to Exercise   Blood Pressure (Admit) 132/60 mmHg   Blood Pressure (Exercise) 122/70 mmHg   Blood Pressure (Exit) 134/74 mmHg   Heart Rate (Admit) 96 bpm   Heart Rate (Exercise) 107 bpm   Heart Rate (Exit) 104 bpm   Oxygen Saturation (Admit) 94 %   Oxygen Saturation (Exercise) 92 %   Oxygen Saturation (Exit) 94 %   Rating of Perceived Exertion (Exercise) 13   Perceived Dyspnea (Exercise) 1   Symptoms left hip pain with biking, tried airdyne and recumbent bike   Comments will try other equipment to not aggravate hip pain   Duration Progress to 45 minutes of aerobic exercise without signs/symptoms of physical distress   Intensity Other (comment)  40-80% HRR   Progression   Progression Continue to progress workloads to maintain intensity without signs/symptoms of physical distress.   Resistance Training   Training Prescription Yes   Weight orange bands   Reps 10-12   Interval Training   Interval  Training No   Bike   Level 0.2   Minutes 15   NuStep   Level 1   Minutes 7   Arm Ergometer   Level 1   Minutes 15   Track   Laps 7   Minutes 15     Goals Met:  Proper associated with RPD/PD & O2 Sat Strength training completed today  Goals Unmet:  Not Applicable  Comments: Service time is from 1330 to 1515 Hip pain with the bike, will change to arm ergometer.  Dr. Wesam G. Yacoub is Medical Director for Pulmonary Rehab at [facility]. 

## 2015-08-21 ENCOUNTER — Encounter (HOSPITAL_COMMUNITY)
Admission: RE | Admit: 2015-08-21 | Discharge: 2015-08-21 | Disposition: A | Discharge status: Home / Self Care | Payer: Medicare Other | Source: Ambulatory Visit | Attending: Pulmonary Disease | Admitting: Pulmonary Disease

## 2015-08-21 ENCOUNTER — Telehealth: Payer: Self-pay | Admitting: Pulmonary Disease

## 2015-08-21 NOTE — Progress Notes (Signed)
Destiny Rodriguez did not stay to exercise, she was coughing up blood tinged sputum and has had more of a cough the last 2 days.  She has an appointment with her pulmonary practice's PA tomorrow.  She will not stay to exercise, only attended the pulmonary medications class today.

## 2015-08-21 NOTE — Telephone Encounter (Signed)
ATC pt. Line reception not good. WCB

## 2015-08-21 NOTE — Telephone Encounter (Signed)
Called and spoke with pt. Pt c/o prod cough with a very small amount of bloodily mucus this morning and chest tightness. Denies any SOB, wheezing, sinus pressure/drainage, fever, nausea or vomiting. I scheduled pt for an ov with SG on 08/22/15 due to availably within office and explained to her that if symptoms worsened to call us back or seek emergency care. She voiced understanding and had no further questions. Nothing further needed at this time.

## 2015-08-21 NOTE — Progress Notes (Deleted)
Pulmonary Individual Treatment Plan  Patient Details  Name: Destiny Rodriguez MRN: [id number] Date of Birth: 1925/04/28 Referring Provider:  Lajean Manes, MD  Initial Encounter Date:       Pulmonary Rehab Walk Test from 08/05/2015 in [place]   Date  08/05/15      Visit Diagnosis: No diagnosis found.  Patient's Home Medications on Admission:   Current outpatient prescriptions:  .  albuterol (PROVENTIL HFA;VENTOLIN HFA) 108 (90 BASE) MCG/ACT inhaler, Inhale 2 puffs into the lungs every 4 (four) hours as needed for wheezing or shortness of breath., Disp: 1 Inhaler, Rfl: 3 .  albuterol (PROVENTIL) (2.5 MG/3ML) 0.083% nebulizer solution, Take 3 mLs (2.5 mg total) by nebulization every 6 (six) hours as needed for wheezing or shortness of breath., Disp: 360 mL, Rfl: 11 .  Biotin 1000 MCG tablet, Take 1,000 mcg by mouth daily., Disp: , Rfl:  .  budesonide-formoterol (SYMBICORT) 80-4.5 MCG/ACT inhaler, Inhale 2 puffs into the lungs 2 (two) times daily., Disp: 1 Inhaler, Rfl: 12 .  calcium carbonate (OS-CAL) 600 MG TABS, Take 600 mg by mouth 2 (two) times daily with a meal. , Disp: , Rfl:  .  chlorpheniramine (CHLOR-TRIMETON) 4 MG tablet, Take 4 mg by mouth at bedtime., Disp: , Rfl:  .  guaiFENesin (MUCINEX) 600 MG 12 hr tablet, Take 2 tablets (1,200 mg total) by mouth 2 (two) times daily. (Patient taking differently: Take 1,200 mg by mouth 2 (two) times daily as needed. ), Disp: , Rfl:  .  MULTIPLE VITAMIN PO, Take 1 tablet by mouth daily. , Disp: , Rfl:  .  OXYGEN, Inhale into the lungs at bedtime., Disp: , Rfl:   Past Medical History: Past Medical History  Diagnosis Date  . Allergic rhinitis   . Hyperlipidemia   . Chronic sinusitis   . Bronchiectasis   . Rheumatism   . MAC (mycobacterium avium-intracellulare complex)   . Migraine     Tobacco Use: History  Smoking status  . Never Smoker   Smokeless tobacco  . Never Used    Labs: Recent Review  Flowsheet Data    There is no flowsheet data to display.      Capillary Blood Glucose: No results found for: GLUCAP   ADL UCSD:     Pulmonary Assessment Scores      08/07/15 1515       ADL UCSD   SOB Score total 48        Pulmonary Function Assessment:     Pulmonary Function Assessment - 08/04/15 1435    Breath   Bilateral Breath Sounds Rhonchi;Rales  bases bilat   Shortness of Breath Yes      Exercise Target Goals:    Exercise Program Goal: Individual exercise prescription set with THRR, safety & activity barriers. Participant demonstrates ability to understand and report RPE using BORG scale, to self-measure pulse accurately, and to acknowledge the importance of the exercise prescription.  Exercise Prescription Goal: Starting with aerobic activity 30 plus minutes a day, 3 days per week for initial exercise prescription. Provide home exercise prescription and guidelines that participant acknowledges understanding prior to discharge.  Activity Barriers & Risk Stratification:     Activity Barriers & Cardiac Risk Stratification - 08/04/15 1433    Activity Barriers & Cardiac Risk Stratification   Activity Barriers Shortness of Breath;Deconditioning;Back Problems  back pain limits her gardening      6 Minute Walk:     6 Minute Walk  08/05/15 1607       6 Minute Walk   Phase Initial     Distance 1011 feet     Walk Time 6 minutes     # of Rest Breaks 0     MPH 1.91     METS 2.47     RPE 12     Perceived Dyspnea  1     VO2 Peak 7.03     Symptoms No     Resting HR 99 bpm     Resting BP 112/70 mmHg     Max Ex. HR 112 bpm     Max Ex. BP 120/72 mmHg     2 Minute Post BP 112/60 mmHg     Interval HR   Baseline HR 99     1 Minute HR 112     2 Minute HR 108     3 Minute HR 107     4 Minute HR 106     5 Minute HR 108     6 Minute HR 110     2 Minute Post HR 103     Interval Heart Rate? Yes     Interval Oxygen   Interval Oxygen? --  room air      Pre/Post BP   Baseline BP 112/70 mmHg     6 Minute BP 120/72 mmHg     Pre/Post BP? Yes        Initial Exercise Prescription:     Initial Exercise Prescription - 08/06/15 0800    Date of Initial Exercise Prescription   Date 08/05/15   Oxygen   Oxygen --  room air   Bike   Level 0.2   Minutes 15   METs 1.84   NuStep   Level 1   Minutes 15   METs 2   Track   Laps 9   Minutes 15   METs 2.04   Prescription Details   Frequency (times per week) 2   Duration Progress to 45 minutes of aerobic exercise without signs/symptoms of physical distress   Intensity   THRR 40-80% of Max Heartrate 53-106   Ratings of Perceived Exertion 11-13   Perceived Dyspnea 0-4   Resistance Training   Training Prescription Yes   Weight orange bands   Reps 10-12      Perform Capillary Blood Glucose checks as needed.  Exercise Prescription Changes:     Exercise Prescription Changes      08/19/15 1600           Exercise Review   Progression No       Response to Exercise   Blood Pressure (Admit) 132/60 mmHg       Blood Pressure (Exercise) 122/70 mmHg       Blood Pressure (Exit) 134/74 mmHg       Heart Rate (Admit) 96 bpm       Heart Rate (Exercise) 107 bpm       Heart Rate (Exit) 104 bpm       Oxygen Saturation (Admit) 94 %       Oxygen Saturation (Exercise) 92 %       Oxygen Saturation (Exit) 94 %       Rating of Perceived Exertion (Exercise) 13       Perceived Dyspnea (Exercise) 1       Symptoms left hip pain with biking, tried airdyne and recumbent bike       Comments will try other equipment to not aggravate hip pain  Duration Progress to 45 minutes of aerobic exercise without signs/symptoms of physical distress       Intensity Other (comment)  40-80% HRR       Progression   Progression Continue to progress workloads to maintain intensity without signs/symptoms of physical distress.       Resistance Training   Training Prescription Yes       Weight orange bands        Reps 10-12       Interval Training   Interval Training No       Bike   Level 0.2       Minutes 15       NuStep   Level 1       Minutes 7       Arm Ergometer   Level 1       Minutes 15       Track   Laps 7       Minutes 15          Exercise Comments:   Discharge Exercise Prescription (Final Exercise Prescription Changes):     Exercise Prescription Changes - 08/19/15 1600    Exercise Review   Progression No   Response to Exercise   Blood Pressure (Admit) 132/60 mmHg   Blood Pressure (Exercise) 122/70 mmHg   Blood Pressure (Exit) 134/74 mmHg   Heart Rate (Admit) 96 bpm   Heart Rate (Exercise) 107 bpm   Heart Rate (Exit) 104 bpm   Oxygen Saturation (Admit) 94 %   Oxygen Saturation (Exercise) 92 %   Oxygen Saturation (Exit) 94 %   Rating of Perceived Exertion (Exercise) 13   Perceived Dyspnea (Exercise) 1   Symptoms left hip pain with biking, tried airdyne and recumbent bike   Comments will try other equipment to not aggravate hip pain   Duration Progress to 45 minutes of aerobic exercise without signs/symptoms of physical distress   Intensity Other (comment)  40-80% HRR   Progression   Progression Continue to progress workloads to maintain intensity without signs/symptoms of physical distress.   Resistance Training   Training Prescription Yes   Weight orange bands   Reps 10-12   Interval Training   Interval Training No   Bike   Level 0.2   Minutes 15   NuStep   Level 1   Minutes 7   Arm Ergometer   Level 1   Minutes 15   Track   Laps 7   Minutes 15       Nutrition:  Target Goals: Understanding of nutrition guidelines, daily intake of sodium <1563m, cholesterol <2076m calories 30% from fat and 7% or less from saturated fats, daily to have 5 or more servings of fruits and vegetables.  Biometrics:     Pre Biometrics - 08/04/15 1414    Pre Biometrics   Grip Strength 23 kg       Nutrition Therapy Plan and Nutrition Goals:     Nutrition  Therapy & Goals - 08/14/15 1528    Nutrition Therapy   Diet High Calorie, High Protein   Intervention Plan   Intervention Prescribe, educate and counsel regarding individualized specific dietary modifications aiming towards targeted core components such as weight, hypertension, lipid management, diabetes, heart failure and other comorbidities.   Expected Outcomes Short Term Goal: Understand basic principles of dietary content, such as calories, fat, sodium, cholesterol and nutrients.;Long Term Goal: Adherence to prescribed nutrition plan.      Nutrition Discharge: Rate Your Plate Scores:  Nutrition Assessments - 08/14/15 1529    Rate Your Plate Scores   Pre Score 63      Psychosocial: Target Goals: Acknowledge presence or absence of depression, maximize coping skills, provide positive support system. Participant is able to verbalize types and ability to use techniques and skills needed for reducing stress and depression.  Initial Review & Psychosocial Screening:     Initial Psych Review & Screening - 08/04/15 [place]? Yes   Barriers   Psychosocial barriers to participate in program There are no identifiable barriers or psychosocial needs.;The patient should benefit from training in stress management and relaxation.   Screening Interventions   Interventions Encouraged to exercise      Quality of Life Scores:     Quality of Life - 08/07/15 1515    Quality of Life Scores   Health/Function Pre 20.6 %   Socioeconomic Pre 22.56 %   Psych/Spiritual Pre 22.86 %   Family Pre 24 %   GLOBAL Pre 21.88 %      PHQ-9:     Recent Review Flowsheet Data    Depression screen [facility] 2/9 08/04/2015 03/06/2015 07/25/2014 04/25/2014 01/23/2013   Decreased Interest 1 0 0 0 0   Down, Depressed, Hopeless 0 0 0 0 0   PHQ - 2 Score 1 0 0 0 0      Psychosocial Evaluation and Intervention:   Psychosocial Re-Evaluation:     Psychosocial Re-Evaluation       08/21/15 1618           Psychosocial Re-Evaluation   Interventions Encouraged to attend Pulmonary Rehabilitation for the exercise       Comments No psychosocial issues are identified at this time, encouraged to exercise in pulmonary rehab.       Continued Psychosocial Services Needed Yes  reevaluate in 30 days for changes in psychosocial needs         Education: Education Goals: Education classes will be provided on a weekly basis, covering required topics. Participant will state understanding/return demonstration of topics presented.  Learning Barriers/Preferences:     Learning Barriers/Preferences - 08/04/15 1434    Learning Barriers/Preferences   Learning Barriers None   Learning Preferences Individual Instruction      Education Topics: Risk Factor Reduction:  -Group instruction that is supported by a PowerPoint presentation. Instructor discusses the definition of a risk factor, different risk factors for pulmonary disease, and how the heart and lungs work together.     Nutrition for Pulmonary Patient:  -Group instruction provided by PowerPoint slides, verbal discussion, and written materials to support subject matter. The instructor gives an explanation and review of healthy diet recommendations, which includes a discussion on weight management, recommendations for fruit and vegetable consumption, as well as protein, fluid, caffeine, fiber, sodium, sugar, and alcohol. Tips for eating when patients are short of breath are discussed.   Pursed Lip Breathing:  -Group instruction that is supported by demonstration and informational handouts. Instructor discusses the benefits of pursed lip and diaphragmatic breathing and detailed demonstration on how to preform both.     Oxygen Safety:  -Group instruction provided by PowerPoint, verbal discussion, and written material to support subject matter. There is an overview of "What is Oxygen" and "Why do we need it".  Instructor  also reviews how to create a safe environment for oxygen use, the importance of using oxygen as prescribed, and the risks of noncompliance. There is a brief discussion  on traveling with oxygen and resources the patient may utilize.   Oxygen Equipment:  -Group instruction provided by [facility] Staff utilizing handouts, written materials, and equipment demonstrations.   Signs and Symptoms:  -Group instruction provided by written material and verbal discussion to support subject matter. Warning signs and symptoms of infection, stroke, and heart attack are reviewed and when to call the physician/911 reinforced. Tips for preventing the spread of infection discussed.   Advanced Directives:  -Group instruction provided by verbal instruction and written material to support subject matter. Instructor reviews Advanced Directive laws and proper instruction for filling out document.   Pulmonary Video:  -Group video education that reviews the importance of medication and oxygen compliance, exercise, good nutrition, pulmonary hygiene, and pursed lip and diaphragmatic breathing for the pulmonary patient.   Exercise for the Pulmonary Patient:  -Group instruction that is supported by a PowerPoint presentation. Instructor discusses benefits of exercise, core components of exercise, frequency, duration, and intensity of an exercise routine, importance of utilizing pulse oximetry during exercise, safety while exercising, and options of places to exercise outside of rehab.     Pulmonary Medications:  -Verbally interactive group education provided by instructor with focus on inhaled medications and proper administration.          PULMONARY REHAB OTHER RESPIRATORY from 08/21/2015 in [place]   Educator  -- [facility] [place]/ Pharmacist]   Instruction Review Code  2- meets goals/outcomes      Anatomy and Physiology of the Respiratory System and Intimacy:  -Group  instruction provided by PowerPoint, verbal discussion, and written material to support subject matter. Instructor reviews respiratory cycle and anatomical components of the respiratory system and their functions. Instructor also reviews differences in obstructive and restrictive respiratory diseases with examples of each. Intimacy, Sex, and Sexuality differences are reviewed with a discussion on how relationships can change when diagnosed with pulmonary disease. Common sexual concerns are reviewed.   Knowledge Questionnaire Score:     Knowledge Questionnaire Score - 08/07/15 1514    Knowledge Questionnaire Score   Pre Score 10/13      Core Components/Risk Factors/Patient Goals at Admission:     Personal Goals and Risk Factors at Admission - 08/21/15 1623    Core Components/Risk Factors/Patient Goals on Admission   Personal Goal Other Yes      Core Components/Risk Factors/Patient Goals Review:      Goals and Risk Factor Review      08/21/15 1625           Core Components/Risk Factors/Patient Goals Review   Personal Goals Review Increase Strength and Stamina;Develop more efficient breathing techniques such as purse lipped breathing and diaphragmatic breathing and practicing self-pacing with activity.;Improve shortness of breath with ADL's;Weight Management/Obesity       Review Kahlea has only exercised one session in pulmonary rehab, we will assist her in gaining weight by giving written and verbal information in ways to do so.  We hope to increase her strength and stamina, improve her shortness of breath and develope more efficient breathing techniques with exercise in pulmonary rehab.   In approx 2-3 weeks we will address her home exercise instructions       Expected Outcomes Slow, gradual weight gain along with increased strength and stamina.  Hopefully her SOB will decrease and her ADL's will become easier.          Core Components/Risk Factors/Patient Goals at Discharge  (Final Review):      Goals  and Risk Factor Review - 08/21/15 1625    Core Components/Risk Factors/Patient Goals Review   Personal Goals Review Increase Strength and Stamina;Develop more efficient breathing techniques such as purse lipped breathing and diaphragmatic breathing and practicing self-pacing with activity.;Improve shortness of breath with ADL's;Weight Management/Obesity   Review Denine has only exercised one session in pulmonary rehab, we will assist her in gaining weight by giving written and verbal information in ways to do so.  We hope to increase her strength and stamina, improve her shortness of breath and develope more efficient breathing techniques with exercise in pulmonary rehab.   In approx 2-3 weeks we will address her home exercise instructions   Expected Outcomes Slow, gradual weight gain along with increased strength and stamina.  Hopefully her SOB will decrease and her ADL's will become easier.      ITP Comments:   Comments:

## 2015-08-22 ENCOUNTER — Encounter: Payer: Self-pay | Admitting: Acute Care

## 2015-08-22 ENCOUNTER — Other Ambulatory Visit (INDEPENDENT_AMBULATORY_CARE_PROVIDER_SITE_OTHER): Payer: Medicare Other

## 2015-08-22 ENCOUNTER — Ambulatory Visit (INDEPENDENT_AMBULATORY_CARE_PROVIDER_SITE_OTHER)
Admission: RE | Admit: 2015-08-22 | Discharge: 2015-08-22 | Disposition: A | Discharge status: Home / Self Care | Payer: Medicare Other | Source: Ambulatory Visit | Attending: Acute Care | Admitting: Acute Care

## 2015-08-22 ENCOUNTER — Ambulatory Visit (INDEPENDENT_AMBULATORY_CARE_PROVIDER_SITE_OTHER): Payer: Medicare Other | Admitting: Acute Care

## 2015-08-22 VITALS — BP 102/68 | HR 88 | Temp 98.0°F | Ht 64.5 in | Wt 96.8 lb

## 2015-08-22 DIAGNOSIS — R042 Hemoptysis: Secondary | ICD-10-CM

## 2015-08-22 DIAGNOSIS — R05 Cough: Secondary | ICD-10-CM

## 2015-08-22 DIAGNOSIS — R059 Cough, unspecified: Secondary | ICD-10-CM

## 2015-08-22 DIAGNOSIS — J189 Pneumonia, unspecified organism: Secondary | ICD-10-CM | POA: Diagnosis not present

## 2015-08-22 DIAGNOSIS — J181 Lobar pneumonia, unspecified organism: Secondary | ICD-10-CM

## 2015-08-22 LAB — BASIC METABOLIC PANEL
BUN: 14 mg/dL (ref 6–23)
CO2: 33 mEq/L — ABNORMAL HIGH (ref 19–32)
Calcium: 10 mg/dL (ref 8.4–10.5)
Chloride: 97 mEq/L (ref 96–112)
Creatinine, Ser: 0.59 mg/dL (ref 0.40–1.20)
GFR: 102.01 mL/min (ref >60.00)
Glucose, Bld: 102 mg/dL — ABNORMAL HIGH (ref 70–99)
POTASSIUM: 4.7 meq/L (ref 3.5–5.1)
SODIUM: 135 meq/L (ref 135–145)

## 2015-08-22 MED ORDER — PREDNISONE 10 MG PO TABS: ORAL_TABLET | ORAL | Status: DC

## 2015-08-22 MED ORDER — RANITIDINE HCL 150 MG PO TABS: 150.0000 mg | ORAL_TABLET | Freq: Every day | ORAL | Status: AC

## 2015-08-22 MED ORDER — HYDROCODONE-HOMATROPINE 5-1.5 MG/5ML PO SYRP: 5.0000 mL | ORAL_SOLUTION | Freq: Every evening | ORAL | Status: DC | PRN

## 2015-08-22 MED ORDER — LEVOFLOXACIN 750 MG PO TABS: 750.0000 mg | ORAL_TABLET | Freq: Every day | ORAL | Status: DC

## 2015-08-22 NOTE — Assessment & Plan Note (Signed)
Slow to resolve bronchiectasis flare with probable early pneumonia in the superior segment of the left lower lobe. Sputum culture August 2016 indicated sputum with sensitivities to Cipro/levofloxacin History of MAI Plan: Chest x-ray (reviewed personally by me) BMET Levaquin 750 mg once daily for 7 days Prednisone taper; 10 mg tablets: 4 tabs x 2 days, 3 tabs x 2 days, 2 tabs x 2 days 1 tab x 2 days then stop. We will send a prescription for hydromet for your cough take only at bedtime.. This will make you sleepy.  Don't drive if sleepy. You can try taking Zantac ( Ranitidine) 150 mg every evening after dinner. Remember if you start coughing up a lot of blood please go to the ED. Follow up appointment in 2 weeks to make sure you are improving. Follow up with Dr. Lake Bells as is scheduled for May 30th, 2017. Please contact office for sooner follow up if symptoms do not improve or worsen or seek emergency care

## 2015-08-22 NOTE — Progress Notes (Signed)
Subjective:    Patient ID: Destiny Rodriguez, female    DOB: [birth year], 82 y.o.   MRN: [id number]  HPI Destiny Rodriguez is a 82 year old with history of bronchiectasis, pulmonary MAC infection with colonization, finished treatment in April 2014.  DATA: CT chest 12/20/12 >Bronchiectasis, pulmonary parenchymal cysts, A collapsed cyst in the left upper lobe with associated presumed pulmonary parenchymal scarring  Spirometry 2010: FEV1 1.26 (80%), ratio 65.  Sputum culture 12/27/14: MAI, Pseudomonas [sensitive to Cipro and Levaquin)  08/22/15: CXR:  IMPRESSION:  Probable early pneumonia in the superior segment of the left lower lobe. There is extensive underlying pulmonary parenchymal scarring with cavities or blebs which appear stable.  Followup PA and lateral chest X-ray is recommended in 3-4 weeks following trial of antibiotic therapy to ensure resolution and exclude underlying malignancy.  08/22/15 Acute office visit: Destiny Rodriguez returns to the office today with continued cough that she states is worse at night, chest tightness, wheezing, and shortness of breath. She was treated for bronchiectasis flare January 2017. She states that Wednesday she started coughing up scant amounts of bright red blood that then cleared and became rust colored. We reviewed the necessity of seeking emergency care at an emergency room for bright red blood of any significant amount. She denies fever, chest pain, She continues to use her resp vest 2 times daily for her bronchiectasis. She continues to use her nighttime oxygen. She is using her Pro Air inhaler as her rescue medication. She has started pulmonary rehabilitation, which she states has been helping as it has taught her purse lipped breathing which she finds helps her shortness of breath. Previous sputum culture done August 2016 indicated sensitivities to both Cipro and Levaquin. She gives history of low-grade fever, but denies chest pain, or  orthopnea.   Current outpatient prescriptions:  .  albuterol (PROVENTIL HFA;VENTOLIN HFA) 108 (90 BASE) MCG/ACT inhaler, Inhale 2 puffs into the lungs every 4 (four) hours as needed for wheezing or shortness of breath., Disp: 1 Inhaler, Rfl: 3 .  albuterol (PROVENTIL) (2.5 MG/3ML) 0.083% nebulizer solution, Take 3 mLs (2.5 mg total) by nebulization every 6 (six) hours as needed for wheezing or shortness of breath., Disp: 360 mL, Rfl: 11 .  Biotin 1000 MCG tablet, Take 1,000 mcg by mouth daily., Disp: , Rfl:  .  budesonide-formoterol (SYMBICORT) 80-4.5 MCG/ACT inhaler, Inhale 2 puffs into the lungs 2 (two) times daily., Disp: 1 Inhaler, Rfl: 12 .  calcium carbonate (OS-CAL) 600 MG TABS, Take 600 mg by mouth 2 (two) times daily with a meal. , Disp: , Rfl:  .  chlorpheniramine (CHLOR-TRIMETON) 4 MG tablet, Take 4 mg by mouth at bedtime., Disp: , Rfl:  .  guaiFENesin (MUCINEX) 600 MG 12 hr tablet, Take 2 tablets (1,200 mg total) by mouth 2 (two) times daily. (Patient taking differently: Take 1,200 mg by mouth 2 (two) times daily as needed. ), Disp: , Rfl:  .  MULTIPLE VITAMIN PO, Take 1 tablet by mouth daily. , Disp: , Rfl:  .  OXYGEN, Inhale into the lungs at bedtime., Disp: , Rfl:  .  HYDROcodone-homatropine (HYCODAN) 5-1.5 MG/5ML syrup, Take 5 mLs by mouth at bedtime as needed for cough., Disp: 180 mL, Rfl: 0 .  levofloxacin (LEVAQUIN) 750 MG tablet, Take 1 tablet (750 mg total) by mouth daily., Disp: 7 tablet, Rfl: 0 .  predniSONE (DELTASONE) 10 MG tablet, Take 4 tabs po x 2 days, then 3 x 2 days, then 2 x 2 days,  then 1 x 2 days then stop., Disp: 20 tablet, Rfl: 0 .  ranitidine (ZANTAC) 150 MG tablet, Take 1 tablet (150 mg total) by mouth daily after supper., Disp: 30 tablet, Rfl: 1   Past Medical History  Diagnosis Date  . Allergic rhinitis   . Hyperlipidemia   . Chronic sinusitis   . Bronchiectasis   . Rheumatism   . MAC (mycobacterium avium-intracellulare complex)   . Migraine      Allergies  Allergen Reactions  . Tequin Rash    Review of Systems Constitutional:   No  weight loss, night sweats, + Fevers, chills, +fatigue, or  lassitude.  HEENT:   No headaches,  Difficulty swallowing,  Tooth/dental problems, or  Sore throat,                No sneezing, itching, ear ache, nasal congestion, post nasal drip,   CV:  No chest pain,  Orthopnea, PND, swelling in lower extremities, anasarca, dizziness, palpitations, syncope.   GI  No heartburn, indigestion, abdominal pain, nausea, vomiting, diarrhea, change in bowel habits, loss of appetite, bloody stools.   Resp: + shortness of breath with exertion and at rest.  + excess mucus, + productive cough,  No non-productive cough,   + coughing up of blood.  + change in color of mucus.  + wheezing.  No chest wall deformity  Skin: no rash or lesions.  GU: no dysuria, change in color of urine, no urgency or frequency.  No flank pain, no hematuria   MS:  No joint pain or swelling.  No decreased range of motion.  No back pain.  Psych:  No change in mood or affect. No depression or anxiety.  No memory loss.        Objective:   Physical Exam BP 102/68 mmHg  Pulse 88  Temp(Src) 98 F (36.7 C) (Oral)  Ht 5' 4.5" (1.638 m)  Wt 96 lb 12.8 oz (43.908 kg)  BMI 16.36 kg/m2  SpO2 95%  Physical Exam:  General- No distress,  A&Ox3, very pleasant elderly female ENT: No sinus tenderness, TM clear, pale nasal mucosa, no oral exudate,no post nasal drip, no LAN Cardiac: S1, S2, regular rate and rhythm, no murmur Chest: + exp wheeze/no  rales/ dullness; no accessory muscle use, no nasal flaring, no sternal retractions Abd.: Soft Non-tender Ext: No clubbing cyanosis, edema Neuro:  normal strength Skin: No rashes, warm and dry Psych: normal mood and behavior  Magdalen Spatz, AGACNP-BC [place] Pager # [phone] 08/22/2015    Assessment & Plan:

## 2015-08-22 NOTE — Patient Instructions (Addendum)
I am sorry you are not feeling well today. We will check another CXR today. We will send in a prescription for Prednisone taper; 10 mg tablets: 4 tabs x 2 days, 3 tabs x 2 days, 2 tabs x 2 days 1 tab x 2 days then stop. Levofloxacin 750 mg once daily for 7 days We will check some labs today to check your kidney function.( BMET) We will send a prescription for hydromet for your cough take only at bedtime.. This will make you sleepy.  Don't drive if sleepy. You can try taking Zantac ( Ranitidine) 150 mg every evening after dinner. Remember if you start coughing up a lot of blood please go to the ED. Follow up appointment in 2 weeks to make sure you are improving. Follow up with Dr. Lake Bells as is scheduled for May 30th, 2017. Please contact office for sooner follow up if symptoms do not improve or worsen or seek emergency care

## 2015-08-25 ENCOUNTER — Telehealth (HOSPITAL_COMMUNITY): Payer: Self-pay | Admitting: *Deleted

## 2015-08-25 NOTE — Progress Notes (Signed)
Reviewed, agree with this plan of care 

## 2015-08-26 ENCOUNTER — Encounter (HOSPITAL_COMMUNITY): Payer: Medicare Other

## 2015-08-28 ENCOUNTER — Encounter (HOSPITAL_COMMUNITY): Payer: Medicare Other

## 2015-09-02 ENCOUNTER — Encounter (HOSPITAL_COMMUNITY)
Admission: RE | Admit: 2015-09-02 | Discharge: 2015-09-02 | Disposition: A | Discharge status: Home / Self Care | Payer: Medicare Other | Source: Ambulatory Visit | Attending: Pulmonary Disease | Admitting: Pulmonary Disease

## 2015-09-02 VITALS — Wt 97.2 lb

## 2015-09-02 DIAGNOSIS — J449 Chronic obstructive pulmonary disease, unspecified: Secondary | ICD-10-CM | POA: Insufficient documentation

## 2015-09-02 DIAGNOSIS — J42 Unspecified chronic bronchitis: Secondary | ICD-10-CM

## 2015-09-02 NOTE — Progress Notes (Signed)
Daily Session Note  Patient Details  Name: Destiny Rodriguez MRN: [id number] Date of Birth: January 04, 1925 Referring Provider:  Juanito Doom, MD  Encounter Date: 09/02/2015  Check In:     Session Check In - 09/02/15 1518    Check-In   Location [facility]   Staff Present Trish Fountain, RN, BSN;Molly diVincenzo, MS, ACSM RCEP, Exercise Physiologist;Camri Molloy Ysidro Evert, RN   Supervising physician immediately available to respond to emergencies [facility] immediately available   Physician(s) Dr. Marily Memos   Medication changes reported     No   Fall or balance concerns reported    No   Warm-up and Cool-down Performed as group-led instruction   Resistance Training Performed Yes   VAD Patient? No   Pain Assessment   Currently in Pain? No/denies   Multiple Pain Sites No      Capillary Blood Glucose: No results found for this or any previous visit (from the past 24 hour(s)).      Exercise Prescription Changes - 09/02/15 1500    Exercise Review   Progression No   Response to Exercise   Blood Pressure (Admit) 116/60 mmHg   Blood Pressure (Exercise) 110/60 mmHg   Blood Pressure (Exit) 94/60 mmHg   Heart Rate (Admit) 93 bpm   Heart Rate (Exercise) 108 bpm   Heart Rate (Exit) 95 bpm   Oxygen Saturation (Admit) 95 %   Oxygen Saturation (Exercise) 93 %   Oxygen Saturation (Exit) 92 %   Rating of Perceived Exertion (Exercise) 13   Perceived Dyspnea (Exercise) 3   Duration Progress to 45 minutes of aerobic exercise without signs/symptoms of physical distress   Progression   Progression Continue to progress workloads to maintain intensity without signs/symptoms of physical distress.   Resistance Training   Training Prescription Yes   Weight orange bands   Reps 10-12   Interval Training   Interval Training No   NuStep   Level 1   Minutes 15   METs 1.3   Arm Ergometer   Level 1   Minutes 15   Track   Laps 11   Minutes 15     Goals Met:  Exercise  tolerated well No report of cardiac concerns or symptoms Strength training completed today  Goals Unmet:  Not Applicable  Comments: Service time is from 1330 to 1510    Dr. Rush Farmer is Medical Director for Pulmonary Rehab at [facility].

## 2015-09-04 ENCOUNTER — Ambulatory Visit (INDEPENDENT_AMBULATORY_CARE_PROVIDER_SITE_OTHER): Payer: Medicare Other | Admitting: Acute Care

## 2015-09-04 ENCOUNTER — Other Ambulatory Visit: Payer: Self-pay | Admitting: Acute Care

## 2015-09-04 ENCOUNTER — Encounter (HOSPITAL_COMMUNITY)
Admission: RE | Admit: 2015-09-04 | Discharge: 2015-09-04 | Disposition: A | Discharge status: Home / Self Care | Payer: Medicare Other | Source: Ambulatory Visit | Attending: Pulmonary Disease | Admitting: Pulmonary Disease

## 2015-09-04 ENCOUNTER — Encounter: Payer: Self-pay | Admitting: Acute Care

## 2015-09-04 ENCOUNTER — Ambulatory Visit (INDEPENDENT_AMBULATORY_CARE_PROVIDER_SITE_OTHER)
Admission: RE | Admit: 2015-09-04 | Discharge: 2015-09-04 | Disposition: A | Discharge status: Home / Self Care | Payer: Medicare Other | Source: Ambulatory Visit | Attending: Acute Care | Admitting: Acute Care

## 2015-09-04 VITALS — BP 102/58 | HR 88 | Ht 64.5 in | Wt 95.2 lb

## 2015-09-04 DIAGNOSIS — J690 Pneumonitis due to inhalation of food and vomit: Secondary | ICD-10-CM

## 2015-09-04 DIAGNOSIS — J189 Pneumonia, unspecified organism: Secondary | ICD-10-CM | POA: Diagnosis not present

## 2015-09-04 DIAGNOSIS — J449 Chronic obstructive pulmonary disease, unspecified: Secondary | ICD-10-CM | POA: Diagnosis not present

## 2015-09-04 DIAGNOSIS — J471 Bronchiectasis with (acute) exacerbation: Secondary | ICD-10-CM

## 2015-09-04 DIAGNOSIS — J181 Lobar pneumonia, unspecified organism: Secondary | ICD-10-CM

## 2015-09-04 DIAGNOSIS — J42 Unspecified chronic bronchitis: Secondary | ICD-10-CM

## 2015-09-04 NOTE — Progress Notes (Signed)
Daily Session Note  Patient Details  Name: Destiny Rodriguez MRN: [id number] Date of Birth: 1925/03/14 Referring Provider:    Encounter Date: 09/04/2015  Check In:     Session Check In - 09/04/15 1355    Check-In   Location [facility]   Staff Present Su Hilt, MS, ACSM RCEP, Exercise Physiologist;Deyon Chizek Rollene Rotunda, RN, Roque Cash, RN   Supervising physician immediately available to respond to emergencies [facility] immediately available   Physician(s) Dr. Aggie Moats   Medication changes reported     No   Fall or balance concerns reported    No   Warm-up and Cool-down Performed as group-led instruction   Resistance Training Performed Yes   VAD Patient? No   Pain Assessment   Currently in Pain? No/denies   Multiple Pain Sites No      Capillary Blood Glucose: No results found for this or any previous visit (from the past 24 hour(s)).      Exercise Prescription Changes - 09/04/15 1610    Exercise Review   Progression No   Response to Exercise   Blood Pressure (Admit) 122/58 mmHg   Blood Pressure (Exercise) 104/60 mmHg   Blood Pressure (Exit) 116/56 mmHg   Heart Rate (Admit) 98 bpm   Heart Rate (Exercise) 106 bpm   Heart Rate (Exit) 93 bpm   Oxygen Saturation (Admit) 95 %   Oxygen Saturation (Exercise) 91 %   Oxygen Saturation (Exit) 93 %   Rating of Perceived Exertion (Exercise) 12   Perceived Dyspnea (Exercise) 2   Duration Progress to 45 minutes of aerobic exercise without signs/symptoms of physical distress   Progression   Progression Continue to progress workloads to maintain intensity without signs/symptoms of physical distress.   Resistance Training   Training Prescription Yes   Weight orange bands   Reps 10-12   Interval Training   Interval Training No   NuStep   Level 1   Minutes 15   METs 1.6   Arm Ergometer   Level 1   Minutes 15   Track   Laps 11   Minutes 15     Goals Met:  Using PLB without cueing & demonstrates  good technique Exercise tolerated well No report of cardiac concerns or symptoms Strength training completed today  Goals Unmet:  Not Applicable  Comments: Service time is from 1330 to 1530   Dr. Rush Farmer is Medical Director for Pulmonary Rehab at [facility].

## 2015-09-04 NOTE — Progress Notes (Signed)
Subjective:    Patient ID: Destiny Rodriguez, female    DOB: 10-Dec-1924, 82 y.o.   MRN: [id number]  HPI  HPI Destiny Rodriguez is a 82 year old with history of bronchiectasis, pulmonary MAC infection with colonization, finished treatment in April 2014.  DATA: CT chest 12/20/12 >Bronchiectasis, pulmonary parenchymal cysts, A collapsed cyst in the left upper lobe with associated presumed pulmonary parenchymal scarring  Spirometry 2010: FEV1 1.26 (71%), ratio 65.  Sputum culture 12/27/14: MAI, Pseudomonas [sensitive to Cipro and Levaquin)  08/22/15: CXR:  IMPRESSION:  Probable early pneumonia in the superior segment of the left lower lobe. There is extensive underlying pulmonary parenchymal scarring with cavities or blebs which appear stable.  Followup PA and lateral chest X-ray is recommended in 3-4 weeks following trial of antibiotic therapy to ensure resolution and exclude underlying malignancy.  09/04/2015:  CXR: Chronic bilateral interstitial lung disease and cavitation. Persistent mild left mid lung field infiltrate noted. Exam is unchanged from prior exam .  09/04/2015: Follow Up Office Visit: Pt. Returns for follow up of a slow to resolve bronchiectasis flare/  LLL pneumonia that was treated with a prednisone taper and Levaquin 750 daily for 7 days.She states she is better. Denies fever or purulent sputum. States she still has a slight non-productive cough. She denies chest pain,orthopnea, hemoptysis, or leg or calf pain. She is increasing her activity gradually.She remains on her oxygen at 2L pulsed with a saturation of 93%>>chest X-ray today does not indicate improvement of infiltrate, although patient has no clinical manifestations/ s/s  of infection. Discussed with Dr. Halford Chessman. We will have patient return in 2 weeks for repeat CXR to confirm resolution.We discussed that she needs to come to the office sooner for fever or change in volume  or color of secretions, or demise in overall  condition. She verbalized understanding of this.She is compliant with her Symbicort and her proventil neb treatments/ rescue inhaler.     Current outpatient prescriptions:  .  albuterol (PROVENTIL HFA;VENTOLIN HFA) 108 (90 BASE) MCG/ACT inhaler, Inhale 2 puffs into the lungs every 4 (four) hours as needed for wheezing or shortness of breath., Disp: 1 Inhaler, Rfl: 3 .  albuterol (PROVENTIL) (2.5 MG/3ML) 0.083% nebulizer solution, Take 3 mLs (2.5 mg total) by nebulization every 6 (six) hours as needed for wheezing or shortness of breath., Disp: 360 mL, Rfl: 11 .  Biotin 1000 MCG tablet, Take 1,000 mcg by mouth daily., Disp: , Rfl:  .  budesonide-formoterol (SYMBICORT) 80-4.5 MCG/ACT inhaler, Inhale 2 puffs into the lungs 2 (two) times daily., Disp: 1 Inhaler, Rfl: 12 .  calcium carbonate (OS-CAL) 600 MG TABS, Take 600 mg by mouth 2 (two) times daily with a meal. , Disp: , Rfl:  .  chlorpheniramine (CHLOR-TRIMETON) 4 MG tablet, Take 4 mg by mouth at bedtime., Disp: , Rfl:  .  guaiFENesin (MUCINEX) 600 MG 12 hr tablet, Take 2 tablets (1,200 mg total) by mouth 2 (two) times daily. (Patient taking differently: Take 1,200 mg by mouth 2 (two) times daily as needed. ), Disp: , Rfl:  .  HYDROcodone-homatropine (HYCODAN) 5-1.5 MG/5ML syrup, Take 5 mLs by mouth at bedtime as needed for cough., Disp: 180 mL, Rfl: 0 .  levofloxacin (LEVAQUIN) 750 MG tablet, Take 1 tablet (750 mg total) by mouth daily., Disp: 7 tablet, Rfl: 0 .  MULTIPLE VITAMIN PO, Take 1 tablet by mouth daily. , Disp: , Rfl:  .  OXYGEN, Inhale into the lungs at bedtime., Disp: , Rfl:  .  predniSONE (DELTASONE) 10 MG tablet, Take 4 tabs po x 2 days, then 3 x 2 days, then 2 x 2 days, then 1 x 2 days then stop., Disp: 20 tablet, Rfl: 0 .  ranitidine (ZANTAC) 150 MG tablet, Take 1 tablet (150 mg total) by mouth daily after supper., Disp: 30 tablet, Rfl: 1   Past Medical History  Diagnosis Date  . Allergic rhinitis   . Hyperlipidemia   .  Chronic sinusitis   . Bronchiectasis   . Rheumatism   . MAC (mycobacterium avium-intracellulare complex)   . Migraine     Allergies  Allergen Reactions  . Tequin Rash    Review of Systems Constitutional:   No  weight loss, night sweats,  Fevers, chills, fatigue, or  lassitude.  HEENT:   No headaches,  Difficulty swallowing,  Tooth/dental problems, or  Sore throat,                No sneezing, itching, ear ache, nasal congestion, post nasal drip,   CV:  No chest pain,  Orthopnea, PND, swelling in lower extremities, anasarca, dizziness, palpitations, syncope.   GI  No heartburn, indigestion, abdominal pain, nausea, vomiting, diarrhea, change in bowel habits, loss of appetite, bloody stools.   Resp: + shortness of breath with exertion not  at rest.  No excess mucus, no productive cough,  + non-productive cough,  No coughing up of blood.  No change in color of mucus.  No wheezing.  No chest wall deformity  Skin: no rash or lesions.  GU: no dysuria, change in color of urine, no urgency or frequency.  No flank pain, no hematuria   MS:  No joint pain or swelling.  No decreased range of motion.  No back pain.  Psych:  No change in mood or affect. No depression or anxiety.  No memory loss.      Objective:   Physical Exam BP 102/58 mmHg  Pulse 88  Ht 5' 4.5" (1.638 m)  Wt 95 lb 3.2 oz (43.182 kg)  BMI 16.09 kg/m2  SpO2 93%   Last BP in office 08/22/2015 : 102/68; suspect this is patient's baseline.  Physical Exam:  General- No distress,  A&Ox3 ENT: No sinus tenderness, TM clear, pale nasal mucosa, no oral exudate,no post nasal drip, no LAN Cardiac: S1, S2, regular rate and rhythm, no murmur Chest: No wheeze/ rales/ dullness; no accessory muscle use, no nasal flaring, no sternal retractions Abd.: Soft Non-tender Ext: No clubbing cyanosis, edema Neuro:  normal strength Skin: No rashes, warm and dry Psych: normal mood and behavior Destiny Rodriguez, AGACNP-BC [facility] 09/04/2015    Assessment & Plan:

## 2015-09-04 NOTE — Assessment & Plan Note (Signed)
Clinical resolution of s/s pneumonia Imaging shows slow to resolve pneumonia. Plan: You look clinically better today. Your chest xray has not totally cleared. Please come in back 2 weeks for a follow up chest xray. Continue your Zantac as it is providing you with relief. Call sooner if you develop a fever or have a change in your sputum color so we can schedule an appointment. Rest and hydrate Please follow up with Dr. Lake Bells in May as scheduled. Please contact office for sooner follow up if symptoms do not improve or worsen or seek emergency care

## 2015-09-04 NOTE — Patient Instructions (Addendum)
It is nice to see you again. I am glad you are feeling better. You look clinically better today. Your chest xray has not totally cleared. Please come in in 2 weeks for a follow up chest xray. Continue your Zantac as it is providing you with relief. Call sooner if you develop a fever or have a change in your sputum color so we can schedule an appointment. Rest and hydrate Advance activity gradually Please follow up with Dr. Lake Bells in May as scheduled. Please contact office for sooner follow up if symptoms do not improve or worsen or seek emergency care

## 2015-09-07 NOTE — Progress Notes (Signed)
Reviewed, agree 

## 2015-09-09 ENCOUNTER — Encounter (HOSPITAL_COMMUNITY)
Admission: RE | Admit: 2015-09-09 | Discharge: 2015-09-09 | Disposition: A | Discharge status: Home / Self Care | Payer: Medicare Other | Source: Ambulatory Visit | Attending: Pulmonary Disease | Admitting: Pulmonary Disease

## 2015-09-09 VITALS — Wt 97.2 lb

## 2015-09-09 DIAGNOSIS — J42 Unspecified chronic bronchitis: Secondary | ICD-10-CM

## 2015-09-09 DIAGNOSIS — J449 Chronic obstructive pulmonary disease, unspecified: Secondary | ICD-10-CM | POA: Diagnosis not present

## 2015-09-09 NOTE — Progress Notes (Signed)
Daily Session Note  Patient Details  Name: Destiny Rodriguez MRN: [id number] Date of Birth: 07-Jul-1924 Referring Provider:    Encounter Date: 09/09/2015  Check In:     Session Check In - 09/09/15 1423    Check-In   Location [facility]   Staff Present Rosebud Poles, RN, BSN;Lakena Sparlin, MS, ACSM RCEP, Exercise Physiologist;Lisa Ysidro Evert, RN   Supervising physician immediately available to respond to emergencies [facility] immediately available   Physician(s) Dr. Judson Roch   Medication changes reported     No   Fall or balance concerns reported    No   Warm-up and Cool-down Performed as group-led instruction   Resistance Training Performed Yes   VAD Patient? No   Pain Assessment   Currently in Pain? No/denies   Multiple Pain Sites No      Capillary Blood Glucose: No results found for this or any previous visit (from the past 24 hour(s)).      Exercise Prescription Changes - 09/09/15 1500    Response to Exercise   Blood Pressure (Admit) 100/54 mmHg   Blood Pressure (Exercise) 122/56 mmHg   Blood Pressure (Exit) 102/52 mmHg   Heart Rate (Admit) 95 bpm   Heart Rate (Exercise) 103 bpm   Heart Rate (Exit) 89 bpm   Oxygen Saturation (Admit) 92 %   Oxygen Saturation (Exercise) 92 %   Oxygen Saturation (Exit) 94 %   Rating of Perceived Exertion (Exercise) 13   Perceived Dyspnea (Exercise) 1   Duration Progress to 45 minutes of aerobic exercise without signs/symptoms of physical distress   Progression   Progression Continue to progress workloads to maintain intensity without signs/symptoms of physical distress.   Resistance Training   Training Prescription Yes   Weight orange bands   Reps 10-12   Interval Training   Interval Training No   NuStep   Level 1   Minutes 15   METs 1.6   Arm Ergometer   Level 1   Minutes 15   Track   Laps 10   Minutes 15     Goals Met:  Exercise tolerated well No report of cardiac concerns or  symptoms Strength training completed today  Goals Unmet:  Not Applicable  Comments: Service time is from 1:30pm to 3:00pm    Dr. Rush Farmer is Medical Director for Pulmonary Rehab at [facility].

## 2015-09-09 NOTE — Progress Notes (Signed)
Pulmonary Individual Treatment Plan  Patient Details  Name: Destiny Rodriguez MRN: [id number] Date of Birth: [birth year] Referring Provider:    Initial Encounter Date:       Pulmonary Rehab Walk Test from 08/05/2015 in [place]   Date  08/05/15      Visit Diagnosis: Chronic bronchitis, unspecified chronic bronchitis type ([place])  Patient's Home Medications on Admission:   Current outpatient prescriptions:  .  albuterol (PROVENTIL HFA;VENTOLIN HFA) 108 (90 BASE) MCG/ACT inhaler, Inhale 2 puffs into the lungs every 4 (four) hours as needed for wheezing or shortness of breath., Disp: 1 Inhaler, Rfl: 3 .  albuterol (PROVENTIL) (2.5 MG/3ML) 0.083% nebulizer solution, Take 3 mLs (2.5 mg total) by nebulization every 6 (six) hours as needed for wheezing or shortness of breath., Disp: 360 mL, Rfl: 11 .  Biotin 1000 MCG tablet, Take 1,000 mcg by mouth daily., Disp: , Rfl:  .  budesonide-formoterol (SYMBICORT) 80-4.5 MCG/ACT inhaler, Inhale 2 puffs into the lungs 2 (two) times daily., Disp: 1 Inhaler, Rfl: 12 .  calcium carbonate (OS-CAL) 600 MG TABS, Take 600 mg by mouth 2 (two) times daily with a meal. , Disp: , Rfl:  .  chlorpheniramine (CHLOR-TRIMETON) 4 MG tablet, Take 4 mg by mouth at bedtime., Disp: , Rfl:  .  guaiFENesin (MUCINEX) 600 MG 12 hr tablet, Take 2 tablets (1,200 mg total) by mouth 2 (two) times daily. (Patient taking differently: Take 1,200 mg by mouth 2 (two) times daily as needed. ), Disp: , Rfl:  .  HYDROcodone-homatropine (HYCODAN) 5-1.5 MG/5ML syrup, Take 5 mLs by mouth at bedtime as needed for cough. (Patient not taking: Reported on 09/04/2015), Disp: 180 mL, Rfl: 0 .  Liniments (SALONPAS PAIN RELIEF PATCH EX), Apply topically as needed., Disp: , Rfl:  .  MULTIPLE VITAMIN PO, Take 1 tablet by mouth daily. , Disp: , Rfl:  .  OXYGEN, Inhale into the lungs at bedtime., Disp: , Rfl:  .  ranitidine (ZANTAC) 150 MG tablet, Take 1 tablet (150 mg total)  by mouth daily after supper., Disp: 30 tablet, Rfl: 1  Past Medical History: Past Medical History  Diagnosis Date  . Allergic rhinitis   . Hyperlipidemia   . Chronic sinusitis   . Bronchiectasis   . Rheumatism   . MAC (mycobacterium avium-intracellulare complex)   . Migraine     Tobacco Use: History  Smoking status  . Never Smoker   Smokeless tobacco  . Never Used    Labs:     Recent Review Flowsheet Data    There is no flowsheet data to display.      Capillary Blood Glucose: No results found for: GLUCAP   ADL UCSD:     Pulmonary Assessment Scores      08/07/15 1515       ADL UCSD   SOB Score total 48        Pulmonary Function Assessment:     Pulmonary Function Assessment - 08/04/15 1435    Breath   Bilateral Breath Sounds Rhonchi;Rales  bases bilat   Shortness of Breath Yes      Exercise Target Goals:    Exercise Program Goal: Individual exercise prescription set with THRR, safety & activity barriers. Participant demonstrates ability to understand and report RPE using BORG scale, to self-measure pulse accurately, and to acknowledge the importance of the exercise prescription.  Exercise Prescription Goal: Starting with aerobic activity 30 plus minutes a day, 3 days per week for initial  exercise prescription. Provide home exercise prescription and guidelines that participant acknowledges understanding prior to discharge.  Activity Barriers & Risk Stratification:     Activity Barriers & Cardiac Risk Stratification - 08/04/15 1433    Activity Barriers & Cardiac Risk Stratification   Activity Barriers Shortness of Breath;Deconditioning;Back Problems  back pain limits her gardening      6 Minute Walk:     6 Minute Walk      08/05/15 1607       6 Minute Walk   Phase Initial     Distance 1011 feet     Walk Time 6 minutes     # of Rest Breaks 0     MPH 1.91     METS 2.47     RPE 12     Perceived Dyspnea  1     VO2 Peak 7.03      Symptoms No     Resting HR 99 bpm     Resting BP 112/70 mmHg     Max Ex. HR 112 bpm     Max Ex. BP 120/72 mmHg     2 Minute Post BP 112/60 mmHg     Interval HR   Baseline HR 99     1 Minute HR 112     2 Minute HR 108     3 Minute HR 107     4 Minute HR 106     5 Minute HR 108     6 Minute HR 110     2 Minute Post HR 103     Interval Heart Rate? Yes     Interval Oxygen   Interval Oxygen? --  room air     Pre/Post BP   Baseline BP 112/70 mmHg     6 Minute BP 120/72 mmHg     Pre/Post BP? Yes        Initial Exercise Prescription:     Initial Exercise Prescription - 08/06/15 0800    Date of Initial Exercise RX and Referring Provider   Date 08/05/15   Oxygen   Oxygen --  room air   Bike   Level 0.2   Minutes 15   METs 1.84   NuStep   Level 1   Minutes 15   METs 2   Track   Laps 9   Minutes 15   METs 2.04   Prescription Details   Frequency (times per week) 2   Duration Progress to 45 minutes of aerobic exercise without signs/symptoms of physical distress   Intensity   THRR 40-80% of Max Heartrate 53-106   Ratings of Perceived Exertion 11-13   Perceived Dyspnea 0-4   Resistance Training   Training Prescription Yes   Weight orange bands   Reps 10-12      Perform Capillary Blood Glucose checks as needed.  Exercise Prescription Changes:      Exercise Prescription Changes      08/19/15 1600 09/02/15 1500 09/04/15 1610 09/09/15 1500     Exercise Review   Progression No No No     Response to Exercise   Blood Pressure (Admit) 132/60 mmHg 116/60 mmHg 122/58 mmHg 100/54 mmHg    Blood Pressure (Exercise) 122/70 mmHg 110/60 mmHg 104/60 mmHg 122/56 mmHg    Blood Pressure (Exit) 134/74 mmHg 94/60 mmHg 116/56 mmHg 102/52 mmHg    Heart Rate (Admit) 96 bpm 93 bpm 98 bpm 95 bpm    Heart Rate (Exercise) 107 bpm 108 bpm 106 bpm 103 bpm  Heart Rate (Exit) 104 bpm 95 bpm 93 bpm 89 bpm    Oxygen Saturation (Admit) 94 % 95 % 95 % 92 %    Oxygen Saturation (Exercise)  92 % 93 % 91 % 92 %    Oxygen Saturation (Exit) 94 % 92 % 93 % 94 %    Rating of Perceived Exertion (Exercise) _0 Perceived Dyspnea (Exercise) _1 Symptoms left hip pain with biking, tried airdyne and recumbent bike       Comments will try other equipment to not aggravate hip pain       Duration Progress to 45 minutes of aerobic exercise without signs/symptoms of physical distress Progress to 45 minutes of aerobic exercise without signs/symptoms of physical distress Progress to 45 minutes of aerobic exercise without signs/symptoms of physical distress Progress to 45 minutes of aerobic exercise without signs/symptoms of physical distress    Intensity Other (comment)  40-80% HRR       Progression   Progression Continue to progress workloads to maintain intensity without signs/symptoms of physical distress. Continue to progress workloads to maintain intensity without signs/symptoms of physical distress. Continue to progress workloads to maintain intensity without signs/symptoms of physical distress. Continue to progress workloads to maintain intensity without signs/symptoms of physical distress.    Resistance Training   Training Prescription Yes Yes Yes Yes    Weight orange bands orange bands orange bands orange bands    Reps 10-12 10-12 10-12 10-12    Interval Training   Interval Training No No No No    Bike   Level 0.2       Minutes 15       NuStep   Level _2 Minutes _3 METs  1.3 1.6 1.6    Arm Ergometer   Level _4 Minutes _5 Track   Laps _6 Minutes _7 Exercise Comments:      Exercise Comments      09/11/15 0845           Exercise Comments Patient is cont. to progress exercise tolerance. Will continue to monitor progression.          Discharge Exercise Prescription (Final Exercise Prescription Changes):     Exercise Prescription Changes - 09/09/15 1500    Response to Exercise   Blood  Pressure (Admit) 100/54 mmHg   Blood Pressure (Exercise) 122/56 mmHg   Blood Pressure (Exit) 102/52 mmHg   Heart Rate (Admit) 95 bpm   Heart Rate (Exercise) 103 bpm   Heart Rate (Exit) 89 bpm   Oxygen Saturation (Admit) 92 %   Oxygen Saturation (Exercise) 92 %   Oxygen Saturation (Exit) 94 %   Rating of Perceived Exertion (Exercise) 13   Perceived Dyspnea (Exercise) 1   Duration Progress to 45 minutes of aerobic exercise without signs/symptoms of physical distress   Progression   Progression Continue to progress workloads to maintain intensity without signs/symptoms of physical distress.   Resistance Training   Training Prescription Yes   Weight orange bands   Reps 10-12   Interval Training   Interval Training No   NuStep   Level 1   Minutes 15   METs 1.6   Arm Ergometer   Level 1  Minutes 15   Track   Laps 10   Minutes 15       Nutrition:  Target Goals: Understanding of nutrition guidelines, daily intake of sodium <1546m, cholesterol <2086m calories 30% from fat and 7% or less from saturated fats, daily to have 5 or more servings of fruits and vegetables.  Biometrics:     Pre Biometrics - 08/04/15 1414    Pre Biometrics   Grip Strength 23 kg       Nutrition Therapy Plan and Nutrition Goals:     Nutrition Therapy & Goals - 08/14/15 1528    Nutrition Therapy   Diet High Calorie, High Protein   Intervention Plan   Intervention Prescribe, educate and counsel regarding individualized specific dietary modifications aiming towards targeted core components such as weight, hypertension, lipid management, diabetes, heart failure and other comorbidities.   Expected Outcomes Short Term Goal: Understand basic principles of dietary content, such as calories, fat, sodium, cholesterol and nutrients.;Long Term Goal: Adherence to prescribed nutrition plan.      Nutrition Discharge: Rate Your Plate Scores:     Nutrition Assessments - 08/14/15 1529    Rate Your Plate  Scores   Pre Score 63      Psychosocial: Target Goals: Acknowledge presence or absence of depression, maximize coping skills, provide positive support system. Participant is able to verbalize types and ability to use techniques and skills needed for reducing stress and depression.  Initial Review & Psychosocial Screening:     Initial Psych Review & Screening - 08/04/15 [id number]   Barriers   Psychosocial barriers to participate in program There are no identifiable barriers or psychosocial needs.;The patient should benefit from training in stress management and relaxation.   Screening Interventions   Interventions Encouraged to exercise      Quality of Life Scores:     Quality of Life - 08/07/15 1515    Quality of Life Scores   Health/Function Pre 20.6 %   Socioeconomic Pre 22.56 %   Psych/Spiritual Pre 22.86 %   Family Pre 24 %   GLOBAL Pre 21.88 %      PHQ-9:     Recent Review Flowsheet Data    [facility] 08/04/2015 03/06/2015 07/25/2014 04/25/2014 01/23/2013   Decreased Interest 1 0 0 0 0   Down, Depressed, Hopeless 0 0 0 0 0   PHQ - 2 Score 1 0 0 0 0      Psychosocial Evaluation and Intervention:   Psychosocial Re-Evaluation:     Psychosocial Re-Evaluation      08/21/15 1618           Psychosocial Re-Evaluation   Interventions Encouraged to attend Pulmonary Rehabilitation for the exercise       Comments No psychosocial issues are identified at this time, encouraged to exercise in pulmonary rehab.       Continued Psychosocial Services Needed Yes  reevaluate in 30 days for changes in psychosocial needs         Education: Education Goals: Education classes will be provided on a weekly basis, covering required topics. Participant will state understanding/return demonstration of topics presented.  Learning Barriers/Preferences:     Learning Barriers/Preferences - 08/04/15 1434    Learning  Barriers/Preferences   Learning Barriers None   Learning Preferences Individual Instruction      Education Topics: Risk Factor Reduction:  -Group instruction that is supported by a PowerPoint presentation. Instructor discusses  the definition of a risk factor, different risk factors for pulmonary disease, and how the heart and lungs work together.     Nutrition for Pulmonary Patient:  -Group instruction provided by PowerPoint slides, verbal discussion, and written materials to support subject matter. The instructor gives an explanation and review of healthy diet recommendations, which includes a discussion on weight management, recommendations for fruit and vegetable consumption, as well as protein, fluid, caffeine, fiber, sodium, sugar, and alcohol. Tips for eating when patients are short of breath are discussed.   Pursed Lip Breathing:  -Group instruction that is supported by demonstration and informational handouts. Instructor discusses the benefits of pursed lip and diaphragmatic breathing and detailed demonstration on how to preform both.        PULMONARY REHAB OTHER RESPIRATORY from 09/04/2015 in [place]   Date  09/04/15   Educator  RT   Instruction Review Code  2- meets goals/outcomes      Oxygen Safety:  -Group instruction provided by PowerPoint, verbal discussion, and written material to support subject matter. There is an overview of "What is Oxygen" and "Why do we need it".  Instructor also reviews how to create a safe environment for oxygen use, the importance of using oxygen as prescribed, and the risks of noncompliance. There is a brief discussion on traveling with oxygen and resources the patient may utilize.   Oxygen Equipment:  -Group instruction provided by [facility] Staff utilizing handouts, written materials, and equipment demonstrations.   Signs and Symptoms:  -Group instruction provided by written material and verbal discussion  to support subject matter. Warning signs and symptoms of infection, stroke, and heart attack are reviewed and when to call the physician/911 reinforced. Tips for preventing the spread of infection discussed.   Advanced Directives:  -Group instruction provided by verbal instruction and written material to support subject matter. Instructor reviews Advanced Directive laws and proper instruction for filling out document.   Pulmonary Video:  -Group video education that reviews the importance of medication and oxygen compliance, exercise, good nutrition, pulmonary hygiene, and pursed lip and diaphragmatic breathing for the pulmonary patient.   Exercise for the Pulmonary Patient:  -Group instruction that is supported by a PowerPoint presentation. Instructor discusses benefits of exercise, core components of exercise, frequency, duration, and intensity of an exercise routine, importance of utilizing pulse oximetry during exercise, safety while exercising, and options of places to exercise outside of rehab.     Pulmonary Medications:  -Verbally interactive group education provided by instructor with focus on inhaled medications and proper administration.          PULMONARY REHAB OTHER RESPIRATORY from 09/04/2015 in [place]   Educator  -- [facility] Bessie/ Pharmacist]   Instruction Review Code  2- meets goals/outcomes      Anatomy and Physiology of the Respiratory System and Intimacy:  -Group instruction provided by PowerPoint, verbal discussion, and written material to support subject matter. Instructor reviews respiratory cycle and anatomical components of the respiratory system and their functions. Instructor also reviews differences in obstructive and restrictive respiratory diseases with examples of each. Intimacy, Sex, and Sexuality differences are reviewed with a discussion on how relationships can change when diagnosed with pulmonary disease. Common sexual  concerns are reviewed.   Knowledge Questionnaire Score:     Knowledge Questionnaire Score - 08/07/15 1514    Knowledge Questionnaire Score   Pre Score 10/13      Core Components/Risk Factors/Patient Goals at  Admission:     Personal Goals and Risk Factors at Admission - 08/21/15 1623    Core Components/Risk Factors/Patient Goals on Admission   Personal Goal Other Yes      Core Components/Risk Factors/Patient Goals Review:      Goals and Risk Factor Review      08/21/15 1625           Core Components/Risk Factors/Patient Goals Review   Personal Goals Review Increase Strength and Stamina;Develop more efficient breathing techniques such as purse lipped breathing and diaphragmatic breathing and practicing self-pacing with activity.;Improve shortness of breath with ADL's;Weight Management/Obesity       Review Joleene has only exercised one session in pulmonary rehab, we will assist her in gaining weight by giving written and verbal information in ways to do so.  We hope to increase her strength and stamina, improve her shortness of breath and develope more efficient breathing techniques with exercise in pulmonary rehab.   In approx 2-3 weeks we will address her home exercise instructions       Expected Outcomes Slow, gradual weight gain along with increased strength and stamina.  Hopefully her SOB will decrease and her ADL's will become easier.          Core Components/Risk Factors/Patient Goals at Discharge (Final Review):      Goals and Risk Factor Review - 08/21/15 1625    Core Components/Risk Factors/Patient Goals Review   Personal Goals Review Increase Strength and Stamina;Develop more efficient breathing techniques such as purse lipped breathing and diaphragmatic breathing and practicing self-pacing with activity.;Improve shortness of breath with ADL's;Weight Management/Obesity   Review Delva has only exercised one session in pulmonary rehab, we will assist her in  gaining weight by giving written and verbal information in ways to do so.  We hope to increase her strength and stamina, improve her shortness of breath and develope more efficient breathing techniques with exercise in pulmonary rehab.   In approx 2-3 weeks we will address her home exercise instructions   Expected Outcomes Slow, gradual weight gain along with increased strength and stamina.  Hopefully her SOB will decrease and her ADL's will become easier.      ITP Comments:   Comments: ITP REVIEW Pt is making expected progress toward personal goals after completing 4sessions.   Recommend continued exercise, life style modification, education, and utilization of breathing texhniques to increase stamina and strength and decrease shortness of breath with exertion.  She has already noticed that she feels better mentally in that she is hopeful that pulmonary rehab will improve her strength and stamina.

## 2015-09-11 ENCOUNTER — Encounter (HOSPITAL_COMMUNITY): Payer: Medicare Other

## 2015-09-16 ENCOUNTER — Encounter (HOSPITAL_COMMUNITY)
Admission: RE | Admit: 2015-09-16 | Discharge: 2015-09-16 | Disposition: A | Discharge status: Home / Self Care | Payer: Medicare Other | Source: Ambulatory Visit | Attending: Pulmonary Disease | Admitting: Pulmonary Disease

## 2015-09-16 VITALS — Wt 98.3 lb

## 2015-09-16 DIAGNOSIS — J42 Unspecified chronic bronchitis: Secondary | ICD-10-CM

## 2015-09-16 DIAGNOSIS — J449 Chronic obstructive pulmonary disease, unspecified: Secondary | ICD-10-CM | POA: Diagnosis not present

## 2015-09-16 NOTE — Progress Notes (Signed)
Daily Session Note  Patient Details  Name: Destiny Rodriguez MRN: [id number] Date of Birth: 11-22-1924 Referring Provider:    Encounter Date: 09/16/2015  Check In:     Session Check In - 09/16/15 1520    Check-In   Location [facility]   Staff Present Rosebud Poles, RN, BSN;Molly diVincenzo, MS, ACSM RCEP, Exercise Physiologist;Portia Rollene Rotunda, RN, BSN   Supervising physician immediately available to respond to emergencies [facility] immediately available   Physician(s) Dr. Marily Memos   Medication changes reported     No   Fall or balance concerns reported    No   Warm-up and Cool-down Performed as group-led instruction   Resistance Training Performed Yes   VAD Patient? No   Pain Assessment   Currently in Pain? No/denies   Multiple Pain Sites No      Capillary Blood Glucose: No results found for this or any previous visit (from the past 24 hour(s)).      Exercise Prescription Changes - 09/16/15 1500    Response to Exercise   Blood Pressure (Admit) 110/52 mmHg   Blood Pressure (Exercise) 122/80 mmHg   Blood Pressure (Exit) 108/60 mmHg   Heart Rate (Admit) 92 bpm   Heart Rate (Exercise) 107 bpm   Heart Rate (Exit) 86 bpm   Oxygen Saturation (Admit) 93 %   Oxygen Saturation (Exercise) 90 %   Oxygen Saturation (Exit) 95 %   Rating of Perceived Exertion (Exercise) 13   Perceived Dyspnea (Exercise) 2   Duration Progress to 45 minutes of aerobic exercise without signs/symptoms of physical distress   Intensity THRR unchanged   Progression   Progression Continue to progress workloads to maintain intensity without signs/symptoms of physical distress.   Resistance Training   Training Prescription Yes   Weight orange bands   Reps 10-12   Interval Training   Interval Training No   Arm Ergometer   Level 1   Minutes 15   Track   Laps 10   Minutes 15     Goals Met:  Exercise tolerated well No report of cardiac concerns or symptoms Strength training  completed today  Goals Unmet:  Not Applicable  Comments: Service time is from 1330 to 1455    Dr. Rush Farmer is Medical Director for Pulmonary Rehab at [facility].

## 2015-09-16 NOTE — Progress Notes (Signed)
Destiny Rodriguez 82 y.o. female Nutrition Note Spoke with pt. Pt is underweight. Per discussion, pt states her MD "wants me to weigh at least 105 lb." Pt reports she has been working toward gaining wt over the past month "and I haven't gained anything." Pt eats 3 meals daily and does not snack "because I don't feel hungry." Pt's Rate Your Plate results not reviewed with pt due to pt's need to gain wt. Pt reports she has been following a low-fat diet "for years for health and because I have a hiatal hernia." Per discussion, pt avoids fried food and eating a lot at night because of her hiatal hernia. Pt educated re: high calorie, high protein diet. Pt expressed understanding of the information reviewed via feedback method.    No results found for: HGBA1C  Nutrition Diagnosis ? Increased energy expenditure related to increased energy requirements during recent illness as evidenced by BMI <20 and recent h/o wt loss. ?  Nutrition Rx/Est. Daily Nutrition Needs for: ? wt gain 1800-2300 Kcal  70-80 gm protein   1500 mg or less sodium       Nutrition Intervention ? Pt's individual nutrition plan and goals reviewed with pt. ? Benefits of adopting healthy eating habits discussed when pt's Rate Your Plate reviewed. ? Handouts given for High Calorie, High Protein diet & recipes; Ways to increase kcal and protein;  ? Pt given information re: appetite stimulants (e.g. Remeron, Marinol, Eldertonic, Megace) to discuss with Dr. Graylon Good ? Pt to attend the Nutrition and Lung Disease class ? Continual client-centered nutrition education by RD, as part of interdisciplinary care.  Goal(s) 1. The pt will recognize symptoms that can interfere with adequate oral intake, such as shortness of breath, N/V, early satiety, fatigue, ability to secure and prepare food, taste and smell changes, chewing/swallowing difficulties, and/ or pain when eating. 2. The pt will consume high-energy, high-nutrient dense beverages when  necessary to compensate for decreased oral intake of solid foods. 3. Identify food quantities necessary to achieve wt gain of  -2# per week to a goal wt of 100-105 lb at graduation from pulmonary rehab. Monitor and Evaluate progress toward nutrition goal with team.   Derek Mound, M.Ed, RD, LDN, CDE 09/16/2015 3:17 PM

## 2015-09-18 ENCOUNTER — Encounter (HOSPITAL_COMMUNITY)
Admission: RE | Admit: 2015-09-18 | Discharge: 2015-09-18 | Disposition: A | Discharge status: Home / Self Care | Payer: Medicare Other | Source: Ambulatory Visit | Attending: Pulmonary Disease | Admitting: Pulmonary Disease

## 2015-09-18 ENCOUNTER — Ambulatory Visit (INDEPENDENT_AMBULATORY_CARE_PROVIDER_SITE_OTHER): Payer: Medicare Other | Admitting: Internal Medicine

## 2015-09-18 ENCOUNTER — Ambulatory Visit (INDEPENDENT_AMBULATORY_CARE_PROVIDER_SITE_OTHER)
Admission: RE | Admit: 2015-09-18 | Discharge: 2015-09-18 | Disposition: A | Discharge status: Home / Self Care | Payer: Medicare Other | Source: Ambulatory Visit | Attending: Acute Care | Admitting: Acute Care

## 2015-09-18 ENCOUNTER — Encounter: Payer: Self-pay | Admitting: Internal Medicine

## 2015-09-18 VITALS — BP 146/78 | HR 81 | Temp 98.0°F | Wt 97.0 lb

## 2015-09-18 VITALS — Wt 97.9 lb

## 2015-09-18 DIAGNOSIS — J690 Pneumonitis due to inhalation of food and vomit: Secondary | ICD-10-CM

## 2015-09-18 DIAGNOSIS — J42 Unspecified chronic bronchitis: Secondary | ICD-10-CM

## 2015-09-18 DIAGNOSIS — J449 Chronic obstructive pulmonary disease, unspecified: Secondary | ICD-10-CM | POA: Diagnosis not present

## 2015-09-18 DIAGNOSIS — J189 Pneumonia, unspecified organism: Secondary | ICD-10-CM | POA: Diagnosis not present

## 2015-09-18 DIAGNOSIS — J479 Bronchiectasis, uncomplicated: Secondary | ICD-10-CM

## 2015-09-18 DIAGNOSIS — E43 Unspecified severe protein-calorie malnutrition: Secondary | ICD-10-CM | POA: Diagnosis not present

## 2015-09-18 DIAGNOSIS — L0292 Furuncle, unspecified: Secondary | ICD-10-CM

## 2015-09-18 DIAGNOSIS — A310 Pulmonary mycobacterial infection: Secondary | ICD-10-CM | POA: Diagnosis not present

## 2015-09-18 MED ORDER — MUPIROCIN CALCIUM 2 % EX CREA: 1.0000 "application " | TOPICAL_CREAM | Freq: Two times a day (BID) | CUTANEOUS | Status: DC

## 2015-09-18 NOTE — Progress Notes (Signed)
Daily Session Note  Patient Details  Name: AUDYN DIMERCURIO MRN: [id number] Date of Birth: [birth year] Referring Provider:    Encounter Date: 09/18/2015  Check In:     Session Check In - 09/18/15 1330    Check-In   Location [facility]   Staff Present Rosebud Poles, RN, BSN;Molly diVincenzo, MS, ACSM RCEP, Exercise Physiologist;Jochebed Bills Ysidro Evert, RN   Supervising physician immediately available to respond to emergencies [facility] immediately available   Physician(s) Dr. Eliseo Squires   Medication changes reported     No   Fall or balance concerns reported    No   Warm-up and Cool-down Performed as group-led instruction   Resistance Training Performed Yes   VAD Patient? No   Pain Assessment   Currently in Pain? No/denies   Multiple Pain Sites No      Capillary Blood Glucose: No results found for this or any previous visit (from the past 24 hour(s)).      Exercise Prescription Changes - 09/18/15 1500    Exercise Review   Progression Yes   Response to Exercise   Blood Pressure (Admit) 102/52 mmHg   Blood Pressure (Exercise) 104/60 mmHg   Blood Pressure (Exit) 102/64 mmHg   Heart Rate (Admit) 94 bpm   Heart Rate (Exercise) 116 bpm   Heart Rate (Exit) 99 bpm   Oxygen Saturation (Admit) 94 %   Oxygen Saturation (Exercise) 91 %   Oxygen Saturation (Exit) 93 %   Rating of Perceived Exertion (Exercise) 13   Perceived Dyspnea (Exercise) 1   Duration Progress to 45 minutes of aerobic exercise without signs/symptoms of physical distress   Intensity THRR unchanged   Progression   Progression Continue to progress workloads to maintain intensity without signs/symptoms of physical distress.   Resistance Training   Training Prescription Yes   Weight orange bands   Reps 10-12   Interval Training   Interval Training No   NuStep   Level 3   Minutes 15   Arm Ergometer   Level 2   Minutes 15   Track   Laps 12   Minutes 15     Goals Met:  Exercise tolerated  well No report of cardiac concerns or symptoms Strength training completed today  Goals Unmet:  Not Applicable  Comments: Service time is from 1330 to 1445    Dr. Rush Farmer is Medical Director for Pulmonary Rehab at [facility].

## 2015-09-19 ENCOUNTER — Ambulatory Visit: Payer: BLUE CROSS/BLUE SHIELD | Admitting: Adult Health

## 2015-09-22 NOTE — Progress Notes (Signed)
RFV: follow up for pulmonary MAC/bronchiectasis Subjective:    Patient ID: Destiny Rodriguez, female    DOB: Nov 20, 1924, 82 y.o.   MRN: [id number]  HPI [name] is an 82yo F with chronic bronchiectasis, pulmonary fibrosis, hx of pulmonary MAC, ended tx in APril 2016, who reports having being treated for pneumonia in February, where she initially was treated with azithromycin with little improvement then had 2nd course of abtx which did appear to treat her infection. She had associated loss of weight. She has since started doing pulmonary rehabilitation, in addition to nutritional counseling to help with weight gain. The patient is attempting to still improve her intake in order to gain weight. She has noticed some improvement in her wellbeing with pulmonary rehab. She denies any worsening cough, feels that she has good exercise endurance.  Old micro: MAC still isolated in Aug 2016 = sputum cx  Allergies  Allergen Reactions  . Tequin Rash   Current Outpatient Prescriptions on File Prior to Visit  Medication Sig Dispense Refill  . albuterol (PROVENTIL HFA;VENTOLIN HFA) 108 (90 BASE) MCG/ACT inhaler Inhale 2 puffs into the lungs every 4 (four) hours as needed for wheezing or shortness of breath. 1 Inhaler 3  . albuterol (PROVENTIL) (2.5 MG/3ML) 0.083% nebulizer solution Take 3 mLs (2.5 mg total) by nebulization every 6 (six) hours as needed for wheezing or shortness of breath. 360 mL 11  . Biotin 1000 MCG tablet Take 1,000 mcg by mouth daily.    . budesonide-formoterol (SYMBICORT) 80-4.5 MCG/ACT inhaler Inhale 2 puffs into the lungs 2 (two) times daily. 1 Inhaler 12  . calcium carbonate (OS-CAL) 600 MG TABS Take 600 mg by mouth 2 (two) times daily with a meal.     . chlorpheniramine (CHLOR-TRIMETON) 4 MG tablet Take 4 mg by mouth at bedtime.    Marland Kitchen guaiFENesin (MUCINEX) 600 MG 12 hr tablet Take 2 tablets (1,200 mg total) by mouth 2 (two) times daily. (Patient taking differently: Take 1,200 mg by  mouth 2 (two) times daily as needed. )    . Liniments (SALONPAS PAIN RELIEF PATCH EX) Apply topically as needed.    . MULTIPLE VITAMIN PO Take 1 tablet by mouth daily.     . OXYGEN Inhale into the lungs at bedtime.    . ranitidine (ZANTAC) 150 MG tablet Take 1 tablet (150 mg total) by mouth daily after supper. 30 tablet 1  . HYDROcodone-homatropine (HYCODAN) 5-1.5 MG/5ML syrup Take 5 mLs by mouth at bedtime as needed for cough. (Patient not taking: Reported on 09/04/2015) 180 mL 0   No current facility-administered medications on file prior to visit.   Active Ambulatory Problems    Diagnosis Date Noted  . Mycobacterium avium-intracellulare complex ([place]) 06/07/2008  . HYPERLIPIDEMIA 03/25/2007  . SINUSITIS, CHRONIC 06/07/2008  . ALLERGIC RHINITIS 06/07/2008  . Bronchiectasis without acute exacerbation ([place]) 03/25/2007  . COPD (chronic obstructive pulmonary disease) with chronic bronchitis ([place]) 06/07/2008  . HIATAL HERNIA 03/25/2007  . Black stool 11/23/2010  . Pneumonia 12/06/2013  . Hemoptysis 12/07/2013   Resolved Ambulatory Problems    Diagnosis Date Noted  . UNSPECIFIED BACTERIAL PNEUMONIA 04/01/2010  . Hemoptysis 10/15/2008  . HEMOPTYSIS UNSPECIFIED 04/01/2010  . Hemoptysis 11/23/2010  . MAC (mycobacterium avium-intracellulare complex) 05/27/2011   Past Medical History  Diagnosis Date  . Allergic rhinitis   . Hyperlipidemia   . Chronic sinusitis   . Bronchiectasis   . Rheumatism   . Migraine       Review of Systems +  weight loss in February, slowly getting back to her baseline weight. Fatigue+    Objective:   Physical Exam BP 146/78 mmHg  Pulse 81  Temp(Src) 98 F (36.7 C) (Oral)  Wt 97 lb (43.999 kg) Physical Exam  Constitutional:  oriented to person, place, and time. appears well-developed and well-nourished. elderly female. No distress. Thin frame HENT: [place]/AT, PERRLA, no scleral icterus Mouth/Throat: Oropharynx is clear and moist. No oropharyngeal  exudate.  Cardiovascular: Normal rate, regular rhythm and normal heart sounds. Exam reveals no gallop and no friction rub.  No murmur heard.  Pulmonary/Chest: Effort normal and breath sounds normal. No respiratory distress.  has no wheezes.  Neck = supple, no nuchal rigidity Abdominal: Soft. Bowel sounds are normal.  exhibits no distension. There is no tenderness.  Lymphadenopathy: no cervical adenopathy. No axillary adenopathy Neurological: alert and oriented to person, place, and time.  Skin: Skin is warm and dry. No rash noted. No erythema.  Psychiatric: a normal mood and affect.  behavior is normal.         Assessment & Plan:  Pulmonary MAC = she has received intermittent treatment for pulmonary MAC, last of which in Spring 2016. Her recent pulmonary infection does not appear to have collected sputum culture. She still has high likelihood of isolating NTM in respiratory culture. Decision to treat, since it was difficult course due to medication side effects, would be based upon if she has worsening pulmonary symptoms. For now, she appear stable. Would like to see her back in 3 months to reassess if need to consider re-initiation of treatment.  Severe protein-caloric malnutrition = spent 15 min in direct counseling for means how to supplement diet  Bronchiectasis/pulmonary fibrosis = recommend to continue with pulmonary rehab  Spent 25 min with patient, with greater than 50% of time in counseling, reviewing records

## 2015-09-23 ENCOUNTER — Encounter (HOSPITAL_COMMUNITY)
Admission: RE | Admit: 2015-09-23 | Discharge: 2015-09-23 | Disposition: A | Discharge status: Home / Self Care | Payer: Medicare Other | Source: Ambulatory Visit | Attending: Pulmonary Disease | Admitting: Pulmonary Disease

## 2015-09-23 VITALS — Wt 97.4 lb

## 2015-09-23 DIAGNOSIS — J449 Chronic obstructive pulmonary disease, unspecified: Secondary | ICD-10-CM | POA: Insufficient documentation

## 2015-09-23 DIAGNOSIS — J42 Unspecified chronic bronchitis: Secondary | ICD-10-CM

## 2015-09-23 NOTE — Progress Notes (Signed)
Daily Session Note  Patient Details  Name: Destiny Rodriguez MRN: [id number] Date of Birth: 21-Jan-1925 Referring Provider:    Encounter Date: 09/23/2015  Check In:     Session Check In - 09/23/15 1507    Check-In   [facility]   Staff Present Rosebud Poles, RN, BSN;Molly diVincenzo, MS, ACSM RCEP, Exercise Physiologist   Supervising physician immediately available to respond to emergencies [facility] immediately available   Physician(s) Dr. Marily Memos   Medication changes reported     No   Fall or balance concerns reported    No   Warm-up and Cool-down Performed as group-led instruction   Resistance Training Performed Yes   VAD Patient? No   Pain Assessment   Currently in Pain? No/denies   Multiple Pain Sites No      Capillary Blood Glucose: No results found for this or any previous visit (from the past 24 hour(s)).      Exercise Prescription Changes - 09/23/15 1500    Response to Exercise   Blood Pressure (Admit) 102/60 mmHg   Blood Pressure (Exercise) 110/66 mmHg   Blood Pressure (Exit) 102/56 mmHg   Heart Rate (Admit) 89 bpm   Heart Rate (Exercise) 114 bpm   Heart Rate (Exit) 94 bpm   Oxygen Saturation (Admit) 96 %   Oxygen Saturation (Exercise) 92 %   Oxygen Saturation (Exit) 92 %   Rating of Perceived Exertion (Exercise) 13   Perceived Dyspnea (Exercise) 1   Duration Progress to 45 minutes of aerobic exercise without signs/symptoms of physical distress   Intensity THRR unchanged   Progression   Progression Continue to progress workloads to maintain intensity without signs/symptoms of physical distress.   Resistance Training   Training Prescription Yes   Weight orange bands   Reps 10-12   Interval Training   Interval Training No   NuStep   Level 3   Minutes 15   Arm Ergometer   Level 2   Minutes 15   Track   Laps 8   Minutes 15     Goals Met:  Exercise tolerated well No report of cardiac concerns or symptoms Strength  training completed today  Goals Unmet:  Not Applicable  Comments: Service time is from 1330 to 1500    Dr. Rush Farmer is Medical Director for Pulmonary Rehab at [facility].

## 2015-09-23 NOTE — Progress Notes (Signed)
I have reviewed a Home Exercise Prescription with [name] . Destiny Rodriguez is currently exercising at home.  The patient was advised to walk 2-3 days a week for 30 minutes.  Destiny Rodriguez and I discussed how to progress their exercise prescription.  The patient stated that their goals were to keep self motivated and increase leg strength.  The patient stated that they understand the exercise prescription.  We reviewed exercise guidelines, target heart rate during exercise, oxygen use, weather, home pulse oximeter, endpoints for exercise, and goals.  Patient is encouraged to come to me with any questions. I will continue to follow up with the patient to assist them with progression and safety.

## 2015-09-25 ENCOUNTER — Encounter (HOSPITAL_COMMUNITY)
Admission: RE | Admit: 2015-09-25 | Discharge: 2015-09-25 | Disposition: A | Discharge status: Home / Self Care | Payer: Medicare Other | Source: Ambulatory Visit | Attending: Pulmonary Disease | Admitting: Pulmonary Disease

## 2015-09-25 VITALS — Wt 96.3 lb

## 2015-09-25 DIAGNOSIS — J449 Chronic obstructive pulmonary disease, unspecified: Secondary | ICD-10-CM | POA: Diagnosis not present

## 2015-09-25 DIAGNOSIS — J42 Unspecified chronic bronchitis: Secondary | ICD-10-CM

## 2015-09-25 NOTE — Progress Notes (Signed)
Daily Session Note  Patient Details  Name: Destiny Rodriguez MRN: [id number] Date of Birth: 1925-02-12 Referring Provider:    Encounter Date: 09/25/2015  Check In:     Session Check In - 09/25/15 1351    Check-In   [facility]   Staff Present Rosebud Poles, RN, Luisa Hart, RN, BSN;Jearline Hirschhorn Ysidro Evert, RN;Molly diVincenzo, MS, ACSM RCEP, Exercise Physiologist   Supervising physician immediately available to respond to emergencies [facility] immediately available   Physician(s) Dr. Waldron Labs   Medication changes reported     No   Fall or balance concerns reported    No   Warm-up and Cool-down Performed as group-led instruction   Resistance Training Performed Yes   VAD Patient? No   Pain Assessment   Currently in Pain? No/denies   Multiple Pain Sites No      Capillary Blood Glucose: No results found for this or any previous visit (from the past 24 hour(s)).      Exercise Prescription Changes - 09/25/15 1600    Response to Exercise   Blood Pressure (Admit) 102/58 mmHg   Blood Pressure (Exercise) 112/66 mmHg   Blood Pressure (Exit) 114/60 mmHg   Heart Rate (Admit) 92 bpm   Heart Rate (Exercise) 112 bpm   Heart Rate (Exit) 94 bpm   Oxygen Saturation (Admit) 94 %   Oxygen Saturation (Exercise) 93 %   Oxygen Saturation (Exit) 91 %   Rating of Perceived Exertion (Exercise) 13   Perceived Dyspnea (Exercise) 1   Duration Progress to 45 minutes of aerobic exercise without signs/symptoms of physical distress   Intensity THRR unchanged   Progression   Progression Continue to progress workloads to maintain intensity without signs/symptoms of physical distress.   Resistance Training   Training Prescription Yes   Weight orange bands   Reps 10-12   Interval Training   Interval Training No   Arm Ergometer   Level 2   Minutes 15   Track   Laps 13   Minutes 15     Goals Met:  Exercise tolerated well No report of cardiac concerns or  symptoms Strength training completed today  Goals Unmet:  Not Applicable  Comments: Service time is from 1330 to 1530    Dr. Rush Farmer is Medical Director for Pulmonary Rehab at [facility].

## 2015-09-30 ENCOUNTER — Encounter (HOSPITAL_COMMUNITY)
Admission: RE | Admit: 2015-09-30 | Discharge: 2015-09-30 | Disposition: A | Discharge status: Home / Self Care | Payer: Medicare Other | Source: Ambulatory Visit | Attending: Pulmonary Disease | Admitting: Pulmonary Disease

## 2015-09-30 VITALS — Wt 97.4 lb

## 2015-09-30 DIAGNOSIS — J42 Unspecified chronic bronchitis: Secondary | ICD-10-CM

## 2015-09-30 DIAGNOSIS — J449 Chronic obstructive pulmonary disease, unspecified: Secondary | ICD-10-CM | POA: Diagnosis not present

## 2015-09-30 NOTE — Progress Notes (Signed)
Daily Session Note  Patient Details  Name: Destiny Rodriguez MRN: [id number] Date of Birth: 01/19/1925 Referring Provider:    Encounter Date: 09/30/2015  Check In:     Session Check In - 09/30/15 1554    Check-In   [facility]   Staff Present  , RN, BSN;Portia Payne, RN, BSN;Molly diVincenzo, MS, ACSM RCEP, Exercise Physiologist   Supervising physician immediately available to respond to emergencies [facility] immediately available   Physician(s) Dr. Merrell   Medication changes reported     No   Fall or balance concerns reported    No   Warm-up and Cool-down Performed as group-led instruction   Resistance Training Performed Yes   VAD Patient? No   Pain Assessment   Currently in Pain? No/denies   Multiple Pain Sites No      Capillary Blood Glucose: No results found for this or any previous visit (from the past 24 hour(s)).   Goals Met:  Improved SOB with ADL's Exercise tolerated well Strength training completed today  Goals Unmet:  Not Applicable  Comments: Service time is from 1330 to 1515    Dr. Wesam G. Yacoub is Medical Director for Pulmonary Rehab at [facility]. 

## 2015-10-02 ENCOUNTER — Encounter (HOSPITAL_COMMUNITY)
Admission: RE | Admit: 2015-10-02 | Discharge: 2015-10-02 | Disposition: A | Discharge status: Home / Self Care | Payer: Medicare Other | Source: Ambulatory Visit | Attending: Pulmonary Disease | Admitting: Pulmonary Disease

## 2015-10-02 VITALS — Wt 97.0 lb

## 2015-10-02 DIAGNOSIS — J42 Unspecified chronic bronchitis: Secondary | ICD-10-CM

## 2015-10-02 DIAGNOSIS — J449 Chronic obstructive pulmonary disease, unspecified: Secondary | ICD-10-CM | POA: Diagnosis not present

## 2015-10-02 NOTE — Progress Notes (Signed)
Daily Session Note  Patient Details  Name: Destiny Rodriguez MRN: [id number] Date of Birth: February 26, 1925 Referring Provider:    Encounter Date: 10/02/2015  Check In:     Session Check In - 10/02/15 1342    Check-In   [facility]   Staff Present Su Hilt, MS, ACSM RCEP, Exercise Physiologist;Kearston Putman Leonia Reeves, RN, Luisa Hart, RN, BSN   Supervising physician immediately available to respond to emergencies [facility] immediately available   Physician(s) Dr. Marily Memos   Medication changes reported     No   Fall or balance concerns reported    No   Warm-up and Cool-down Performed as group-led instruction   Resistance Training Performed Yes   VAD Patient? No   Pain Assessment   Currently in Pain? No/denies   Multiple Pain Sites No      Capillary Blood Glucose: No results found for this or any previous visit (from the past 24 hour(s)).      Exercise Prescription Changes - 10/02/15 1600    Response to Exercise   Blood Pressure (Admit) 120/70 mmHg   Blood Pressure (Exercise) 110/60 mmHg   Blood Pressure (Exit) 100/50 mmHg   Heart Rate (Admit) 93 bpm   Heart Rate (Exercise) 120 bpm   Heart Rate (Exit) 65 bpm   Oxygen Saturation (Admit) 93 %   Oxygen Saturation (Exercise) 91 %   Oxygen Saturation (Exit) 99 %   Rating of Perceived Exertion (Exercise) 11   Perceived Dyspnea (Exercise) 1   Duration Progress to 45 minutes of aerobic exercise without signs/symptoms of physical distress   Intensity THRR unchanged   Progression   Progression Continue to progress workloads to maintain intensity without signs/symptoms of physical distress.   Resistance Training   Training Prescription Yes   Weight orange bands   Reps 10-12   Interval Training   Interval Training No   NuStep   Level 4   Minutes 15   METs 2.7   Track   Laps 13   Minutes 15     Goals Met:  Improved SOB with ADL's Exercise tolerated well Strength training completed  today  Goals Unmet:  Not Applicable  Comments: Service time is from 1330 to [place]    Dr. Rush Farmer is Medical Director for Pulmonary Rehab at [facility].

## 2015-10-07 ENCOUNTER — Encounter (HOSPITAL_COMMUNITY)
Admission: RE | Admit: 2015-10-07 | Discharge: 2015-10-07 | Disposition: A | Discharge status: Home / Self Care | Payer: Medicare Other | Source: Ambulatory Visit | Attending: Pulmonary Disease | Admitting: Pulmonary Disease

## 2015-10-07 VITALS — Wt 97.4 lb

## 2015-10-07 DIAGNOSIS — J449 Chronic obstructive pulmonary disease, unspecified: Secondary | ICD-10-CM | POA: Diagnosis not present

## 2015-10-07 DIAGNOSIS — J42 Unspecified chronic bronchitis: Secondary | ICD-10-CM

## 2015-10-07 NOTE — Progress Notes (Signed)
Daily Session Note  Patient Details  Name: Destiny Rodriguez MRN: [id number] Date of Birth: 01/17/1925 Referring Provider:    Encounter Date: 10/07/2015  Check In:     Session Check In - 10/07/15 1329    Check-In   [facility]   Staff Present Su Hilt, MS, ACSM RCEP, Exercise Physiologist;Joan [place]onia Reeves, RN, Luisa Hart, RN, BSN   Supervising physician immediately available to respond to emergencies [facility] immediately available   Physician(s) Dr. Marily Memos   Medication changes reported     No   Fall or balance concerns reported    No   Warm-up and Cool-down Performed as group-led instruction   Resistance Training Performed Yes   VAD Patient? No   Pain Assessment   Currently in Pain? No/denies   Multiple Pain Sites No      Capillary Blood Glucose: No results found for this or any previous visit (from the past 24 hour(s)).      Exercise Prescription Changes - 10/07/15 1536    Response to Exercise   Blood Pressure (Admit) 122/62 mmHg   Blood Pressure (Exercise) 110/60 mmHg   Blood Pressure (Exit) 118/60 mmHg   Heart Rate (Admit) 99 bpm   Heart Rate (Exercise) 118 bpm   Heart Rate (Exit) 96 bpm   Oxygen Saturation (Admit) 97 %   Oxygen Saturation (Exercise) 92 %   Oxygen Saturation (Exit) 94 %   Rating of Perceived Exertion (Exercise) 13   Perceived Dyspnea (Exercise) 1   Duration Progress to 45 minutes of aerobic exercise without signs/symptoms of physical distress   Intensity THRR unchanged   Progression   Progression Continue to progress workloads to maintain intensity without signs/symptoms of physical distress.   Resistance Training   Training Prescription Yes   Weight orange bands   Reps 10-12   Interval Training   Interval Training No   NuStep   Level 4   Minutes 15   METs 2.8   Arm Ergometer   Level 2   Minutes 15   Track   Laps 12   Minutes 15     Goals Met:  Improved SOB with ADL's Using PLB  without cueing & demonstrates good technique Exercise tolerated well No report of cardiac concerns or symptoms Strength training completed today  Goals Unmet:  Not Applicable  Comments: Service time is from 1330 to 1500   Dr. Rush Farmer is Medical Director for Pulmonary Rehab at [facility].

## 2015-10-09 ENCOUNTER — Encounter (HOSPITAL_COMMUNITY)
Admission: RE | Admit: 2015-10-09 | Discharge: 2015-10-09 | Disposition: A | Discharge status: Home / Self Care | Payer: Medicare Other | Source: Ambulatory Visit | Attending: Pulmonary Disease | Admitting: Pulmonary Disease

## 2015-10-09 VITALS — Wt 98.3 lb

## 2015-10-09 DIAGNOSIS — J449 Chronic obstructive pulmonary disease, unspecified: Secondary | ICD-10-CM | POA: Diagnosis not present

## 2015-10-09 DIAGNOSIS — J42 Unspecified chronic bronchitis: Secondary | ICD-10-CM

## 2015-10-09 NOTE — Progress Notes (Signed)
Daily Session Note  Patient Details  Name: Destiny Rodriguez MRN: [id number] Date of Birth: 03-18-1925 Referring Provider:    Encounter Date: 10/09/2015  Check In:     Session Check In - 10/09/15 1359    Check-In   [facility]   Staff Present Rosebud Poles, RN, BSN;Lisa Ysidro Evert, RN;Portia Rollene Rotunda, RN, BSN;Molly diVincenzo, MS, ACSM RCEP, Exercise Physiologist   Supervising physician immediately available to respond to emergencies [facility] immediately available   Physician(s) Dr. Waldron Labs   Medication changes reported     No   Fall or balance concerns reported    No   Warm-up and Cool-down Performed as group-led instruction   Resistance Training Performed Yes   VAD Patient? No   Pain Assessment   Currently in Pain? No/denies   Multiple Pain Sites No      Capillary Blood Glucose: No results found for this or any previous visit (from the past 24 hour(s)).      Exercise Prescription Changes - 10/09/15 1600    Response to Exercise   Blood Pressure (Admit) 118/60 mmHg   Blood Pressure (Exercise) 116/67 mmHg   Blood Pressure (Exit) 110/60 mmHg   Heart Rate (Admit) 103 bpm   Heart Rate (Exercise) 114 bpm   Heart Rate (Exit) 98 bpm   Oxygen Saturation (Admit) 93 %   Oxygen Saturation (Exercise) 93 %   Oxygen Saturation (Exit) 93 %   Rating of Perceived Exertion (Exercise) 15   Perceived Dyspnea (Exercise) 1   Duration Progress to 45 minutes of aerobic exercise without signs/symptoms of physical distress   Intensity THRR unchanged   Progression   Progression Continue to progress workloads to maintain intensity without signs/symptoms of physical distress.   Resistance Training   Training Prescription Yes   Weight orange bands   Reps 10-12   Interval Training   Interval Training No   Arm Ergometer   Level 2   Minutes 15   Track   Laps 11   Minutes 15     Goals Met:  Improved SOB with ADL's Exercise tolerated well Strength training  completed today  Goals Unmet:  Not Applicable  Comments: Service time is from 1330 to 1530    Dr. Rush Farmer is Medical Director for Pulmonary Rehab at [facility].

## 2015-10-09 NOTE — Progress Notes (Signed)
Pulmonary Individual Treatment Plan  Patient Details  Name: Destiny Rodriguez MRN: [id number] Date of Birth: Jun 10, 1924 Referring Provider:    Initial Encounter Date:       Pulmonary Rehab Walk Test from 08/05/2015 in [place] [facility]   Date  08/05/15      Visit Diagnosis: Chronic bronchitis, unspecified chronic bronchitis type ([place])  Patient's Home Medications on Admission:   Current outpatient prescriptions:  .  albuterol (PROVENTIL HFA;VENTOLIN HFA) 108 (90 BASE) MCG/ACT inhaler, Inhale 2 puffs into the lungs every 4 (four) hours as needed for wheezing or shortness of breath., Disp: 1 Inhaler, Rfl: 3 .  albuterol (PROVENTIL) (2.5 MG/3ML) 0.083% nebulizer solution, Take 3 mLs (2.5 mg total) by nebulization every 6 (six) hours as needed for wheezing or shortness of breath., Disp: 360 mL, Rfl: 11 .  Biotin 1000 MCG tablet, Take 1,000 mcg by mouth daily., Disp: , Rfl:  .  budesonide-formoterol (SYMBICORT) 80-4.5 MCG/ACT inhaler, Inhale 2 puffs into the lungs 2 (two) times daily., Disp: 1 Inhaler, Rfl: 12 .  calcium carbonate (OS-CAL) 600 MG TABS, Take 600 mg by mouth 2 (two) times daily with a meal. , Disp: , Rfl:  .  chlorpheniramine (CHLOR-TRIMETON) 4 MG tablet, Take 4 mg by mouth at bedtime., Disp: , Rfl:  .  guaiFENesin (MUCINEX) 600 MG 12 hr tablet, Take 2 tablets (1,200 mg total) by mouth 2 (two) times daily. (Patient taking differently: Take 1,200 mg by mouth 2 (two) times daily as needed. ), Disp: , Rfl:  .  HYDROcodone-homatropine (HYCODAN) 5-1.5 MG/5ML syrup, Take 5 mLs by mouth at bedtime as needed for cough. (Patient not taking: Reported on 09/04/2015), Disp: 180 mL, Rfl: 0 .  Liniments (SALONPAS PAIN RELIEF PATCH EX), Apply topically as needed., Disp: , Rfl:  .  MULTIPLE VITAMIN PO, Take 1 tablet by mouth daily. , Disp: , Rfl:  .  mupirocin cream (BACTROBAN) 2 %, Apply 1 application topically 2 (two) times daily. To affected area as needed, Disp: 15 g,  Rfl: 0 .  OXYGEN, Inhale into the lungs at bedtime., Disp: , Rfl:  .  ranitidine (ZANTAC) 150 MG tablet, Take 1 tablet (150 mg total) by mouth daily after supper., Disp: 30 tablet, Rfl: 1  Past Medical History: Past Medical History  Diagnosis Date  . Allergic rhinitis   . Hyperlipidemia   . Chronic sinusitis   . Bronchiectasis   . Rheumatism   . MAC (mycobacterium avium-intracellulare complex)   . Migraine     Tobacco Use: History  Smoking status  . Never Smoker   Smokeless tobacco  . Never Used    Labs: Recent Review Flowsheet Data    There is no flowsheet data to display.      Capillary Blood Glucose: No results found for: GLUCAP   ADL UCSD:   Pulmonary Function Assessment:     Pulmonary Function Assessment - 08/04/15 1435    Breath   Bilateral Breath Sounds Rhonchi;Rales  bases bilat   Shortness of Breath Yes      Exercise Target Goals:    Exercise Program Goal: Individual exercise prescription set with THRR, safety & activity barriers. Participant demonstrates ability to understand and report RPE using BORG scale, to self-measure pulse accurately, and to acknowledge the importance of the exercise prescription.  Exercise Prescription Goal: Starting with aerobic activity 30 plus minutes a day, 3 days per week for initial exercise prescription. Provide home exercise prescription and guidelines that participant acknowledges understanding  prior to discharge.  Activity Barriers & Risk Stratification:     Activity Barriers & Cardiac Risk Stratification - 08/04/15 1433    Activity Barriers & Cardiac Risk Stratification   Activity Barriers Shortness of Breath;Deconditioning;Back Problems  back pain limits her gardening      6 Minute Walk:     6 Minute Walk      08/05/15 1607       6 Minute Walk   Phase Initial     Distance 1011 feet     Walk Time 6 minutes     # of Rest Breaks 0     MPH 1.91     METS 2.47     RPE 12     Perceived Dyspnea  1      VO2 Peak 7.03     Symptoms No     Resting HR 99 bpm     Resting BP 112/70 mmHg     Max Ex. HR 112 bpm     Max Ex. BP 120/72 mmHg     2 Minute Post BP 112/60 mmHg     Interval HR   Baseline HR 99     1 Minute HR 112     2 Minute HR 108     3 Minute HR 107     4 Minute HR 106     5 Minute HR 108     6 Minute HR 110     2 Minute Post HR 103     Interval Heart Rate? Yes     Interval Oxygen   Interval Oxygen? --  room air     Pre/Post BP   Baseline BP 112/70 mmHg     6 Minute BP 120/72 mmHg     Pre/Post BP? Yes        Initial Exercise Prescription:     Initial Exercise Prescription - 08/06/15 0800    Date of Initial Exercise RX and Referring Provider   Date 08/05/15   Oxygen   Oxygen --  room air   Bike   Level 0.2   Minutes 15   METs 1.84   NuStep   Level 1   Minutes 15   METs 2   Track   Laps 9   Minutes 15   METs 2.04   Prescription Details   Frequency (times per week) 2   Duration Progress to 45 minutes of aerobic exercise without signs/symptoms of physical distress   Intensity   THRR 40-80% of Max Heartrate 53-106   Ratings of Perceived Exertion 11-13   Perceived Dyspnea 0-4   Resistance Training   Training Prescription Yes   Weight orange bands   Reps 10-12      Perform Capillary Blood Glucose checks as needed.  Exercise Prescription Changes:     Exercise Prescription Changes      08/19/15 1600 09/02/15 1500 09/04/15 1610 09/09/15 1500 09/16/15 1500   Exercise Review   Progression No No No     Response to Exercise   Blood Pressure (Admit) 132/60 mmHg 116/60 mmHg 122/58 mmHg 100/54 mmHg 110/52 mmHg   Blood Pressure (Exercise) 122/70 mmHg 110/60 mmHg 104/60 mmHg 122/56 mmHg 122/80 mmHg   Blood Pressure (Exit) 134/74 mmHg 94/60 mmHg 116/56 mmHg 102/52 mmHg 108/60 mmHg   Heart Rate (Admit) 96 bpm 93 bpm 98 bpm 95 bpm 92 bpm   Heart Rate (Exercise) 107 bpm 108 bpm 106 bpm 103 bpm 107 bpm   Heart Rate (Exit) 104 bpm  95 bpm 93 bpm 89 bpm 86  bpm   Oxygen Saturation (Admit) 94 % 95 % 95 % 92 % 93 %   Oxygen Saturation (Exercise) 92 % 93 % 91 % 92 % 90 %   Oxygen Saturation (Exit) 94 % 92 % 93 % 94 % 95 %   Rating of Perceived Exertion (Exercise) 13 13 12 13 13    Perceived Dyspnea (Exercise) 1 3 2 1 2    Symptoms left hip pain with biking, tried airdyne and recumbent bike       Comments will try other equipment to not aggravate hip pain       Duration Progress to 45 minutes of aerobic exercise without signs/symptoms of physical distress Progress to 45 minutes of aerobic exercise without signs/symptoms of physical distress Progress to 45 minutes of aerobic exercise without signs/symptoms of physical distress Progress to 45 minutes of aerobic exercise without signs/symptoms of physical distress Progress to 45 minutes of aerobic exercise without signs/symptoms of physical distress   Intensity Other (comment)  40-80% HRR    THRR unchanged   Progression   Progression Continue to progress workloads to maintain intensity without signs/symptoms of physical distress. Continue to progress workloads to maintain intensity without signs/symptoms of physical distress. Continue to progress workloads to maintain intensity without signs/symptoms of physical distress. Continue to progress workloads to maintain intensity without signs/symptoms of physical distress. Continue to progress workloads to maintain intensity without signs/symptoms of physical distress.   Resistance Training   Training Prescription Yes Yes Yes Yes Yes   Weight orange bands orange bands orange bands orange bands orange bands   Reps 10-12 10-12 10-12 10-12 10-12   Interval Training   Interval Training No No No No No   Bike   Level 0.2       Minutes 15       NuStep   Level 1 1 1 1     Minutes 7 15 15 15     METs  1.3 1.6 1.6    Arm Ergometer   Level 1 1 1 1 1    Minutes 15 15 15 15 15    Track   Laps 7 11 11 10 10    Minutes 15 15 15 15 15      09/18/15 1500 09/23/15 1500  09/25/15 1600 09/30/15 1500 10/02/15 1600   Exercise Review   Progression Yes   Yes    Response to Exercise   Blood Pressure (Admit) 102/52 mmHg 102/60 mmHg 102/58 mmHg 108/50 mmHg 120/70 mmHg   Blood Pressure (Exercise) 104/60 mmHg 110/66 mmHg 112/66 mmHg 118/60 mmHg 110/60 mmHg   Blood Pressure (Exit) 102/64 mmHg 102/56 mmHg 114/60 mmHg 96/60 mmHg 100/50 mmHg   Heart Rate (Admit) 94 bpm 89 bpm 92 bpm 89 bpm 93 bpm   Heart Rate (Exercise) 116 bpm 114 bpm 112 bpm 120 bpm 120 bpm   Heart Rate (Exit) 99 bpm 94 bpm 94 bpm 95 bpm 65 bpm   Oxygen Saturation (Admit) 94 % 96 % 94 % 93 % 93 %   Oxygen Saturation (Exercise) 91 % 92 % 93 % 90 % 91 %   Oxygen Saturation (Exit) 93 % 92 % 91 % 95 % 99 %   Rating of Perceived Exertion (Exercise) 13 13 13 13 11    Perceived Dyspnea (Exercise) 1 1 1 1 1    Comments  reviewed home exercise prescription      Duration Progress to 45 minutes of aerobic exercise without signs/symptoms of physical distress Progress to  45 minutes of aerobic exercise without signs/symptoms of physical distress Progress to 45 minutes of aerobic exercise without signs/symptoms of physical distress Progress to 45 minutes of aerobic exercise without signs/symptoms of physical distress Progress to 45 minutes of aerobic exercise without signs/symptoms of physical distress   Intensity THRR unchanged THRR unchanged THRR unchanged THRR unchanged THRR unchanged   Progression   Progression Continue to progress workloads to maintain intensity without signs/symptoms of physical distress. Continue to progress workloads to maintain intensity without signs/symptoms of physical distress. Continue to progress workloads to maintain intensity without signs/symptoms of physical distress. Continue to progress workloads to maintain intensity without signs/symptoms of physical distress. Continue to progress workloads to maintain intensity without signs/symptoms of physical distress.   Resistance Training    Training Prescription Yes Yes Yes Yes Yes   Weight orange bands orange bands orange bands orange bands orange bands   Reps 10-12 10-12 10-12 10-12 10-12   Interval Training   Interval Training No No No No No   NuStep   Level 3 3  4 4    Minutes 15 15  15 15    METs    2.6 2.7   Arm Ergometer   Level 2 2 2 2     Minutes 15 15 15 15     Track   Laps 12 8 13 12 13    Minutes 15 15 15 15 15      10/07/15 1536 10/09/15 1600         Response to Exercise   Blood Pressure (Admit) 122/62 mmHg 118/60 mmHg      Blood Pressure (Exercise) 110/60 mmHg 116/67 mmHg      Blood Pressure (Exit) 118/60 mmHg 110/60 mmHg      Heart Rate (Admit) 99 bpm 103 bpm      Heart Rate (Exercise) 118 bpm 114 bpm      Heart Rate (Exit) 96 bpm 98 bpm      Oxygen Saturation (Admit) 97 % 93 %      Oxygen Saturation (Exercise) 92 % 93 %      Oxygen Saturation (Exit) 94 % 93 %      Rating of Perceived Exertion (Exercise) 13 15      Perceived Dyspnea (Exercise) 1 1      Duration Progress to 45 minutes of aerobic exercise without signs/symptoms of physical distress Progress to 45 minutes of aerobic exercise without signs/symptoms of physical distress      Intensity THRR unchanged THRR unchanged      Progression   Progression Continue to progress workloads to maintain intensity without signs/symptoms of physical distress. Continue to progress workloads to maintain intensity without signs/symptoms of physical distress.      Resistance Training   Training Prescription Yes Yes      Weight orange bands orange bands      Reps 10-12 10-12      Interval Training   Interval Training No No      NuStep   Level 4       Minutes 15       METs 2.8       Arm Ergometer   Level 2 2      Minutes 15 15      Track   Laps 12 11      Minutes 15 15         Exercise Comments:     Exercise Comments      09/11/15 0845 10/09/15 0947  Exercise Comments Patient is cont. to progress exercise tolerance. Will continue to  monitor progression. Patient is cont. to progress exercise tolerance. Will continue to monitor progression.         Discharge Exercise Prescription (Final Exercise Prescription Changes):     Exercise Prescription Changes - 10/09/15 1600    Response to Exercise   Blood Pressure (Admit) 118/60 mmHg   Blood Pressure (Exercise) 116/67 mmHg   Blood Pressure (Exit) 110/60 mmHg   Heart Rate (Admit) 103 bpm   Heart Rate (Exercise) 114 bpm   Heart Rate (Exit) 98 bpm   Oxygen Saturation (Admit) 93 %   Oxygen Saturation (Exercise) 93 %   Oxygen Saturation (Exit) 93 %   Rating of Perceived Exertion (Exercise) 15   Perceived Dyspnea (Exercise) 1   Duration Progress to 45 minutes of aerobic exercise without signs/symptoms of physical distress   Intensity THRR unchanged   Progression   Progression Continue to progress workloads to maintain intensity without signs/symptoms of physical distress.   Resistance Training   Training Prescription Yes   Weight orange bands   Reps 10-12   Interval Training   Interval Training No   Arm Ergometer   Level 2   Minutes 15   Track   Laps 11   Minutes 15       Nutrition:  Target Goals: Understanding of nutrition guidelines, daily intake of sodium <1559m, cholesterol <2011m calories 30% from fat and 7% or less from saturated fats, daily to have 5 or more servings of fruits and vegetables.  Biometrics:     Pre Biometrics - 08/04/15 1414    Pre Biometrics   Grip Strength 23 kg       Nutrition Therapy Plan and Nutrition Goals:     Nutrition Therapy & Goals - 09/16/15 1512    Nutrition Therapy   Diet High Calorie, High Protein   Personal Nutrition Goals   Personal Goal #1 0.5-2 lb wt gain to a goal wt of 100-105 lb [facility] regarding individualized specific dietary modifications aiming towards targeted core components such as weight, hypertension,  lipid management, diabetes, heart failure and other comorbidities.   Expected Outcomes Short Term Goal: Understand basic principles of dietary content, such as calories, fat, sodium, cholesterol and nutrients.;Long Term Goal: Adherence to prescribed nutrition plan.      Nutrition Discharge: Rate Your Plate Scores:     Nutrition Assessments - 08/14/15 1529    Rate Your Plate Scores   Pre Score 63      Psychosocial: Target Goals: Acknowledge presence or absence of depression, maximize coping skills, provide positive support system. Participant is able to verbalize types and ability to use techniques and skills needed for reducing stress and depression.  Initial Review & Psychosocial Screening:     Initial Psych Review & Screening - 08/04/15 [place]   Barriers   Psychosocial barriers to participate in program There are no identifiable barriers or psychosocial needs.;The patient should benefit from training in stress management and relaxation.   Screening Interventions   Interventions Encouraged to exercise      Quality of Life Scores:   PHQ-9:     Recent Review Flowsheet Data    [facility] 09/18/2015 08/04/2015 03/06/2015 07/25/2014 04/25/2014   Decreased Interest 0 1 0 0 0   Down, Depressed, Hopeless 0 0 0  0 0   PHQ - 2 Score 0 1 0 0 0      Psychosocial Evaluation and Intervention:   Psychosocial Re-Evaluation:     Psychosocial Re-Evaluation      08/21/15 1618 10/07/15 1630         Psychosocial Re-Evaluation   Interventions Encouraged to attend Pulmonary Rehabilitation for the exercise Encouraged to attend Pulmonary Rehabilitation for the exercise      Comments No psychosocial issues are identified at this time, encouraged to exercise in pulmonary rehab. --  continues to have no psychsocial issues that need to be addressed      Continued Psychosocial Services Needed Yes  reevaluate in 30 days for changes in  psychosocial needs No        Education: Education Goals: Education classes will be provided on a weekly basis, covering required topics. Participant will state understanding/return demonstration of topics presented.  Learning Barriers/Preferences:     Learning Barriers/Preferences - 08/04/15 1434    Learning Barriers/Preferences   Learning Barriers None   Learning Preferences Individual Instruction      Education Topics: Risk Factor Reduction:  -Group instruction that is supported by a PowerPoint presentation. Instructor discusses the definition of a risk factor, different risk factors for pulmonary disease, and how the heart and lungs work together.     Nutrition for Pulmonary Patient:  -Group instruction provided by PowerPoint slides, verbal discussion, and written materials to support subject matter. The instructor gives an explanation and review of healthy diet recommendations, which includes a discussion on weight management, recommendations for fruit and vegetable consumption, as well as protein, fluid, caffeine, fiber, sodium, sugar, and alcohol. Tips for eating when patients are short of breath are discussed.   Pursed Lip Breathing:  -Group instruction that is supported by demonstration and informational handouts. Instructor discusses the benefits of pursed lip and diaphragmatic breathing and detailed demonstration on how to preform both.            PULMONARY REHAB OTHER RESPIRATORY from 09/25/2015 in [place]   Date  09/04/15   Educator  RT   Instruction Review Code  2- meets goals/outcomes      Oxygen Safety:  -Group instruction provided by PowerPoint, verbal discussion, and written material to support subject matter. There is an overview of "What is Oxygen" and "Why do we need it".  Instructor also reviews how to create a safe environment for oxygen use, the importance of using oxygen as prescribed, and the risks of noncompliance. There  is a brief discussion on traveling with oxygen and resources the patient may utilize.   Oxygen Equipment:  -Group instruction provided by [facility] Staff utilizing handouts, written materials, and equipment demonstrations.   Signs and Symptoms:  -Group instruction provided by written material and verbal discussion to support subject matter. Warning signs and symptoms of infection, stroke, and heart attack are reviewed and when to call the physician/911 reinforced. Tips for preventing the spread of infection discussed.   Advanced Directives:  -Group instruction provided by verbal instruction and written material to support subject matter. Instructor reviews Advanced Directive laws and proper instruction for filling out document.      PULMONARY REHAB OTHER RESPIRATORY from 10/09/2015 in [place]   Date  10/09/15   Educator  Jeanella Craze   Instruction Review Code  2- meets goals/outcomes      Pulmonary Video:  -Group video education that reviews the importance of medication and  oxygen compliance, exercise, good nutrition, pulmonary hygiene, and pursed lip and diaphragmatic breathing for the pulmonary patient.   Exercise for the Pulmonary Patient:  -Group instruction that is supported by a PowerPoint presentation. Instructor discusses benefits of exercise, core components of exercise, frequency, duration, and intensity of an exercise routine, importance of utilizing pulse oximetry during exercise, safety while exercising, and options of places to exercise outside of rehab.        PULMONARY REHAB OTHER RESPIRATORY from 10/02/2015 in [place]   Date  10/02/15   Educator  RN   Instruction Review Code  2- meets goals/outcomes      Pulmonary Medications:  -Verbally interactive group education provided by instructor with focus on inhaled medications and proper administration.      PULMONARY REHAB OTHER RESPIRATORY from  09/25/2015 in [place]   Educator  -- [facility]/ Pharmacist]   Instruction Review Code  2- meets goals/outcomes      Anatomy and Physiology of the Respiratory System and Intimacy:  -Group instruction provided by PowerPoint, verbal discussion, and written material to support subject matter. Instructor reviews respiratory cycle and anatomical components of the respiratory system and their functions. Instructor also reviews differences in obstructive and restrictive respiratory diseases with examples of each. Intimacy, Sex, and Sexuality differences are reviewed with a discussion on how relationships can change when diagnosed with pulmonary disease. Common sexual concerns are reviewed.   Knowledge Questionnaire Score:   Core Components/Risk Factors/Patient Goals at Admission:     Personal Goals and Risk Factors at Admission - 08/21/15 1623    Core Components/Risk Factors/Patient Goals on Admission   Personal Goal Other Yes      Core Components/Risk Factors/Patient Goals Review:      Goals and Risk Factor Review      08/21/15 1625 09/16/15 1514 09/16/15 1516 10/07/15 1626     Core Components/Risk Factors/Patient Goals Review   Personal Goals Review Increase Strength and Stamina;Develop more efficient breathing techniques such as purse lipped breathing and diaphragmatic breathing and practicing self-pacing with activity.;Improve shortness of breath with ADL's;Weight Management/Obesity Weight Management/Obesity Weight Management/Obesity  Pt is underweight.     Review Sandeep has only exercised one session in pulmonary rehab, we will assist her in gaining weight by giving written and verbal information in ways to do so.  We hope to increase her strength and stamina, improve her shortness of breath and develope more efficient breathing techniques with exercise in pulmonary rehab.   In approx 2-3 weeks we will address her home exercise instructions Pt has  maintained her wt at this time. Pt educated re: High Calorie, High Protein diet. Suggestions for appetite stimulants given to pt for her to discuss with Dr. Graylon Good this week. Pt has maintained her wt at this time. Pt educated re: High Calorie, High Protein diet. Suggestions for appetite stimulants given to pt for her to discuss with Dr. Graylon Good this week. --  She has gained some strength and stamina and has noticed having more energy doing ADL's at home.  No weight gain at this time, maintained her weight    Expected Outcomes Slow, gradual weight gain along with increased strength and stamina.  Hopefully her SOB will decrease and her ADL's will become easier. Wt gain to a goal wt of 100-105 lb at graduation from Pulmonary Rehab Wt gain to a goal wt of 100-105 lb at graduation from Pulmonary Rehab --  Increased workloads will improve her  strength and stamina and decrease her SOB       Core Components/Risk Factors/Patient Goals at Discharge (Final Review):      Goals and Risk Factor Review - 10/07/15 1626    Core Components/Risk Factors/Patient Goals Review   Review --  She has gained some strength and stamina and has noticed having more energy doing ADL's at home.  No weight gain at this time, maintained her weight   Expected Outcomes --  Increased workloads will improve her strength and stamina and decrease her SOB      ITP Comments:   Comments: ITP REVIEW Pt is making expected progress toward personal goals after completing 13sessions.   Recommend continued exercise, life style modification, education, and utilization of breathing texhniques to increase stamina and strength and decrease shortness of breath with exertion.  See core components and psychosocial for comments.

## 2015-10-14 ENCOUNTER — Encounter (HOSPITAL_COMMUNITY)
Admission: RE | Admit: 2015-10-14 | Discharge: 2015-10-14 | Disposition: A | Discharge status: Home / Self Care | Payer: Medicare Other | Source: Ambulatory Visit | Attending: Pulmonary Disease | Admitting: Pulmonary Disease

## 2015-10-14 VITALS — Wt 98.3 lb

## 2015-10-14 DIAGNOSIS — J449 Chronic obstructive pulmonary disease, unspecified: Secondary | ICD-10-CM | POA: Diagnosis not present

## 2015-10-14 DIAGNOSIS — J42 Unspecified chronic bronchitis: Secondary | ICD-10-CM

## 2015-10-14 NOTE — Progress Notes (Signed)
Daily Session Note  Patient Details  Name: LUSIA GREIS MRN: [id number] Date of Birth: 1924-09-12 Referring Provider:    Encounter Date: 10/14/2015  Check In:     Session Check In - 10/14/15 1531    Check-In   Location [facility]   Staff Present Trish Fountain, RN, BSN;Molly diVincenzo, MS, ACSM RCEP, Exercise Physiologist   Supervising physician immediately available to respond to emergencies [facility] immediately available   Physician(s) Dr. Marily Memos   Medication changes reported     No   Fall or balance concerns reported    No   Warm-up and Cool-down Performed as group-led instruction   Resistance Training Performed Yes   VAD Patient? No   Pain Assessment   Currently in Pain? No/denies   Multiple Pain Sites No      Capillary Blood Glucose: No results found for this or any previous visit (from the past 24 hour(s)).      Exercise Prescription Changes - 10/14/15 1500    Exercise Review   Progression Yes   Response to Exercise   Blood Pressure (Admit) 100/60 mmHg   Blood Pressure (Exercise) 110/60 mmHg   Blood Pressure (Exit) 108/62 mmHg   Heart Rate (Admit) 92 bpm   Heart Rate (Exercise) 108 bpm   Heart Rate (Exit) 95 bpm   Oxygen Saturation (Admit) 94 %   Oxygen Saturation (Exercise) 91 %   Oxygen Saturation (Exit) 95 %   Rating of Perceived Exertion (Exercise) 13   Perceived Dyspnea (Exercise) 2   Duration Progress to 45 minutes of aerobic exercise without signs/symptoms of physical distress   Intensity THRR unchanged   Progression   Progression Continue to progress workloads to maintain intensity without signs/symptoms of physical distress.   Resistance Training   Training Prescription Yes   Weight orange bands   Reps 10-12   Interval Training   Interval Training No   NuStep   Level 4   Minutes 15   METs 2.6   Arm Ergometer   Level 3   Minutes 15   Track   Laps 11   Minutes 15     Goals Met:  Exercise tolerated  well No report of cardiac concerns or symptoms Strength training completed today  Goals Unmet:  Not Applicable  Comments: Service time is from 1330 to 1500    Dr. Rush Farmer is Medical Director for Pulmonary Rehab at [facility].

## 2015-10-16 ENCOUNTER — Encounter (HOSPITAL_COMMUNITY)
Admission: RE | Admit: 2015-10-16 | Discharge: 2015-10-16 | Disposition: A | Discharge status: Home / Self Care | Payer: Medicare Other | Source: Ambulatory Visit | Attending: Pulmonary Disease | Admitting: Pulmonary Disease

## 2015-10-16 VITALS — Wt 97.7 lb

## 2015-10-16 DIAGNOSIS — J449 Chronic obstructive pulmonary disease, unspecified: Secondary | ICD-10-CM | POA: Diagnosis not present

## 2015-10-16 DIAGNOSIS — J42 Unspecified chronic bronchitis: Secondary | ICD-10-CM

## 2015-10-16 NOTE — Progress Notes (Signed)
Daily Session Note  Patient Details  Name: Destiny Rodriguez MRN: [id number] Date of Birth: May 07, 1925 Referring Provider:    Encounter Date: 10/16/2015  Check In:     Session Check In - 10/16/15 1446    Check-In   [facility]   Staff Present Rosebud Poles, RN, BSN;Molly diVincenzo, MS, ACSM RCEP, Exercise Physiologist;Anddy Wingert Ysidro Evert, RN;Portia Rollene Rotunda, RN, BSN   Supervising physician immediately available to respond to emergencies [facility] immediately available   Physician(s) Dr. Eliseo Squires   Medication changes reported     No   Fall or balance concerns reported    No   Warm-up and Cool-down Performed as group-led instruction   Resistance Training Performed Yes   VAD Patient? No   Pain Assessment   Currently in Pain? No/denies   Multiple Pain Sites No      Capillary Blood Glucose: No results found for this or any previous visit (from the past 24 hour(s)).      Exercise Prescription Changes - 10/16/15 1500    Response to Exercise   Blood Pressure (Admit) 104/56 mmHg   Blood Pressure (Exercise) 140/60 mmHg   Blood Pressure (Exit) 96/50 mmHg   Heart Rate (Admit) 92 bpm   Heart Rate (Exercise) 104 bpm   Heart Rate (Exit) 98 bpm   Oxygen Saturation (Admit) 92 %   Oxygen Saturation (Exercise) 93 %   Oxygen Saturation (Exit) 92 %   Rating of Perceived Exertion (Exercise) 13   Perceived Dyspnea (Exercise) 1   Duration Progress to 45 minutes of aerobic exercise without signs/symptoms of physical distress   Intensity THRR unchanged   Progression   Progression Continue to progress workloads to maintain intensity without signs/symptoms of physical distress.   Resistance Training   Training Prescription Yes   Weight orange bands   Reps 10-12   Interval Training   Interval Training No   NuStep   Level 4   Minutes 15   METs 2.8   Arm Ergometer   Level 3   Minutes 15     Goals Met:  Exercise tolerated well No report of cardiac concerns or  symptoms Strength training completed today  Goals Unmet:  Not Applicable  Comments:Service time is from 1330 to 1515     Dr. Rush Farmer is Medical Director for Pulmonary Rehab at [facility].

## 2015-10-21 ENCOUNTER — Encounter (HOSPITAL_COMMUNITY)
Admission: RE | Admit: 2015-10-21 | Discharge: 2015-10-21 | Disposition: A | Discharge status: Home / Self Care | Payer: Medicare Other | Source: Ambulatory Visit | Attending: Pulmonary Disease | Admitting: Pulmonary Disease

## 2015-10-21 ENCOUNTER — Ambulatory Visit (INDEPENDENT_AMBULATORY_CARE_PROVIDER_SITE_OTHER): Payer: Medicare Other | Admitting: Pulmonary Disease

## 2015-10-21 ENCOUNTER — Encounter: Payer: Self-pay | Admitting: Pulmonary Disease

## 2015-10-21 VITALS — Wt 95.5 lb

## 2015-10-21 VITALS — BP 122/62 | HR 92 | Ht 64.5 in | Wt 95.6 lb

## 2015-10-21 DIAGNOSIS — A310 Pulmonary mycobacterial infection: Secondary | ICD-10-CM

## 2015-10-21 DIAGNOSIS — J449 Chronic obstructive pulmonary disease, unspecified: Secondary | ICD-10-CM

## 2015-10-21 DIAGNOSIS — J9611 Chronic respiratory failure with hypoxia: Secondary | ICD-10-CM

## 2015-10-21 DIAGNOSIS — J471 Bronchiectasis with (acute) exacerbation: Secondary | ICD-10-CM | POA: Diagnosis not present

## 2015-10-21 DIAGNOSIS — J42 Unspecified chronic bronchitis: Secondary | ICD-10-CM

## 2015-10-21 NOTE — Patient Instructions (Signed)
Keep going to pulmonary rehabilitation Increase your calorie intake Keep taking your medicines as you're doing Uses therapy vest twice a day as you're doing We will see you back in 4 months or sooner if needed

## 2015-10-21 NOTE — Assessment & Plan Note (Signed)
She remains compliant with her therapy vest and she has not had an exacerbation since the last episode.  She was encouraged to continue using the therapy vest. She was encouraged to eat a high calorie diet If she has a flare up, we will treat with 14 days of Levaquin

## 2015-10-21 NOTE — Assessment & Plan Note (Signed)
Stable interval since the pneumonia.  Plan: Continue symbicort Continue pulmonary rehab

## 2015-10-21 NOTE — Progress Notes (Signed)
Subjective:    Patient ID: Destiny Rodriguez, female    DOB: 05-16-1925, 82 y.o.   MRN: [id number]  Synopsis: Former Patient of Dr. Gwenette Greet with COPD and MAC; She does better with longer courses of antibiotics (14 days) when she has flare ups. Rx with clarithro/ethamb 2000-2001 Started clarithro/ethamb/rif 08/2010  >  finished 50mos of treatment ending May 2014.  She experienced severe muscle cramps due to antibiotics.   CT chest 04/2011: large cavitary disease superimposed over bronchiectasis.  Started on MAC meds per ID 05/2011 CT chest 12/20/12 >Bronchiectasis, pulmonary parenchymal cysts,   A collapsed cyst in the left upper lobe with associated presumed pulmonary parenchymal scarring   Uses therapy vest twice a day.  Spirometry 2010: FEV1 1.26 (71%), ratio 65. Good response to LABA/ICS  AUGUST 2016 Sputum> pseudomonas, cipro sensitive August 2016 Sputum > MAC positive x2  Had pneumonia in March 2017. Refer to pulmonary rehabilitation in April 2017.  HPI Chief Complaint  Patient presents with  . Follow-up    Pt c/o occasional dry cough. Pt denies SOB/wheeze/CP/tightness. Pt also c/o having a foot cramp that she believes may be due to abx use.    Destiny Rodriguez is "doing real good".  She says that she is feeling much better than when she was sick back in March.  She notes that she had a "bad cramp" in her foot a few weeks ago that go better on its own.  She notes that there have been times in the past when she had cramps from antibiotics at the time.  The pain has since gone away.  It occurred the day after pulmonary rehab.  This has completely resolved.    She feels that her dyspnea has improved with pulmonary rehab, a lot.  She continues to use her therapy vest twice a day.  She has been advised to eat more calories by the dietician.  She has not gained weight since starting this regimen.  It is hard fro her because she always avoided fats.    Past Medical History  Diagnosis Date   . Allergic rhinitis   . Hyperlipidemia   . Chronic sinusitis   . Bronchiectasis   . Rheumatism   . MAC (mycobacterium avium-intracellulare complex)   . Migraine       Review of Systems  Constitutional: Negative for fever, chills and fatigue.  HENT: Negative for postnasal drip, rhinorrhea and sinus pressure.   Respiratory: Negative for cough, shortness of breath and wheezing.   Cardiovascular: Negative for chest pain, palpitations and leg swelling.       Objective:   Physical Exam Filed Vitals:   10/21/15 1105  BP: 122/62  Pulse: 92  Height: 5' 4.5" (1.638 m)  Weight: 95 lb 9.6 oz (43.364 kg)  SpO2: 93%    Gen: well appearing HENT: OP clear, TM's clear, neck supple PULM: Crackles both bases bilaterally CV: RRR, slight systolic murmur RUSB, and LLSB, trace edema GI: BS+, soft, nontender Derm: no cyanosis or rash Psyche: normal mood and affect       Assessment & Plan:  COPD (chronic obstructive pulmonary disease) with chronic bronchitis Stable interval since the pneumonia.  Plan: Continue symbicort Continue pulmonary rehab  Bronchiectasis without acute exacerbation ([place]) She remains compliant with her therapy vest and she has not had an exacerbation since the last episode.  She was encouraged to continue using the therapy vest. She was encouraged to eat a high calorie diet If she has a flare up,  we will treat with 14 days of Levaquin  Mycobacterium avium-intracellulare complex ([place]) Despite her recent pneumonia, I don't feel that her colonization with MAC is causing an active infection as her symptoms have improved and her weight is stable right now.  Plan: Continue monitoring as we are doing If recurrent bronchiectasis flares this year then re-sample sputum for AFB and consider treatment  > 50% of visit spent face to face, 26 minute visit   Current outpatient prescriptions:  .  albuterol (PROVENTIL HFA;VENTOLIN HFA) 108 (90 BASE) MCG/ACT inhaler,  Inhale 2 puffs into the lungs every 4 (four) hours as needed for wheezing or shortness of breath., Disp: 1 Inhaler, Rfl: 3 .  albuterol (PROVENTIL) (2.5 MG/3ML) 0.083% nebulizer solution, Take 3 mLs (2.5 mg total) by nebulization every 6 (six) hours as needed for wheezing or shortness of breath., Disp: 360 mL, Rfl: 11 .  Biotin 1000 MCG tablet, Take 1,000 mcg by mouth daily., Disp: , Rfl:  .  budesonide-formoterol (SYMBICORT) 80-4.5 MCG/ACT inhaler, Inhale 2 puffs into the lungs 2 (two) times daily., Disp: 1 Inhaler, Rfl: 12 .  calcium carbonate (OS-CAL) 600 MG TABS, Take 600 mg by mouth 2 (two) times daily with a meal. , Disp: , Rfl:  .  chlorpheniramine (CHLOR-TRIMETON) 4 MG tablet, Take 4 mg by mouth at bedtime., Disp: , Rfl:  .  guaiFENesin (MUCINEX) 600 MG 12 hr tablet, Take 2 tablets (1,200 mg total) by mouth 2 (two) times daily. (Patient taking differently: Take 1,200 mg by mouth 2 (two) times daily as needed. ), Disp: , Rfl:  .  Liniments (SALONPAS PAIN RELIEF PATCH EX), Apply topically as needed., Disp: , Rfl:  .  MULTIPLE VITAMIN PO, Take 1 tablet by mouth daily. , Disp: , Rfl:  .  mupirocin cream (BACTROBAN) 2 %, Apply 1 application topically 2 (two) times daily. To affected area as needed, Disp: 15 g, Rfl: 0 .  OXYGEN, Inhale into the lungs at bedtime., Disp: , Rfl:  .  ranitidine (ZANTAC) 150 MG tablet, Take 1 tablet (150 mg total) by mouth daily after supper., Disp: 30 tablet, Rfl: 1 .  HYDROcodone-homatropine (HYCODAN) 5-1.5 MG/5ML syrup, Take 5 mLs by mouth at bedtime as needed for cough. (Patient not taking: Reported on 10/21/2015), Disp: 180 mL, Rfl: 0

## 2015-10-21 NOTE — Progress Notes (Signed)
Daily Session Note  Patient Details  Name: Destiny Rodriguez MRN: [id number] Date of Birth: 25-Jun-1924 Referring Provider:    Encounter Date: 10/21/2015  Check In:     Session Check In - 10/21/15 1338    Check-In   [facility]   Staff Present Rosebud Poles, RN, Luisa Hart, RN, BSN;Ramon Dredge, RN, MHA;Barrington Worley, MS, ACSM RCEP, Exercise Physiologist   Supervising physician immediately available to respond to emergencies [facility] immediately available   Physician(s) Dr. Marily Memos   Medication changes reported     No   Fall or balance concerns reported    No   Warm-up and Cool-down Performed as group-led instruction   Resistance Training Performed Yes   VAD Patient? No   Pain Assessment   Currently in Pain? No/denies   Multiple Pain Sites No      Capillary Blood Glucose: No results found for this or any previous visit (from the past 24 hour(s)).      Exercise Prescription Changes - 10/21/15 1500    Response to Exercise   Blood Pressure (Admit) 118/50 mmHg   Blood Pressure (Exercise) 122/66 mmHg   Blood Pressure (Exit) 98/60 mmHg   Heart Rate (Admit) 97 bpm   Heart Rate (Exercise) 118 bpm   Heart Rate (Exit) 95 bpm   Oxygen Saturation (Admit) 97 %   Oxygen Saturation (Exercise) 119 %   Oxygen Saturation (Exit) 95 %   Rating of Perceived Exertion (Exercise) 13   Perceived Dyspnea (Exercise) 3   Duration Progress to 45 minutes of aerobic exercise without signs/symptoms of physical distress   Intensity THRR unchanged   Progression   Progression Continue to progress workloads to maintain intensity without signs/symptoms of physical distress.   Resistance Training   Training Prescription Yes   Weight orange bands   Reps 10-12   Interval Training   Interval Training No   NuStep   Level 4   Minutes 15   METs 2.8   Arm Ergometer   Level 3   Minutes 15   Track   Laps 12   Minutes 15     Goals Met:  Exercise  tolerated well No report of cardiac concerns or symptoms Strength training completed today  Goals Unmet:  Not Applicable  Comments: Service time is from 1:30pm to 3:00pm    Dr. Rush Farmer is Medical Director for Pulmonary Rehab at [facility].

## 2015-10-21 NOTE — Assessment & Plan Note (Signed)
Despite her recent pneumonia, I don't feel that her colonization with MAC is causing an active infection as her symptoms have improved and her weight is stable right now.  Plan: Continue monitoring as we are doing If recurrent bronchiectasis flares this year then re-sample sputum for AFB and consider treatment

## 2015-10-23 ENCOUNTER — Encounter (HOSPITAL_COMMUNITY)
Admission: RE | Admit: 2015-10-23 | Discharge: 2015-10-23 | Disposition: A | Discharge status: Home / Self Care | Payer: Medicare Other | Source: Ambulatory Visit | Attending: Pulmonary Disease | Admitting: Pulmonary Disease

## 2015-10-23 VITALS — Wt 98.8 lb

## 2015-10-23 DIAGNOSIS — J449 Chronic obstructive pulmonary disease, unspecified: Secondary | ICD-10-CM | POA: Diagnosis not present

## 2015-10-23 DIAGNOSIS — J42 Unspecified chronic bronchitis: Secondary | ICD-10-CM

## 2015-10-23 NOTE — Progress Notes (Signed)
Daily Session Note  Patient Details  Name: Destiny Rodriguez MRN: [id number] Date of Birth: 04/23/1925 Referring Provider:    Encounter Date: 10/23/2015  Check In:     Session Check In - 10/23/15 1404    Check-In   [facility]   Staff Present Joan Behrens, RN, BSN; , RN;Portia Payne, RN, BSN;Annedrea Stackhouse, RN, MHA;Molly diVincenzo, MS, ACSM RCEP, Exercise Physiologist   Supervising physician immediately available to respond to emergencies [facility] immediately available   Physician(s) Dr. Ikram   Medication changes reported     No   Fall or balance concerns reported    No   Warm-up and Cool-down Performed as group-led instruction   Resistance Training Performed Yes   VAD Patient? No   Pain Assessment   Currently in Pain? No/denies   Multiple Pain Sites No      Capillary Blood Glucose: No results found for this or any previous visit (from the past 24 hour(s)).      Exercise Prescription Changes - 10/23/15 1600    Response to Exercise   Blood Pressure (Admit) 110/54 mmHg   Blood Pressure (Exercise) 114/60 mmHg   Blood Pressure (Exit) 104/60 mmHg   Heart Rate (Admit) 98 bpm   Heart Rate (Exercise) 115 bpm   Heart Rate (Exit) 84 bpm   Oxygen Saturation (Admit) 93 %   Oxygen Saturation (Exercise) 90 %   Oxygen Saturation (Exit) 93 %   Rating of Perceived Exertion (Exercise) 12   Perceived Dyspnea (Exercise) 2   Duration Progress to 45 minutes of aerobic exercise without signs/symptoms of physical distress   Intensity THRR unchanged   Resistance Training   Training Prescription Yes   Weight orange bands   Reps 10-12   Interval Training   Interval Training No   Arm Ergometer   Level 3   Minutes 15   Track   Laps 13   Minutes 15     Goals Met:  Exercise tolerated well No report of cardiac concerns or symptoms Strength training completed today  Goals Unmet:  Not Applicable  Comments: Service time is from 1330  to 1530     Dr. Wesam G. Yacoub is Medical Director for Pulmonary Rehab at [facility]. 

## 2015-10-28 ENCOUNTER — Encounter (HOSPITAL_COMMUNITY)
Admission: RE | Admit: 2015-10-28 | Discharge: 2015-10-28 | Disposition: A | Discharge status: Home / Self Care | Payer: Medicare Other | Source: Ambulatory Visit | Attending: Pulmonary Disease | Admitting: Pulmonary Disease

## 2015-10-28 VITALS — Wt 98.1 lb

## 2015-10-28 DIAGNOSIS — J449 Chronic obstructive pulmonary disease, unspecified: Secondary | ICD-10-CM | POA: Diagnosis not present

## 2015-10-28 DIAGNOSIS — J42 Unspecified chronic bronchitis: Secondary | ICD-10-CM

## 2015-10-28 NOTE — Progress Notes (Signed)
Daily Session Note  Patient Details  Name: Destiny Rodriguez MRN: [id number] Date of Birth: 1925/02/21 Referring Provider:    Encounter Date: 10/28/2015  Check In:     Session Check In - 10/28/15 1319    Check-In   [facility]   Staff Present Rosebud Poles, RN, BSN;Lisa Ysidro Evert, RN;Portia Rollene Rotunda, RN, BSN;Ramon Dredge, RN, MHA;Molly diVincenzo, MS, ACSM RCEP, Exercise Physiologist   Supervising physician immediately available to respond to emergencies [facility] immediately available   Physician(s) Dr. Marily Memos   Medication changes reported     No   Fall or balance concerns reported    No   Warm-up and Cool-down Performed as group-led instruction   Resistance Training Performed Yes   VAD Patient? No   Pain Assessment   Currently in Pain? No/denies   Multiple Pain Sites No      Capillary Blood Glucose: No results found for this or any previous visit (from the past 24 hour(s)).      Exercise Prescription Changes - 10/28/15 1500    Response to Exercise   Blood Pressure (Admit) 118/62 mmHg   Blood Pressure (Exercise) 126/72 mmHg   Blood Pressure (Exit) 114/40 mmHg   Heart Rate (Admit) 96 bpm   Heart Rate (Exercise) 118 bpm   Heart Rate (Exit) 98 bpm   Oxygen Saturation (Admit) 95 %   Oxygen Saturation (Exercise) 90 %   Oxygen Saturation (Exit) 90 %   Rating of Perceived Exertion (Exercise) 13   Perceived Dyspnea (Exercise) 1   Duration Progress to 45 minutes of aerobic exercise without signs/symptoms of physical distress   Intensity THRR unchanged   Progression   Progression Continue to progress workloads to maintain intensity without signs/symptoms of physical distress.   Resistance Training   Training Prescription Yes   Weight orange bands   Reps 10-12   Interval Training   Interval Training No   NuStep   Level 4   Minutes 15   METs 2.7   Arm Ergometer   Level 3   Minutes 15   Track   Laps 14   Minutes 15     Goals Met:   Exercise tolerated well Strength training completed today  Goals Unmet:  Not Applicable  Comments: Service time is from 1330 to 1500    Dr. Rush Farmer is Medical Director for Pulmonary Rehab at [facility].

## 2015-10-30 ENCOUNTER — Encounter (HOSPITAL_COMMUNITY)
Admission: RE | Admit: 2015-10-30 | Discharge: 2015-10-30 | Disposition: A | Discharge status: Home / Self Care | Payer: Medicare Other | Source: Ambulatory Visit | Attending: Pulmonary Disease | Admitting: Pulmonary Disease

## 2015-10-30 VITALS — Wt 97.2 lb

## 2015-10-30 DIAGNOSIS — J449 Chronic obstructive pulmonary disease, unspecified: Secondary | ICD-10-CM | POA: Diagnosis not present

## 2015-10-30 DIAGNOSIS — J42 Unspecified chronic bronchitis: Secondary | ICD-10-CM

## 2015-10-30 NOTE — Progress Notes (Signed)
Daily Session Note  Patient Details  Name: Destiny Rodriguez MRN: [id number] Date of Birth: 31-Oct-1924 Referring Provider:    Encounter Date: 10/30/2015  Check In:     Session Check In - 10/30/15 1345    Check-In   [facility]   Staff Present Rosebud Poles, RN, BSN;Molly diVincenzo, MS, ACSM RCEP, Exercise Physiologist;Portia Rollene Rotunda, RN, BSN   Supervising physician immediately available to respond to emergencies [facility] immediately available   Physician(s) Dr, Marily Memos   Medication changes reported     No   Fall or balance concerns reported    No   Warm-up and Cool-down Performed as group-led instruction   Resistance Training Performed Yes   VAD Patient? No   Pain Assessment   Currently in Pain? No/denies   Multiple Pain Sites No      Capillary Blood Glucose: No results found for this or any previous visit (from the past 24 hour(s)).      Exercise Prescription Changes - 10/30/15 1600    Response to Exercise   Blood Pressure (Admit) 108/54 mmHg   Blood Pressure (Exercise) 124/70 mmHg   Blood Pressure (Exit) 98/60 mmHg   Heart Rate (Admit) 92 bpm   Heart Rate (Exercise) 114 bpm   Heart Rate (Exit) 91 bpm   Oxygen Saturation (Admit) 94 %   Oxygen Saturation (Exercise) 90 %   Oxygen Saturation (Exit) 96 %   Rating of Perceived Exertion (Exercise) 13   Perceived Dyspnea (Exercise) 2   Duration Progress to 45 minutes of aerobic exercise without signs/symptoms of physical distress   Intensity THRR unchanged   Progression   Progression Continue to progress workloads to maintain intensity without signs/symptoms of physical distress.   Resistance Training   Training Prescription Yes   Weight orange bands   Reps 10-12   Interval Training   Interval Training No   NuStep   Level 4   Minutes 15   METs 2.7   Track   Laps 15   Minutes 15     Goals Met:  Exercise tolerated well No report of cardiac concerns or symptoms Strength training  completed today  Goals Unmet:  Not Applicable  Comments: Service time is from 1330 to 1500    Dr. Rush Farmer is Medical Director for Pulmonary Rehab at [facility].

## 2015-10-31 DIAGNOSIS — H903 Sensorineural hearing loss, bilateral: Secondary | ICD-10-CM | POA: Diagnosis not present

## 2015-11-04 ENCOUNTER — Encounter (HOSPITAL_COMMUNITY)
Admission: RE | Admit: 2015-11-04 | Discharge: 2015-11-04 | Disposition: A | Discharge status: Home / Self Care | Payer: Medicare Other | Source: Ambulatory Visit | Attending: Pulmonary Disease | Admitting: Pulmonary Disease

## 2015-11-04 DIAGNOSIS — J449 Chronic obstructive pulmonary disease, unspecified: Secondary | ICD-10-CM | POA: Diagnosis not present

## 2015-11-04 NOTE — Progress Notes (Signed)
Pulmonary Individual Treatment Plan  Patient Details  Name: Destiny Rodriguez MRN: [id number] Date of Birth: 02-26-1925 Referring Provider:    Initial Encounter Date:       Pulmonary Rehab Walk Test from 08/05/2015 in [place]   Date  08/05/15      Visit Diagnosis: No diagnosis found.  Patient's Home Medications on Admission:   Current outpatient prescriptions:  .  albuterol (PROVENTIL HFA;VENTOLIN HFA) 108 (90 BASE) MCG/ACT inhaler, Inhale 2 puffs into the lungs every 4 (four) hours as needed for wheezing or shortness of breath., Disp: 1 Inhaler, Rfl: 3 .  albuterol (PROVENTIL) (2.5 MG/3ML) 0.083% nebulizer solution, Take 3 mLs (2.5 mg total) by nebulization every 6 (six) hours as needed for wheezing or shortness of breath., Disp: 360 mL, Rfl: 11 .  Biotin 1000 MCG tablet, Take 1,000 mcg by mouth daily., Disp: , Rfl:  .  budesonide-formoterol (SYMBICORT) 80-4.5 MCG/ACT inhaler, Inhale 2 puffs into the lungs 2 (two) times daily., Disp: 1 Inhaler, Rfl: 12 .  calcium carbonate (OS-CAL) 600 MG TABS, Take 600 mg by mouth 2 (two) times daily with a meal. , Disp: , Rfl:  .  chlorpheniramine (CHLOR-TRIMETON) 4 MG tablet, Take 4 mg by mouth at bedtime., Disp: , Rfl:  .  guaiFENesin (MUCINEX) 600 MG 12 hr tablet, Take 2 tablets (1,200 mg total) by mouth 2 (two) times daily. (Patient taking differently: Take 1,200 mg by mouth 2 (two) times daily as needed. ), Disp: , Rfl:  .  HYDROcodone-homatropine (HYCODAN) 5-1.5 MG/5ML syrup, Take 5 mLs by mouth at bedtime as needed for cough. (Patient not taking: Reported on 10/21/2015), Disp: 180 mL, Rfl: 0 .  Liniments (SALONPAS PAIN RELIEF PATCH EX), Apply topically as needed., Disp: , Rfl:  .  MULTIPLE VITAMIN PO, Take 1 tablet by mouth daily. , Disp: , Rfl:  .  mupirocin cream (BACTROBAN) 2 %, Apply 1 application topically 2 (two) times daily. To affected area as needed, Disp: 15 g, Rfl: 0 .  OXYGEN, Inhale into the lungs at  bedtime., Disp: , Rfl:  .  ranitidine (ZANTAC) 150 MG tablet, Take 1 tablet (150 mg total) by mouth daily after supper., Disp: 30 tablet, Rfl: 1  Past Medical History: Past Medical History  Diagnosis Date  . Allergic rhinitis   . Hyperlipidemia   . Chronic sinusitis   . Bronchiectasis   . Rheumatism   . MAC (mycobacterium avium-intracellulare complex)   . Migraine     Tobacco Use: History  Smoking status  . Never Smoker   Smokeless tobacco  . Never Used    Labs:     Recent Review Flowsheet Data    There is no flowsheet data to display.      Capillary Blood Glucose: No results found for: GLUCAP   ADL UCSD:   Pulmonary Function Assessment:     Pulmonary Function Assessment - 08/04/15 1435    Breath   Bilateral Breath Sounds Rhonchi;Rales  bases bilat   Shortness of Breath Yes      Exercise Target Goals:    Exercise Program Goal: Individual exercise prescription set with THRR, safety & activity barriers. Participant demonstrates ability to understand and report RPE using BORG scale, to self-measure pulse accurately, and to acknowledge the importance of the exercise prescription.  Exercise Prescription Goal: Starting with aerobic activity 30 plus minutes a day, 3 days per week for initial exercise prescription. Provide home exercise prescription and guidelines that participant acknowledges understanding  prior to discharge.  Activity Barriers & Risk Stratification:     Activity Barriers & Cardiac Risk Stratification - 08/04/15 1433    Activity Barriers & Cardiac Risk Stratification   Activity Barriers Shortness of Breath;Deconditioning;Back Problems  back pain limits her gardening      6 Minute Walk:     6 Minute Walk      08/05/15 1607       6 Minute Walk   Phase Initial     Distance 1011 feet     Walk Time 6 minutes     # of Rest Breaks 0     MPH 1.91     METS 2.47     RPE 12     Perceived Dyspnea  1     VO2 Peak 7.03     Symptoms No      Resting HR 99 bpm     Resting BP 112/70 mmHg     Max Ex. HR 112 bpm     Max Ex. BP 120/72 mmHg     2 Minute Post BP 112/60 mmHg     Interval HR   Baseline HR 99     1 Minute HR 112     2 Minute HR 108     3 Minute HR 107     4 Minute HR 106     5 Minute HR 108     6 Minute HR 110     2 Minute Post HR 103     Interval Heart Rate? Yes     Interval Oxygen   Interval Oxygen? --  room air     Pre/Post BP   Baseline BP 112/70 mmHg     6 Minute BP 120/72 mmHg     Pre/Post BP? Yes        Initial Exercise Prescription:     Initial Exercise Prescription - 08/06/15 0800    Date of Initial Exercise RX and Referring Provider   Date 08/05/15   Oxygen   Oxygen --  room air   Bike   Level 0.2   Minutes 15   METs 1.84   NuStep   Level 1   Minutes 15   METs 2   Track   Laps 9   Minutes 15   METs 2.04   Prescription Details   Frequency (times per week) 2   Duration Progress to 45 minutes of aerobic exercise without signs/symptoms of physical distress   Intensity   THRR 40-80% of Max Heartrate 53-106   Ratings of Perceived Exertion 11-13   Perceived Dyspnea 0-4   Resistance Training   Training Prescription Yes   Weight orange bands   Reps 10-12      Perform Capillary Blood Glucose checks as needed.  Exercise Prescription Changes:      Exercise Prescription Changes      08/19/15 1600 09/02/15 1500 09/04/15 1610 09/09/15 1500 09/16/15 1500   Exercise Review   Progression No No No     Response to Exercise   Blood Pressure (Admit) 132/60 mmHg 116/60 mmHg 122/58 mmHg 100/54 mmHg 110/52 mmHg   Blood Pressure (Exercise) 122/70 mmHg 110/60 mmHg 104/60 mmHg 122/56 mmHg 122/80 mmHg   Blood Pressure (Exit) 134/74 mmHg 94/60 mmHg 116/56 mmHg 102/52 mmHg 108/60 mmHg   Heart Rate (Admit) 96 bpm 93 bpm 98 bpm 95 bpm 92 bpm   Heart Rate (Exercise) 107 bpm 108 bpm 106 bpm 103 bpm 107 bpm   Heart Rate (Exit) 104  bpm 95 bpm 93 bpm 89 bpm 86 bpm   Oxygen Saturation (Admit) 94  % 95 % 95 % 92 % 93 %   Oxygen Saturation (Exercise) 92 % 93 % 91 % 92 % 90 %   Oxygen Saturation (Exit) 94 % 92 % 93 % 94 % 95 %   Rating of Perceived Exertion (Exercise) 13 13 12 13 13    Perceived Dyspnea (Exercise) 1 3 2 1 2    Symptoms left hip pain with biking, tried airdyne and recumbent bike       Comments will try other equipment to not aggravate hip pain       Duration Progress to 45 minutes of aerobic exercise without signs/symptoms of physical distress Progress to 45 minutes of aerobic exercise without signs/symptoms of physical distress Progress to 45 minutes of aerobic exercise without signs/symptoms of physical distress Progress to 45 minutes of aerobic exercise without signs/symptoms of physical distress Progress to 45 minutes of aerobic exercise without signs/symptoms of physical distress   Intensity Other (comment)  40-80% HRR    THRR unchanged   Progression   Progression Continue to progress workloads to maintain intensity without signs/symptoms of physical distress. Continue to progress workloads to maintain intensity without signs/symptoms of physical distress. Continue to progress workloads to maintain intensity without signs/symptoms of physical distress. Continue to progress workloads to maintain intensity without signs/symptoms of physical distress. Continue to progress workloads to maintain intensity without signs/symptoms of physical distress.   Resistance Training   Training Prescription Yes Yes Yes Yes Yes   Weight orange bands orange bands orange bands orange bands orange bands   Reps 10-12 10-12 10-12 10-12 10-12   Interval Training   Interval Training No No No No No   Bike   Level 0.2       Minutes 15       NuStep   Level 1 1 1 1     Minutes 7 15 15 15     METs  1.3 1.6 1.6    Arm Ergometer   Level 1 1 1 1 1    Minutes 15 15 15 15 15    Track   Laps 7 11 11 10 10    Minutes 15 15 15 15 15      09/18/15 1500 09/23/15 1500 09/25/15 1600 09/30/15 1500 10/02/15 1600    Exercise Review   Progression Yes   Yes    Response to Exercise   Blood Pressure (Admit) 102/52 mmHg 102/60 mmHg 102/58 mmHg 108/50 mmHg 120/70 mmHg   Blood Pressure (Exercise) 104/60 mmHg 110/66 mmHg 112/66 mmHg 118/60 mmHg 110/60 mmHg   Blood Pressure (Exit) 102/64 mmHg 102/56 mmHg 114/60 mmHg 96/60 mmHg 100/50 mmHg   Heart Rate (Admit) 94 bpm 89 bpm 92 bpm 89 bpm 93 bpm   Heart Rate (Exercise) 116 bpm 114 bpm 112 bpm 120 bpm 120 bpm   Heart Rate (Exit) 99 bpm 94 bpm 94 bpm 95 bpm 65 bpm   Oxygen Saturation (Admit) 94 % 96 % 94 % 93 % 93 %   Oxygen Saturation (Exercise) 91 % 92 % 93 % 90 % 91 %   Oxygen Saturation (Exit) 93 % 92 % 91 % 95 % 99 %   Rating of Perceived Exertion (Exercise) 13 13 13 13 11    Perceived Dyspnea (Exercise) 1 1 1 1 1    Comments  reviewed home exercise prescription      Duration Progress to 45 minutes of aerobic exercise without signs/symptoms of physical distress Progress  to 45 minutes of aerobic exercise without signs/symptoms of physical distress Progress to 45 minutes of aerobic exercise without signs/symptoms of physical distress Progress to 45 minutes of aerobic exercise without signs/symptoms of physical distress Progress to 45 minutes of aerobic exercise without signs/symptoms of physical distress   Intensity THRR unchanged THRR unchanged THRR unchanged THRR unchanged THRR unchanged   Progression   Progression Continue to progress workloads to maintain intensity without signs/symptoms of physical distress. Continue to progress workloads to maintain intensity without signs/symptoms of physical distress. Continue to progress workloads to maintain intensity without signs/symptoms of physical distress. Continue to progress workloads to maintain intensity without signs/symptoms of physical distress. Continue to progress workloads to maintain intensity without signs/symptoms of physical distress.   Resistance Training   Training Prescription Yes Yes Yes Yes Yes    Weight orange bands orange bands orange bands orange bands orange bands   Reps 10-12 10-12 10-12 10-12 10-12   Interval Training   Interval Training No No No No No   NuStep   Level 3 3  4 4    Minutes 15 15  15 15    METs    2.6 2.7   Arm Ergometer   Level 2 2 2 2     Minutes 15 15 15 15     Track   Laps 12 8 13 12 13    Minutes 15 15 15 15 15      10/07/15 1536 10/09/15 1600 10/14/15 1500 10/16/15 1500 10/21/15 1500   Exercise Review   Progression   Yes     Response to Exercise   Blood Pressure (Admit) 122/62 mmHg 118/60 mmHg 100/60 mmHg 104/56 mmHg 118/50 mmHg   Blood Pressure (Exercise) 110/60 mmHg 116/67 mmHg 110/60 mmHg 140/60 mmHg 122/66 mmHg   Blood Pressure (Exit) 118/60 mmHg 110/60 mmHg 108/62 mmHg 96/50 mmHg 98/60 mmHg   Heart Rate (Admit) 99 bpm 103 bpm 92 bpm 92 bpm 97 bpm   Heart Rate (Exercise) 118 bpm 114 bpm 108 bpm 104 bpm 118 bpm   Heart Rate (Exit) 96 bpm 98 bpm 95 bpm 98 bpm 95 bpm   Oxygen Saturation (Admit) 97 % 93 % 94 % 92 % 97 %   Oxygen Saturation (Exercise) 92 % 93 % 91 % 93 % 119 %   Oxygen Saturation (Exit) 94 % 93 % 95 % 92 % 95 %   Rating of Perceived Exertion (Exercise) 13 15 13 13 13    Perceived Dyspnea (Exercise) 1 1 2 1 3    Duration Progress to 45 minutes of aerobic exercise without signs/symptoms of physical distress Progress to 45 minutes of aerobic exercise without signs/symptoms of physical distress Progress to 45 minutes of aerobic exercise without signs/symptoms of physical distress Progress to 45 minutes of aerobic exercise without signs/symptoms of physical distress Progress to 45 minutes of aerobic exercise without signs/symptoms of physical distress   Intensity THRR unchanged THRR unchanged THRR unchanged THRR unchanged THRR unchanged   Progression   Progression Continue to progress workloads to maintain intensity without signs/symptoms of physical distress. Continue to progress workloads to maintain intensity without signs/symptoms of physical  distress. Continue to progress workloads to maintain intensity without signs/symptoms of physical distress. Continue to progress workloads to maintain intensity without signs/symptoms of physical distress. Continue to progress workloads to maintain intensity without signs/symptoms of physical distress.   Resistance Training   Training Prescription Yes Yes Yes Yes Yes   Weight orange bands orange bands orange bands orange bands orange bands   Reps  10-12 10-12 10-12 10-12 10-12   Interval Training   Interval Training No No No No No   NuStep   Level 4  4 4 4    Minutes 15  15 15 15    METs 2.8  2.6 2.8 2.8   Arm Ergometer   Level 2 2 3 3 3    Minutes 15 15 15 15 15    Track   Laps 12 11 11  12    Minutes 15 15 15  15      10/23/15 1600 10/28/15 1500 10/30/15 1600 11/04/15 1500     Response to Exercise   Blood Pressure (Admit) 110/54 mmHg 118/62 mmHg 108/54 mmHg 104/60 mmHg    Blood Pressure (Exercise) 114/60 mmHg 126/72 mmHg 124/70 mmHg 110/62 mmHg    Blood Pressure (Exit) 104/60 mmHg 114/40 mmHg 98/60 mmHg 96/60 mmHg    Heart Rate (Admit) 98 bpm 96 bpm 92 bpm 96 bpm    Heart Rate (Exercise) 115 bpm 118 bpm 114 bpm 115 bpm    Heart Rate (Exit) 84 bpm 98 bpm 91 bpm 93 bpm    Oxygen Saturation (Admit) 93 % 95 % 94 % 94 %    Oxygen Saturation (Exercise) 90 % 90 % 90 % 90 %    Oxygen Saturation (Exit) 93 % 90 % 96 % 93 %    Rating of Perceived Exertion (Exercise) 12 13 13 15     Perceived Dyspnea (Exercise) 2 1 2 2     Duration Progress to 45 minutes of aerobic exercise without signs/symptoms of physical distress Progress to 45 minutes of aerobic exercise without signs/symptoms of physical distress Progress to 45 minutes of aerobic exercise without signs/symptoms of physical distress Progress to 45 minutes of aerobic exercise without signs/symptoms of physical distress    Intensity THRR unchanged THRR unchanged THRR unchanged THRR unchanged    Progression   Progression  Continue to progress  workloads to maintain intensity without signs/symptoms of physical distress. Continue to progress workloads to maintain intensity without signs/symptoms of physical distress. Continue to progress workloads to maintain intensity without signs/symptoms of physical distress.    Resistance Training   Training Prescription Yes Yes Yes Yes    Weight orange bands orange bands orange bands orange bands    Reps 10-12 10-12 10-12 10-12    Interval Training   Interval Training No No No No    NuStep   Level  4 4 4     Minutes  15 15 15     METs  2.7 2.7 2.1    Arm Ergometer   Level 3 3  3     Minutes 15 15  15     Track   Laps 13 14 15 11     Minutes 15 15 15 15        Exercise Comments:      Exercise Comments      09/11/15 0845 10/09/15 0947 11/06/15 0844       Exercise Comments Patient is cont. to progress exercise tolerance. Will continue to monitor progression. Patient is cont. to progress exercise tolerance. Will continue to monitor progression. Patient is cont. to progress exercise intensity. Has increased MET level from 1.3 to 2.8. Will cont. to monitor.Marland Kitchen        Discharge Exercise Prescription (Final Exercise Prescription Changes):     Exercise Prescription Changes - 11/04/15 1500    Response to Exercise   Blood Pressure (Admit) 104/60 mmHg   Blood Pressure (Exercise) 110/62 mmHg   Blood Pressure (Exit) 96/60 mmHg  Heart Rate (Admit) 96 bpm   Heart Rate (Exercise) 115 bpm   Heart Rate (Exit) 93 bpm   Oxygen Saturation (Admit) 94 %   Oxygen Saturation (Exercise) 90 %   Oxygen Saturation (Exit) 93 %   Rating of Perceived Exertion (Exercise) 15   Perceived Dyspnea (Exercise) 2   Duration Progress to 45 minutes of aerobic exercise without signs/symptoms of physical distress   Intensity THRR unchanged   Progression   Progression Continue to progress workloads to maintain intensity without signs/symptoms of physical distress.   Resistance Training   Training Prescription Yes    Weight orange bands   Reps 10-12   Interval Training   Interval Training No   NuStep   Level 4   Minutes 15   METs 2.1   Arm Ergometer   Level 3   Minutes 15   Track   Laps 11   Minutes 15       Nutrition:  Target Goals: Understanding of nutrition guidelines, daily intake of sodium <1540m, cholesterol <2045m calories 30% from fat and 7% or less from saturated fats, daily to have 5 or more servings of fruits and vegetables.  Biometrics:     Pre Biometrics - 08/04/15 1414    Pre Biometrics   Grip Strength 23 kg       Nutrition Therapy Plan and Nutrition Goals:     Nutrition Therapy & Goals - 09/16/15 1512    Nutrition Therapy   Diet High Calorie, High Protein   Personal Nutrition Goals   Personal Goal #1 0.5-2 lb wt gain to a goal wt of 100-105 lb at graduation from [facility] and counsel regarding individualized specific dietary modifications aiming towards targeted core components such as weight, hypertension, lipid management, diabetes, heart failure and other comorbidities.   Expected Outcomes Short Term Goal: Understand basic principles of dietary content, such as calories, fat, sodium, cholesterol and nutrients.;Long Term Goal: Adherence to prescribed nutrition plan.      Nutrition Discharge: Rate Your Plate Scores:     Nutrition Assessments - 08/14/15 1529    Rate Your Plate Scores   Pre Score 63      Psychosocial: Target Goals: Acknowledge presence or absence of depression, maximize coping skills, provide positive support system. Participant is able to verbalize types and ability to use techniques and skills needed for reducing stress and depression.  Initial Review & Psychosocial Screening:     Initial Psych Review & Screening - 08/04/15 [place]   Barriers   Psychosocial barriers to participate in program There are no identifiable barriers or  psychosocial needs.;The patient should benefit from training in stress management and relaxation.   Screening Interventions   Interventions Encouraged to exercise      Quality of Life Scores:   PHQ-9:     Recent Review Flowsheet Data    [facility]2015 08/04/2015 03/06/2015 07/25/2014 04/25/2014   Decreased Interest 0 1 0 0 0   Down, Depressed, Hopeless 0 0 0 0 0   PHQ - 2 Score 0 1 0 0 0      Psychosocial Evaluation and Intervention:   Psychosocial Re-Evaluation:     Psychosocial Re-Evaluation      08/21/15 1618 10/07/15 1630 10/28/15 1138       Psychosocial Re-Evaluation   Interventions Encouraged to attend Pulmonary Rehabilitation for the exercise Encouraged to attend  Pulmonary Rehabilitation for the exercise      Comments No psychosocial issues are identified at this time, encouraged to exercise in pulmonary rehab. --  continues to have no psychsocial issues that need to be addressed No psychosocial concerns have been identified.     Continued Psychosocial Services Needed Yes  reevaluate in 30 days for changes in psychosocial needs No No       Education: Education Goals: Education classes will be provided on a weekly basis, covering required topics. Participant will state understanding/return demonstration of topics presented.  Learning Barriers/Preferences:     Learning Barriers/Preferences - 08/04/15 1434    Learning Barriers/Preferences   Learning Barriers None   Learning Preferences Individual Instruction      Education Topics: Risk Factor Reduction:  -Group instruction that is supported by a PowerPoint presentation. Instructor discusses the definition of a risk factor, different risk factors for pulmonary disease, and how the heart and lungs work together.     Nutrition for Pulmonary Patient:  -Group instruction provided by PowerPoint slides, verbal discussion, and written materials to support subject matter. The instructor gives an  explanation and review of healthy diet recommendations, which includes a discussion on weight management, recommendations for fruit and vegetable consumption, as well as protein, fluid, caffeine, fiber, sodium, sugar, and alcohol. Tips for eating when patients are short of breath are discussed.      PULMONARY REHAB OTHER RESPIRATORY from 10/30/2015 in [place]   Date  10/23/15   Educator  RD   Instruction Review Code  2- meets goals/outcomes      Pursed Lip Breathing:  -Group instruction that is supported by demonstration and informational handouts. Instructor discusses the benefits of pursed lip and diaphragmatic breathing and detailed demonstration on how to preform both.        PULMONARY REHAB OTHER RESPIRATORY from 10/16/2015 in [place]   Date  10/16/15   Educator  EP   Instruction Review Code  2- meets goals/outcomes      Oxygen Safety:  -Group instruction provided by PowerPoint, verbal discussion, and written material to support subject matter. There is an overview of "What is Oxygen" and "Why do we need it".  Instructor also reviews how to create a safe environment for oxygen use, the importance of using oxygen as prescribed, and the risks of noncompliance. There is a brief discussion on traveling with oxygen and resources the patient may utilize.   Oxygen Equipment:  -Group instruction provided by [facility] Staff utilizing handouts, written materials, and equipment demonstrations.   Signs and Symptoms:  -Group instruction provided by written material and verbal discussion to support subject matter. Warning signs and symptoms of infection, stroke, and heart attack are reviewed and when to call the physician/911 reinforced. Tips for preventing the spread of infection discussed.   Advanced Directives:  -Group instruction provided by verbal instruction and written material to support subject matter. Instructor  reviews Advanced Directive laws and proper instruction for filling out document.      PULMONARY REHAB OTHER RESPIRATORY from 10/30/2015 in [place]   Date  10/09/15   Educator  Jeanella Craze   Instruction Review Code  2- meets goals/outcomes      Pulmonary Video:  -Group video education that reviews the importance of medication and oxygen compliance, exercise, good nutrition, pulmonary hygiene, and pursed lip and diaphragmatic breathing for the pulmonary patient.      PULMONARY REHAB  OTHER RESPIRATORY from 10/30/2015 in [place]   Date  10/30/15   Instruction Review Code  2- meets goals/outcomes      Exercise for the Pulmonary Patient:  -Group instruction that is supported by a PowerPoint presentation. Instructor discusses benefits of exercise, core components of exercise, frequency, duration, and intensity of an exercise routine, importance of utilizing pulse oximetry during exercise, safety while exercising, and options of places to exercise outside of rehab.        PULMONARY REHAB OTHER RESPIRATORY from 10/02/2015 in [place]   Date  10/02/15   Educator  RN   Instruction Review Code  2- meets goals/outcomes      Pulmonary Medications:  -Verbally interactive group education provided by instructor with focus on inhaled medications and proper administration.          PULMONARY REHAB OTHER RESPIRATORY from 09/25/2015 in [place]   Educator  -- [facility]/ Pharmacist]   Instruction Review Code  2- meets goals/outcomes      Anatomy and Physiology of the Respiratory System and Intimacy:  -Group instruction provided by PowerPoint, verbal discussion, and written material to support subject matter. Instructor reviews respiratory cycle and anatomical components of the respiratory system and their functions. Instructor also reviews differences in obstructive  and restrictive respiratory diseases with examples of each. Intimacy, Sex, and Sexuality differences are reviewed with a discussion on how relationships can change when diagnosed with pulmonary disease. Common sexual concerns are reviewed.   Knowledge Questionnaire Score:   Core Components/Risk Factors/Patient Goals at Admission:     Personal Goals and Risk Factors at Admission - 08/21/15 1623    Core Components/Risk Factors/Patient Goals on Admission   Personal Goal Other Yes      Core Components/Risk Factors/Patient Goals Review:      Goals and Risk Factor Review      08/21/15 1625 09/16/15 1514 09/16/15 1516 10/07/15 1626 10/28/15 1132   Core Components/Risk Factors/Patient Goals Review   Personal Goals Review Increase Strength and Stamina;Develop more efficient breathing techniques such as purse lipped breathing and diaphragmatic breathing and practicing self-pacing with activity.;Improve shortness of breath with ADL's;Weight Management/Obesity Weight Management/Obesity Weight Management/Obesity  Pt is underweight.  Increase Strength and Stamina;Improve shortness of breath with ADL's   Review Florabel has only exercised one session in pulmonary rehab, we will assist her in gaining weight by giving written and verbal information in ways to do so.  We hope to increase her strength and stamina, improve her shortness of breath and develope more efficient breathing techniques with exercise in pulmonary rehab.   In approx 2-3 weeks we will address her home exercise instructions Pt has maintained her wt at this time. Pt educated re: High Calorie, High Protein diet. Suggestions for appetite stimulants given to pt for her to discuss with Dr. Graylon Good this week. Pt has maintained her wt at this time. Pt educated re: High Calorie, High Protein diet. Suggestions for appetite stimulants given to pt for her to discuss with Dr. Graylon Good this week. --  She has gained some strength and stamina and has noticed  having more energy doing ADL's at home.  No weight gain at this time, maintained her weight Her strength and stamina are improving and she is attending the exercise classes regularly, she is still experiencing shortness of breath with activities, but is able to demonstrate purse lip breathing correctly.She has only gained .5kg despite nutrition consult.  Expected Outcomes Slow, gradual weight gain along with increased strength and stamina.  Hopefully her SOB will decrease and her ADL's will become easier. Wt gain to a goal wt of 100-105 lb at graduation from Pulmonary Rehab Wt gain to a goal wt of 100-105 lb at graduation from Pulmonary Rehab --  Increased workloads will improve her strength and stamina and decrease her SOB That she continues to become stronger with an increase in workloads.      Core Components/Risk Factors/Patient Goals at Discharge (Final Review):      Goals and Risk Factor Review - 10/28/15 1132    Core Components/Risk Factors/Patient Goals Review   Personal Goals Review Increase Strength and Stamina;Improve shortness of breath with ADL's   Review Her strength and stamina are improving and she is attending the exercise classes regularly, she is still experiencing shortness of breath with activities, but is able to demonstrate purse lip breathing correctly.She has only gained .5kg despite nutrition consult.   Expected Outcomes That she continues to become stronger with an increase in workloads.      ITP Comments:   Comments: ITP REVIEW Pt is making expected progress toward personal goals after completing 20 sessions.   Recommend continued exercise, life style modification, education, and utilization of breathing techniques to increase stamina and strength and decrease shortness of breath with exertion.

## 2015-11-04 NOTE — Progress Notes (Signed)
Daily Session Note  Patient Details  Name: Destiny Rodriguez MRN: [id number] Date of Birth: 1924/12/12 Referring Provider:    Encounter Date: 11/04/2015  Check In:     Session Check In - 11/04/15 1340    Check-In   Location [facility]   Staff Present Rosebud Poles, RN, BSN;Molly diVincenzo, MS, ACSM RCEP, Exercise Physiologist;Portia Rollene Rotunda, RN, BSN;Lisa Ysidro Evert, Felipe Drone, RN, [facility]   Supervising physician immediately available to respond to emergencies [facility] immediately available   Physician(s) Dr, Marily Memos   Medication changes reported     No   Fall or balance concerns reported    No   Warm-up and Cool-down Performed as group-led instruction   Resistance Training Performed Yes   VAD Patient? No   Pain Assessment   Currently in Pain? No/denies   Multiple Pain Sites No      Capillary Blood Glucose: No results found for this or any previous visit (from the past 24 hour(s)).      Exercise Prescription Changes - 11/04/15 1500    Response to Exercise   Blood Pressure (Admit) 104/60 mmHg   Blood Pressure (Exercise) 110/62 mmHg   Blood Pressure (Exit) 96/60 mmHg   Heart Rate (Admit) 96 bpm   Heart Rate (Exercise) 115 bpm   Heart Rate (Exit) 93 bpm   Oxygen Saturation (Admit) 94 %   Oxygen Saturation (Exercise) 90 %   Oxygen Saturation (Exit) 93 %   Rating of Perceived Exertion (Exercise) 15   Perceived Dyspnea (Exercise) 2   Duration Progress to 45 minutes of aerobic exercise without signs/symptoms of physical distress   Intensity THRR unchanged   Progression   Progression Continue to progress workloads to maintain intensity without signs/symptoms of physical distress.   Resistance Training   Training Prescription Yes   Weight orange bands   Reps 10-12   Interval Training   Interval Training No   NuStep   Level 4   Minutes 15   METs 2.1   Arm Ergometer   Level 3   Minutes 15   Track   Laps 11   Minutes 15     Goals Met:   Improved SOB with ADL's Exercise tolerated well Strength training completed today  Goals Unmet:  Not Applicable  Comments: Service time is from 1330 to 1500    Dr. Rush Farmer is Medical Director for Pulmonary Rehab at [facility]

## 2015-11-05 ENCOUNTER — Encounter: Payer: Self-pay | Admitting: Pulmonary Disease

## 2015-11-05 NOTE — Telephone Encounter (Signed)
6.14.17 mychart e-mail from pt: Message     Received notice from [facility] that Medicare requires beneficiaries on oxygen be re-evaluated by prescribing doctor within 90 days prior to the recertification date of oxygen prescription. For me this date would be 01/14/2016. Having had an office visit on 10/21/15 I did not feel it necessary to make another appointment. This was not discussed but presuming you approve my continued use of night oxygen I would appreciate your office notifying [facility] of this re-certification.    Thank You, [place] [facility] and spoke with Jeani Hawking who reported that pt will need to be re-certified for her O2 prior to 8.24.17 > within 90 days prior to that date.  This does NOT require ONO testing for pt's nocturnal O2, only a face-to-face office visit stating that she is on oxygen, benefits from it and needs to continue it.  Per Jeani Hawking, the 5.30.17 ov w/ BQ will suffice but needs to include the aforementioned statements about pt's O2.  E-mail sent to pt  Dr Lake Bells, please advise if you would recommend pt being seen in the office again or if addending the note would be sufficient.  Thank you!

## 2015-11-06 ENCOUNTER — Encounter (HOSPITAL_COMMUNITY)
Admission: RE | Admit: 2015-11-06 | Discharge: 2015-11-06 | Disposition: A | Discharge status: Home / Self Care | Payer: Medicare Other | Source: Ambulatory Visit | Attending: Pulmonary Disease | Admitting: Pulmonary Disease

## 2015-11-06 VITALS — Wt 98.3 lb

## 2015-11-06 DIAGNOSIS — J42 Unspecified chronic bronchitis: Secondary | ICD-10-CM

## 2015-11-06 DIAGNOSIS — J449 Chronic obstructive pulmonary disease, unspecified: Secondary | ICD-10-CM | POA: Diagnosis not present

## 2015-11-06 NOTE — Progress Notes (Signed)
Daily Session Note  Patient Details  Name: Destiny Rodriguez MRN: [id number] Date of Birth: [birth year] Referring Provider:    Encounter Date: 11/06/2015  Check In:     Session Check In - 11/06/15 1350    Check-In   [facility]   Staff Present Su Hilt, MS, ACSM RCEP, Exercise Physiologist;Izekiel Flegel Leonia Reeves, RN, Luisa Hart, RN, BSN   Supervising physician immediately available to respond to emergencies [facility] immediately available   Physician(s) Dr. Waldron Labs   Medication changes reported     No   Fall or balance concerns reported    No   Warm-up and Cool-down Performed as group-led Location manager Performed Yes   VAD Patient? No   Pain Assessment   Currently in Pain? No/denies   Multiple Pain Sites No      Capillary Blood Glucose: No results found for this or any previous visit (from the past 24 hour(s)).      Exercise Prescription Changes - 11/06/15 1600    Response to Exercise   Blood Pressure (Admit) 102/54 mmHg   Blood Pressure (Exercise) 96/54 mmHg   Blood Pressure (Exit) 102/60 mmHg   Heart Rate (Admit) 85 bpm   Heart Rate (Exercise) 101 bpm   Heart Rate (Exit) 89 bpm   Oxygen Saturation (Admit) 94 %   Oxygen Saturation (Exercise) 91 %   Oxygen Saturation (Exit) 94 %   Rating of Perceived Exertion (Exercise) 13   Perceived Dyspnea (Exercise) 1   Duration Progress to 45 minutes of aerobic exercise without signs/symptoms of physical distress   Intensity THRR unchanged   Progression   Progression Continue to progress workloads to maintain intensity without signs/symptoms of physical distress.   Resistance Training   Training Prescription Yes   Weight orange bands   Reps 10-12   Interval Training   Interval Training No   NuStep   Level 4   Minutes 15   METs 2.2   Track   Laps 16   Minutes 15     Goals Met:  Independence with exercise equipment Improved SOB with ADL's Using PLB without  cueing & demonstrates good technique Exercise tolerated well Strength training completed today  Goals Unmet:  Not Applicable  Comments: Service time is from 1330 to 1550    Dr. Rush Farmer is Medical Director for Pulmonary Rehab at [facility].

## 2015-11-11 ENCOUNTER — Encounter (HOSPITAL_COMMUNITY)
Admission: RE | Admit: 2015-11-11 | Discharge: 2015-11-11 | Disposition: A | Discharge status: Home / Self Care | Payer: Medicare Other | Source: Ambulatory Visit | Attending: Pulmonary Disease | Admitting: Pulmonary Disease

## 2015-11-11 VITALS — Wt 97.0 lb

## 2015-11-11 DIAGNOSIS — J449 Chronic obstructive pulmonary disease, unspecified: Secondary | ICD-10-CM | POA: Diagnosis not present

## 2015-11-11 DIAGNOSIS — J42 Unspecified chronic bronchitis: Secondary | ICD-10-CM

## 2015-11-11 NOTE — Progress Notes (Signed)
Daily Session Note  Patient Details  Name: Destiny Rodriguez MRN: [id number] Date of Birth: [birth year] Referring Provider:    Encounter Date: 11/11/2015  Check In:     Session Check In - 11/11/15 1352    Check-In   [facility]   Staff Present Su Hilt, MS, ACSM RCEP, Exercise Physiologist;Gabrella Stroh Leonia Reeves, RN, Roque Cash, RN   Supervising physician immediately available to respond to emergencies [facility] immediately available   Physician(s) Dr. Marily Memos   Medication changes reported     No   Fall or balance concerns reported    No   Warm-up and Cool-down Performed as group-led instruction   Resistance Training Performed Yes   VAD Patient? No   Pain Assessment   Currently in Pain? No/denies   Multiple Pain Sites No      Capillary Blood Glucose: No results found for this or any previous visit (from the past 24 hour(s)).      Exercise Prescription Changes - 11/11/15 1600    Response to Exercise   Blood Pressure (Admit) 122/60 mmHg   Blood Pressure (Exercise) 122/76 mmHg   Blood Pressure (Exit) 96/50 mmHg   Heart Rate (Admit) 104 bpm   Heart Rate (Exercise) 117 bpm   Heart Rate (Exit) 99 bpm   Oxygen Saturation (Admit) 91 %   Oxygen Saturation (Exercise) 88 %   Oxygen Saturation (Exit) 99 %   Rating of Perceived Exertion (Exercise) 13   Perceived Dyspnea (Exercise) 1   Duration Progress to 45 minutes of aerobic exercise without signs/symptoms of physical distress   Intensity THRR unchanged   Progression   Progression Continue to progress workloads to maintain intensity without signs/symptoms of physical distress.   Resistance Training   Training Prescription Yes   Weight orange bands   Reps 10-12   Interval Training   Interval Training No   NuStep   Level 4   Minutes 15   METs 2.7   Arm Ergometer   Level 3   Minutes 15   Track   Laps 9   Minutes 15     Goals Met:  Improved SOB with ADL's Using PLB without  cueing & demonstrates good technique Exercise tolerated well Strength training completed today  Goals Unmet:  Not Applicable  Comments: Service time is from 1330 to 1505    Dr. Rush Farmer is Medical Director for Pulmonary Rehab at [facility].

## 2015-11-13 ENCOUNTER — Encounter (HOSPITAL_COMMUNITY)
Admission: RE | Admit: 2015-11-13 | Discharge: 2015-11-13 | Disposition: A | Discharge status: Home / Self Care | Payer: Medicare Other | Source: Ambulatory Visit | Attending: Pulmonary Disease | Admitting: Pulmonary Disease

## 2015-11-13 DIAGNOSIS — J449 Chronic obstructive pulmonary disease, unspecified: Secondary | ICD-10-CM | POA: Diagnosis not present

## 2015-11-13 NOTE — Progress Notes (Signed)
Daily Session Note  Patient Details  Name: Destiny Rodriguez MRN: [id number] Date of Birth: Jan 23, 1925 Referring Provider:    Encounter Date: 11/13/2015  Check In:     Session Check In - 11/13/15 1605    Check-In   [facility]   Staff Present Rosebud Poles, RN, BSN;Lisa Ysidro Evert, RN;Molly diVincenzo, MS, ACSM RCEP, Exercise Physiologist   Supervising physician immediately available to respond to emergencies [facility] immediately available   Physician(s) Dr. Marily Memos   Medication changes reported     No   Fall or balance concerns reported    No   Warm-up and Cool-down Performed as group-led instruction   Resistance Training Performed Yes   VAD Patient? No   Pain Assessment   Currently in Pain? No/denies   Multiple Pain Sites No      Capillary Blood Glucose: No results found for this or any previous visit (from the past 24 hour(s)).      Exercise Prescription Changes - 11/13/15 1600    Response to Exercise   Blood Pressure (Admit) 104/60 mmHg   Blood Pressure (Exercise) 130/60 mmHg   Blood Pressure (Exit) 96/50 mmHg   Heart Rate (Admit) 97 bpm   Heart Rate (Exercise) 118 bpm   Heart Rate (Exit) 98 bpm   Oxygen Saturation (Admit) 95 %   Oxygen Saturation (Exercise) 91 %   Oxygen Saturation (Exit) 94 %   Rating of Perceived Exertion (Exercise) 14   Perceived Dyspnea (Exercise) 3   Duration Progress to 45 minutes of aerobic exercise without signs/symptoms of physical distress   Intensity THRR unchanged   Progression   Progression Continue to progress workloads to maintain intensity without signs/symptoms of physical distress.   Resistance Training   Training Prescription Yes   Weight orange bands   Reps 10-12   Interval Training   Interval Training No   NuStep   Level 4   Minutes 15   METs 3.1   Arm Ergometer   Level 3   Minutes 15   Track   Laps 13   Minutes 15     Goals Met:  Improved SOB with ADL's Using PLB without  cueing & demonstrates good technique Exercise tolerated well Strength training completed today  Goals Unmet:  Not Applicable  Comments: Service time is from 1330 to 1500    Dr. Rush Farmer is Medical Director for Pulmonary Rehab at [facility].

## 2015-11-17 NOTE — Telephone Encounter (Signed)
Message from BQ: Message     OK by me to use the May 30 visit   Dr. Lake Bells, please advise on the addendum to the 5.30.17 ov >> needs to state that pt is currently using O2, is benefitting from the O2 and needs to continue using it.  Thank you.

## 2015-11-18 ENCOUNTER — Encounter (HOSPITAL_COMMUNITY)
Admission: RE | Admit: 2015-11-18 | Discharge: 2015-11-18 | Disposition: A | Discharge status: Home / Self Care | Payer: Medicare Other | Source: Ambulatory Visit | Attending: Pulmonary Disease | Admitting: Pulmonary Disease

## 2015-11-18 VITALS — Wt 97.9 lb

## 2015-11-18 DIAGNOSIS — J449 Chronic obstructive pulmonary disease, unspecified: Secondary | ICD-10-CM | POA: Diagnosis not present

## 2015-11-18 DIAGNOSIS — J42 Unspecified chronic bronchitis: Secondary | ICD-10-CM

## 2015-11-18 NOTE — Progress Notes (Signed)
Daily Session Note  Patient Details  Name: Destiny Rodriguez MRN: [id number] Date of Birth: 03/25/1925 Referring Provider:    Encounter Date: 11/18/2015  Check In:     Session Check In - 11/18/15 1350    Check-In   [facility]   Staff Present Joan Behrens, RN, BSN; , RN;Portia Payne, RN, BSN;Molly diVincenzo, MS, ACSM RCEP, Exercise Physiologist   Supervising physician immediately available to respond to emergencies [facility] immediately available   Medication changes reported     No   Fall or balance concerns reported    No   Warm-up and Cool-down Performed as group-led instruction   Resistance Training Performed Yes   VAD Patient? No   Pain Assessment   Currently in Pain? No/denies   Multiple Pain Sites No      Capillary Blood Glucose: No results found for this or any previous visit (from the past 24 hour(s)).      Exercise Prescription Changes - 11/18/15 1600    Response to Exercise   Blood Pressure (Admit) 110/60 mmHg   Blood Pressure (Exercise) 104/64 mmHg   Blood Pressure (Exit) 100/60 mmHg   Heart Rate (Admit) 96 bpm   Heart Rate (Exercise) 119 bpm   Heart Rate (Exit) 97 bpm   Oxygen Saturation (Admit) 93 %   Oxygen Saturation (Exercise) 89 %   Oxygen Saturation (Exit) 92 %   Rating of Perceived Exertion (Exercise) 13   Perceived Dyspnea (Exercise) 2   Duration Progress to 45 minutes of aerobic exercise without signs/symptoms of physical distress   Intensity THRR unchanged   Progression   Progression Continue to progress workloads to maintain intensity without signs/symptoms of physical distress.   Resistance Training   Training Prescription Yes   Weight orange bands   Reps 10-12   Interval Training   Interval Training No   NuStep   Level 4   Minutes 17   METs 2.7   Arm Ergometer   Level 3   Minutes 17   Track   Laps 13   Minutes 17     Goals Met:  Exercise tolerated well No report of cardiac  concerns or symptoms Strength training completed today  Goals Unmet:  Not Applicable  Comments: Service time is from 1330 to 1505    Dr. Wesam G. Yacoub is Medical Director for Pulmonary Rehab at [facility]. 

## 2015-11-20 ENCOUNTER — Encounter (HOSPITAL_COMMUNITY)
Admission: RE | Admit: 2015-11-20 | Discharge: 2015-11-20 | Disposition: A | Discharge status: Home / Self Care | Payer: Medicare Other | Source: Ambulatory Visit | Attending: Pulmonary Disease | Admitting: Pulmonary Disease

## 2015-11-20 DIAGNOSIS — J449 Chronic obstructive pulmonary disease, unspecified: Secondary | ICD-10-CM | POA: Diagnosis not present

## 2015-11-20 DIAGNOSIS — J42 Unspecified chronic bronchitis: Secondary | ICD-10-CM

## 2015-11-24 ENCOUNTER — Telehealth: Payer: Self-pay | Admitting: Pulmonary Disease

## 2015-11-24 MED ORDER — AZITHROMYCIN 250 MG PO TABS: 250.0000 mg | ORAL_TABLET | ORAL | Status: DC

## 2015-11-24 NOTE — Telephone Encounter (Signed)
Pt c/o chest tightness, cough and SOB x 3 days.  Pt reports having tan colored mucus that is thick - no blood Denies fever - has felt "feverish" and weak.  Pt requesting something be called into her pharmacy - reports that the last abx she was given in Jan 2017 was Zpak. States that she feels it worked some but she feels that she never completely got rid of the previous sickness. Pt refused appt for today.   Please advise Dr Melvyn Novas as Dr Lake Bells is not available today. Thanks.

## 2015-11-24 NOTE — Telephone Encounter (Signed)
zpak then ov in one week

## 2015-11-24 NOTE — Telephone Encounter (Signed)
Spoke with TP and made her aware of situation. Per verbal order from [place] to use 12/05/15 at 10am.  Called spoke with pt. I scheduled her with TP on 12/05/15 at 10am. She voiced understanding and had no further questions. Nothing further needed at this time.

## 2015-11-24 NOTE — Telephone Encounter (Signed)
Pt aware of rx being sent to [facility] sent Needs appt scheduled for 1 week with NP - Dr Lake Bells is not in office next week.  Please advise Estill Bamberg where we can work in this patient on TP schedule as [facility] has no openings at all. Thanks.

## 2015-11-25 ENCOUNTER — Encounter (HOSPITAL_COMMUNITY): Payer: Medicare Other

## 2015-11-27 ENCOUNTER — Encounter (HOSPITAL_COMMUNITY): Payer: Medicare Other

## 2015-11-27 DIAGNOSIS — J9611 Chronic respiratory failure with hypoxia: Secondary | ICD-10-CM | POA: Insufficient documentation

## 2015-11-27 NOTE — Assessment & Plan Note (Signed)
She continues to use and benefit from her nocturnal oxygen and will need to continue using it.

## 2015-11-28 NOTE — Telephone Encounter (Signed)
5.30.17 ov note has been addended by BQ Called [facility] and spoke with Maudie Mercury to very previous conversation with Jeani Hawking on 6.14.17 and per Maudie Mercury all of that information is correct 5.30.17 ov note has been printed and faxed to [place] @ [phone] E-mail sent to patient informing her this has been taken care of Nothing further needed at this point, will sign off

## 2015-12-02 ENCOUNTER — Encounter (HOSPITAL_COMMUNITY): Payer: Medicare Other

## 2015-12-04 ENCOUNTER — Encounter (HOSPITAL_COMMUNITY): Payer: Medicare Other

## 2015-12-05 ENCOUNTER — Ambulatory Visit (INDEPENDENT_AMBULATORY_CARE_PROVIDER_SITE_OTHER): Payer: Medicare Other | Admitting: Adult Health

## 2015-12-05 ENCOUNTER — Encounter: Payer: Self-pay | Admitting: Adult Health

## 2015-12-05 VITALS — BP 104/62 | HR 84 | Temp 97.8°F | Ht 64.0 in | Wt 97.0 lb

## 2015-12-05 DIAGNOSIS — J471 Bronchiectasis with (acute) exacerbation: Secondary | ICD-10-CM | POA: Diagnosis not present

## 2015-12-05 NOTE — Progress Notes (Signed)
Subjective:    Patient ID: Destiny Rodriguez, female    DOB: 1924/09/07, 82 y.o.   MRN: [id number]  HPI 82 yo female pt with COPD, bronchiectasis  and MAC  Info/TEST /Events   She does better with longer courses of antibiotics (14 days) when she has flare ups. Rx with clarithro/ethamb 2000-2001 Started clarithro/ethamb/rif 08/2010  >  finished 105mos of treatment ending May 2014.  She experienced severe muscle cramps due to antibiotics.   CT chest 04/2011: large cavitary disease superimposed over bronchiectasis.  Started on MAC meds per ID 05/2011 CT chest 12/20/12 >Bronchiectasis, pulmonary parenchymal cysts,   A collapsed cyst in the left upper lobe with associated presumed pulmonary parenchymal scarring  Uses therapy vest twice a day. Spirometry 2010: FEV1 1.26 (71%), ratio 65. Good response to LABA/ICS AUGUST 2016 Sputum> pseudomonas, cipro sensitive August 2016 Sputum > MAC positive x2 Had pneumonia in March 2017. Refer to pulmonary rehabilitation in April 2017.  12/05/2015 Follow up : Bronchiectasis  Pt presents for a follow up visit for Bronchectasis ,. Called in 2 weeks ago with  chest congestion/tightness, sinus pressure/drainage, prod cough white colored mucus. Was called in [place] last week, feels she is getting better. . Cough and congestion are now clear. Denies any wheezing/SOB, fever, nausea or vomiting. Remains on Symbicort Twice daily  .  Uses VEST Twice daily  Along with flutter.      Past Medical History  Diagnosis Date  . Allergic rhinitis   . Hyperlipidemia   . Chronic sinusitis   . Bronchiectasis   . Rheumatism   . MAC (mycobacterium avium-intracellulare complex)   . Migraine    Current Outpatient Prescriptions on File Prior to Visit  Medication Sig Dispense Refill  . albuterol (PROVENTIL HFA;VENTOLIN HFA) 108 (90 BASE) MCG/ACT inhaler Inhale 2 puffs into the lungs every 4 (four) hours as needed for wheezing or shortness of breath. 1 Inhaler 3  . albuterol  (PROVENTIL) (2.5 MG/3ML) 0.083% nebulizer solution Take 3 mLs (2.5 mg total) by nebulization every 6 (six) hours as needed for wheezing or shortness of breath. 360 mL 11  . Biotin 1000 MCG tablet Take 1,000 mcg by mouth daily.    . budesonide-formoterol (SYMBICORT) 80-4.5 MCG/ACT inhaler Inhale 2 puffs into the lungs 2 (two) times daily. 1 Inhaler 12  . calcium carbonate (OS-CAL) 600 MG TABS Take 600 mg by mouth 2 (two) times daily with a meal.     . chlorpheniramine (CHLOR-TRIMETON) 4 MG tablet Take 4 mg by mouth at bedtime.    Marland Kitchen guaiFENesin (MUCINEX) 600 MG 12 hr tablet Take 2 tablets (1,200 mg total) by mouth 2 (two) times daily. (Patient taking differently: Take 1,200 mg by mouth 2 (two) times daily as needed. )    . HYDROcodone-homatropine (HYCODAN) 5-1.5 MG/5ML syrup Take 5 mLs by mouth at bedtime as needed for cough. 180 mL 0  . MULTIPLE VITAMIN PO Take 1 tablet by mouth daily.     . mupirocin cream (BACTROBAN) 2 % Apply 1 application topically 2 (two) times daily. To affected area as needed 15 g 0  . OXYGEN Inhale into the lungs at bedtime.    . ranitidine (ZANTAC) 150 MG tablet Take 1 tablet (150 mg total) by mouth daily after supper. 30 tablet 1  . Liniments (SALONPAS PAIN RELIEF PATCH EX) Apply topically as needed. Reported on 12/05/2015     No current facility-administered medications on file prior to visit.     Review of Systems Constitutional:  No  weight loss, night sweats,  Fevers, chills, fatigue, or  lassitude.  HEENT:   No headaches,  Difficulty swallowing,  Tooth/dental problems, or  Sore throat,                No sneezing, itching, ear ache,  +nasal congestion, post nasal drip,   CV:  No chest pain,  Orthopnea, PND, swelling in lower extremities, anasarca, dizziness, palpitations, syncope.   GI  No heartburn, indigestion, abdominal pain, nausea, vomiting, diarrhea, change in bowel habits, loss of appetite, bloody stools.   Resp:    No chest wall deformity  Skin: no  rash or lesions.  GU: no dysuria, change in color of urine, no urgency or frequency.  No flank pain, no hematuria   MS:  No joint pain or swelling.  No decreased range of motion.  No back pain.  Psych:  No change in mood or affect. No depression or anxiety.  No memory loss.         Objective:   Physical Exam Filed Vitals:   12/05/15 1004  BP: 104/62  Pulse: 84  Temp: 97.8 F (36.6 C)  TempSrc: Oral  Height: 5\' 4"  (1.626 m)  Weight: 97 lb (43.999 kg)  SpO2: 96%   GEN: A/Ox3; pleasant , NAD, elderly and frail   HEENT:  /AT,  EACs-clear, TMs-wnl, NOSE-clear, THROAT-clear, no lesions, no postnasal drip or exudate noted.   NECK:  Supple w/ fair ROM; no JVD; normal carotid impulses w/o bruits; no thyromegaly or nodules palpated; no lymphadenopathy.  RESP  Few trace rhonchi , no accessory muscle use, no dullness to percussion  CARD:  RRR, no m/r/g  , no peripheral edema, pulses intact, no cyanosis or clubbing.  GI:   Soft & nt; nml bowel sounds; no organomegaly or masses detected.  Musco: Warm bil, no deformities or joint swelling noted.   Neuro: alert, no focal deficits noted.    Skin: Warm, no lesions or rashes  Zafar Debrosse NP-C  [facility]  12/05/2015        Assessment & Plan:

## 2015-12-05 NOTE — Patient Instructions (Addendum)
Continue on the vest and flutter valve  Mucinex as needed Continue on Symbicort 2 puffs twice daily, Rinse after use Follow up in September with Dr. Lake Bells as planned and as needed

## 2015-12-05 NOTE — Assessment & Plan Note (Signed)
Recent flare now resolving  Hold on additional abx at this time   Plan  Continue on the vest and flutter valve  Mucinex as needed Continue on Symbicort 2 puffs twice daily, Rinse after use Follow up in September with Dr. Lake Bells as planned and as needed

## 2015-12-08 NOTE — Progress Notes (Signed)
Reviewed, agree 

## 2015-12-09 ENCOUNTER — Encounter (HOSPITAL_COMMUNITY): Payer: Medicare Other

## 2015-12-09 DIAGNOSIS — J449 Chronic obstructive pulmonary disease, unspecified: Secondary | ICD-10-CM | POA: Insufficient documentation

## 2015-12-11 ENCOUNTER — Encounter (HOSPITAL_COMMUNITY)
Admission: RE | Admit: 2015-12-11 | Discharge: 2015-12-11 | Disposition: A | Discharge status: Home / Self Care | Payer: Self-pay | Source: Ambulatory Visit | Attending: Pulmonary Disease | Admitting: Pulmonary Disease

## 2015-12-16 ENCOUNTER — Encounter (HOSPITAL_COMMUNITY)
Admission: RE | Admit: 2015-12-16 | Discharge: 2015-12-16 | Disposition: A | Discharge status: Home / Self Care | Payer: Self-pay | Source: Ambulatory Visit | Attending: Pulmonary Disease | Admitting: Pulmonary Disease

## 2015-12-17 ENCOUNTER — Telehealth: Payer: Self-pay | Admitting: Pulmonary Disease

## 2015-12-17 NOTE — Telephone Encounter (Signed)
lmtcb x1 for pt. 

## 2015-12-18 ENCOUNTER — Encounter (HOSPITAL_COMMUNITY)
Admission: RE | Admit: 2015-12-18 | Discharge: 2015-12-18 | Disposition: A | Discharge status: Home / Self Care | Payer: Self-pay | Source: Ambulatory Visit | Attending: Pulmonary Disease | Admitting: Pulmonary Disease

## 2015-12-18 NOTE — Telephone Encounter (Signed)
Called and spoke with pt and she is needing a new vortex holding chamber to use for her medications/inhalers. Pt stated she does not need the vortex, she just needs a chamber to use since her mouth piece is broken.  BQ please advise. thanks

## 2015-12-19 NOTE — Telephone Encounter (Signed)
Pt has located the item and she no longer needs [place] to help her get one. Pharm says she does not need a rx for it.

## 2015-12-19 NOTE — Telephone Encounter (Signed)
atc pt to verify that nothing further is needed from our office- atc X2, line rang to fast busy signal.  Wcb.

## 2015-12-22 ENCOUNTER — Ambulatory Visit (INDEPENDENT_AMBULATORY_CARE_PROVIDER_SITE_OTHER): Payer: Medicare Other | Admitting: Internal Medicine

## 2015-12-22 VITALS — BP 134/71 | HR 96 | Temp 97.4°F | Wt 97.0 lb

## 2015-12-22 DIAGNOSIS — A310 Pulmonary mycobacterial infection: Secondary | ICD-10-CM

## 2015-12-22 DIAGNOSIS — J479 Bronchiectasis, uncomplicated: Secondary | ICD-10-CM | POA: Diagnosis not present

## 2015-12-22 NOTE — Progress Notes (Signed)
RFV: follow up for bronchiectasis and pulmonary MAC Subjective:    Patient ID: [name], female    DOB: Mar 17, 1925, 82 y.o.   MRN: [id number]  HPI  Mrs Rumberger is an 82yo F with history of bronchiectasis, and pulmonary MAC. Last treated for exacerbation with azithromycin in early July by Dr. Anastasia Pall office. She continues to participate and finds improved endurance with pulmonary rehab. She states that she has minimal productive cough. She notices improvement in distance she can ambulate before she becomes short of breath.  Allergies  Allergen Reactions  . Tequin Rash   Current Outpatient Prescriptions on File Prior to Visit  Medication Sig Dispense Refill  . albuterol (PROVENTIL HFA;VENTOLIN HFA) 108 (90 BASE) MCG/ACT inhaler Inhale 2 puffs into the lungs every 4 (four) hours as needed for wheezing or shortness of breath. 1 Inhaler 3  . albuterol (PROVENTIL) (2.5 MG/3ML) 0.083% nebulizer solution Take 3 mLs (2.5 mg total) by nebulization every 6 (six) hours as needed for wheezing or shortness of breath. 360 mL 11  . Biotin 1000 MCG tablet Take 1,000 mcg by mouth daily.    . budesonide-formoterol (SYMBICORT) 80-4.5 MCG/ACT inhaler Inhale 2 puffs into the lungs 2 (two) times daily. 1 Inhaler 12  . calcium carbonate (OS-CAL) 600 MG TABS Take 600 mg by mouth 2 (two) times daily with a meal.     . chlorpheniramine (CHLOR-TRIMETON) 4 MG tablet Take 4 mg by mouth at bedtime.    Marland Kitchen guaiFENesin (MUCINEX) 600 MG 12 hr tablet Take 2 tablets (1,200 mg total) by mouth 2 (two) times daily. (Patient taking differently: Take 1,200 mg by mouth 2 (two) times daily as needed. )    . HYDROcodone-homatropine (HYCODAN) 5-1.5 MG/5ML syrup Take 5 mLs by mouth at bedtime as needed for cough. 180 mL 0  . Liniments (SALONPAS PAIN RELIEF PATCH EX) Apply topically as needed. Reported on 12/05/2015    . MULTIPLE VITAMIN PO Take 1 tablet by mouth daily.     . mupirocin cream (BACTROBAN) 2 % Apply 1 application  topically 2 (two) times daily. To affected area as needed 15 g 0  . OXYGEN Inhale into the lungs at bedtime.    . ranitidine (ZANTAC) 150 MG tablet Take 1 tablet (150 mg total) by mouth daily after supper. 30 tablet 1   No current facility-administered medications on file prior to visit.    Active Ambulatory Problems    Diagnosis Date Noted  . Mycobacterium avium-intracellulare complex ([place]) 06/07/2008  . HYPERLIPIDEMIA 03/25/2007  . SINUSITIS, CHRONIC 06/07/2008  . ALLERGIC RHINITIS 06/07/2008  . Bronchiectasis without acute exacerbation ([place]) 03/25/2007  . COPD (chronic obstructive pulmonary disease) with chronic bronchitis ([place]) 06/07/2008  . HIATAL HERNIA 03/25/2007  . Black stool 11/23/2010  . Pneumonia 12/06/2013  . Hemoptysis 12/07/2013  . Chronic respiratory failure with hypoxia ([place]) 11/27/2015   Resolved Ambulatory Problems    Diagnosis Date Noted  . UNSPECIFIED BACTERIAL PNEUMONIA 04/01/2010  . Hemoptysis 10/15/2008  . HEMOPTYSIS UNSPECIFIED 04/01/2010  . Hemoptysis 11/23/2010  . MAC (mycobacterium avium-intracellulare complex) 05/27/2011   Past Medical History:  Diagnosis Date  . Allergic rhinitis   . Bronchiectasis   . Chronic sinusitis   . Hyperlipidemia   . MAC (mycobacterium avium-intracellulare complex)   . Migraine   . Rheumatism      Review of Systems Weight stable. Mild productive cough, her baseline. Otherwise 10 point ros is negative    Objective:   Physical Exam BP 134/71   Pulse  96   Temp 97.4 F (36.3 C) (Oral)   Wt 97 lb (44 kg)   BMI 16.65 kg/m  Physical Exam  Constitutional:  oriented to person, place, and time. appears well-developed and well-nourished. No distress.  HENT: [place]/AT, PERRLA, no scleral icterus Mouth/Throat: Oropharynx is clear and moist. No oropharyngeal exudate.  Cardiovascular: Normal rate, regular rhythm and normal heart sounds. Exam reveals no gallop and no friction rub.  No murmur heard.  Pulmonary/Chest: Effort  normal and breath sounds normal. No respiratory distress.  has no wheezes. Mild inspiratory crackles at bases Lymphadenopathy: no cervical adenopathy. No axillary adenopathy Neurological: alert and oriented to person, place, and time.  Skin: Skin is warm and dry. No rash noted. No erythema.  Psychiatric: a normal mood and affect.  behavior is normal.      Assessment & Plan:  Bronchiectasis = continue with her current pulmonary regimen including rehab  Hx of MAC = for now, no need to do screening sputum culture since she does not appear to be symptomatic to consider repeat treatment.  Health maintenance = recommend enhanced flu vaccine for 80yo+ this fall

## 2015-12-23 ENCOUNTER — Encounter (HOSPITAL_COMMUNITY)
Admission: RE | Admit: 2015-12-23 | Discharge: 2015-12-23 | Disposition: A | Discharge status: Home / Self Care | Payer: Self-pay | Source: Ambulatory Visit | Attending: Pulmonary Disease | Admitting: Pulmonary Disease

## 2015-12-23 DIAGNOSIS — J449 Chronic obstructive pulmonary disease, unspecified: Secondary | ICD-10-CM | POA: Insufficient documentation

## 2015-12-25 ENCOUNTER — Encounter (HOSPITAL_COMMUNITY)
Admission: RE | Admit: 2015-12-25 | Discharge: 2015-12-25 | Disposition: A | Discharge status: Home / Self Care | Payer: Self-pay | Source: Ambulatory Visit | Attending: Pulmonary Disease | Admitting: Pulmonary Disease

## 2015-12-30 ENCOUNTER — Encounter (HOSPITAL_COMMUNITY)
Admission: RE | Admit: 2015-12-30 | Discharge: 2015-12-30 | Disposition: A | Discharge status: Home / Self Care | Payer: Self-pay | Source: Ambulatory Visit | Attending: Pulmonary Disease | Admitting: Pulmonary Disease

## 2016-01-01 ENCOUNTER — Encounter (HOSPITAL_COMMUNITY)
Admission: RE | Admit: 2016-01-01 | Discharge: 2016-01-01 | Disposition: A | Discharge status: Home / Self Care | Payer: Self-pay | Source: Ambulatory Visit | Attending: Pulmonary Disease | Admitting: Pulmonary Disease

## 2016-01-06 ENCOUNTER — Encounter (HOSPITAL_COMMUNITY)
Admission: RE | Admit: 2016-01-06 | Discharge: 2016-01-06 | Disposition: A | Discharge status: Home / Self Care | Payer: Self-pay | Source: Ambulatory Visit | Attending: Pulmonary Disease | Admitting: Pulmonary Disease

## 2016-01-08 ENCOUNTER — Encounter (HOSPITAL_COMMUNITY)
Admission: RE | Admit: 2016-01-08 | Discharge: 2016-01-08 | Disposition: A | Discharge status: Home / Self Care | Payer: Self-pay | Source: Ambulatory Visit | Attending: Pulmonary Disease | Admitting: Pulmonary Disease

## 2016-01-08 NOTE — Progress Notes (Signed)
Pulmonary Rehab Discharge Note  Destiny Rodriguez graduated from pulmonary rehab as of 11/18/15.  She was a pleasure to have in the program.  She made great progress regarding strength and stamina, but has not been able to gain any weight to speak of.  She is trying to eat small frequent meals of high fat and high protein.  She is continuing her exercise presently in the maintenance pulmonary rehab program.

## 2016-01-08 NOTE — Addendum Note (Signed)
Encounter addended by: Lance Morin, RN on: 01/08/2016  8:34 AM<BR>    Actions taken: Sign clinical note, Episode resolved

## 2016-01-08 NOTE — Progress Notes (Signed)
Discharge Summary  Patient Details  Name: Destiny Rodriguez MRN: [id number] Date of Birth: Jan 13, 1925 Referring Provider:     Number of Visits: 23  Reason for Discharge:  Patient reached a stable level of exercise.  Smoking History:  History  Smoking Status  . Never Smoker  Smokeless Tobacco  . Never Used    Diagnosis:  Chronic bronchitis, unspecified chronic bronchitis type (Destiny Rodriguez)  ADL UCSD:     Pulmonary Assessment Scores    Row Name 11/18/15 1426         ADL UCSD   ADL Phase Exit     SOB Score total 51        Initial Exercise Prescription:     Initial Exercise Prescription - 08/06/15 0800      Date of Initial Exercise RX and Referring Provider   Date 08/05/15     Oxygen   Oxygen --  room air     Bike   Level 0.2   Minutes 15   METs 1.84     NuStep   Level 1   Minutes 15   METs 2     Track   Laps 9   Minutes 15   METs 2.04     Prescription Details   Frequency (times per week) 2   Duration Progress to 45 minutes of aerobic exercise without signs/symptoms of physical distress     Intensity   THRR 40-80% of Max Heartrate 53-106   Ratings of Perceived Exertion 11-13   Perceived Dyspnea 0-4     Resistance Training   Training Prescription Yes   Weight orange bands   Reps 10-12      Discharge Exercise Prescription (Final Exercise Prescription Changes):     Exercise Prescription Changes - 11/18/15 1600      Response to Exercise   Blood Pressure (Admit) 110/60   Blood Pressure (Exercise) 104/64   Blood Pressure (Exit) 100/60   Heart Rate (Admit) 96 bpm   Heart Rate (Exercise) 119 bpm   Heart Rate (Exit) 97 bpm   Oxygen Saturation (Admit) 93 %   Oxygen Saturation (Exercise) 89 %   Oxygen Saturation (Exit) 92 %   Rating of Perceived Exertion (Exercise) 13   Perceived Dyspnea (Exercise) 2   Duration Progress to 45 minutes of aerobic exercise without signs/symptoms of physical distress   Intensity THRR unchanged     Progression   Progression Continue to progress workloads to maintain intensity without signs/symptoms of physical distress.     Resistance Training   Training Prescription Yes   Weight orange bands   Reps 10-12     Interval Training   Interval Training No     NuStep   Level 4   Minutes 17   METs 2.7     Arm Ergometer   Level 3   Minutes 17     Track   Laps 13   Minutes 17      Functional Capacity:     Destiny Rodriguez Name 08/05/15 1607 11/20/15 1659       6 Minute Walk   Phase Initial Discharge    Distance 1011 feet 1480 feet    Walk Time 6 minutes 6 minutes    # of Rest Breaks 0 0    MPH 1.91 2.8    METS 2.47 3.14    RPE 12 11    Perceived Dyspnea  1 1    VO2 Peak 7.03  -  Symptoms No No    Resting HR 99 bpm 101 bpm    Resting BP 112/70 106/60    Max Ex. HR 112 bpm 115 bpm    Max Ex. BP 120/72 124/70    2 Minute Post BP 112/60 112/62      Interval HR   Baseline HR 99 101    1 Minute HR 112 102    2 Minute HR 108 103    3 Minute HR 107 108    4 Minute HR 106 113    5 Minute HR 108 115    6 Minute HR 110 114    2 Minute Post HR 103 95    Interval Heart Rate? Yes Yes      Interval Oxygen   Interval Oxygen? -  room air  -      Pre/Post BP   Baseline BP 112/70  -    6 Minute BP 120/72  -    Pre/Post BP? Yes  -       Psychological, QOL, Others - Outcomes: PHQ 2/9: Depression screen [facility] 2/9 12/22/2015 11/20/2015 09/18/2015 08/04/2015 03/06/2015  Decreased Interest 0 1 0 1 0  Down, Depressed, Hopeless 0 0 0 0 0  PHQ - 2 Score 0 1 0 1 0    Quality of Life:     Quality of Life - 11/18/15 1427      Quality of Life Scores   Health/Function Post 24.57 %   Socioeconomic Post 24.06 %   Psych/Spiritual Post 25.71 %   Family Post 26 %   GLOBAL Post 24.82 %      Personal Goals: Goals established at orientation with interventions provided to work toward goal.     Personal Goals and Risk Factors at Admission - 08/21/15 1623      Core  Components/Risk Factors/Patient Goals on Admission   Personal Goal Other Yes       Personal Goals Discharge:     Goals and Risk Factor Review    Row Name 08/21/15 1625 09/16/15 1514 09/16/15 1516 10/07/15 1626 10/28/15 1132     Core Components/Risk Factors/Patient Goals Review   Personal Goals Review Increase Strength and Stamina;Develop more efficient breathing techniques such as purse lipped breathing and diaphragmatic breathing and practicing self-pacing with activity.;Improve shortness of breath with ADL's;Weight Management/Obesity Weight Management/Obesity Weight Management/Obesity  Pt is underweight.  - Increase Strength and Stamina;Improve shortness of breath with ADL's   Review Destiny Rodriguez has only exercised one session in pulmonary rehab, we will assist her in gaining weight by giving written and verbal information in ways to do so.  We hope to increase her strength and stamina, improve her shortness of breath and develope more efficient breathing techniques with exercise in pulmonary rehab.   In approx 2-3 weeks we will address her home exercise instructions Pt has maintained her wt at this time. Pt educated re: High Calorie, High Protein diet. Suggestions for appetite stimulants given to pt for her to discuss with Dr. Graylon Rodriguez this week. Pt has maintained her wt at this time. Pt educated re: High Calorie, High Protein diet. Suggestions for appetite stimulants given to pt for her to discuss with Dr. Graylon Rodriguez this week. -  She has gained some strength and stamina and has noticed having more energy doing ADL's at home.  No weight gain at this time, maintained her weight Her strength and stamina are improving and she is attending the exercise classes regularly, she is still experiencing shortness of  breath with activities, but is able to demonstrate purse lip breathing correctly.She has only gained .5kg despite nutrition consult.   Expected Outcomes Slow, gradual weight gain along with increased  strength and stamina.  Hopefully her SOB will decrease and her ADL's will become easier. Wt gain to a goal wt of 100-105 lb at graduation from Pulmonary Rehab Wt gain to a goal wt of 100-105 lb at graduation from Pulmonary Rehab -  Increased workloads will improve her strength and stamina and decrease her SOB That she continues to become stronger with an increase in workloads.      Nutrition & Weight - Outcomes:     Pre Biometrics - 08/04/15 1414      Pre Biometrics   Grip Strength 23 kg       Nutrition:     Nutrition Therapy & Goals - 09/16/15 1512      Nutrition Therapy   Diet High Calorie, High Protein     Personal Nutrition Goals   Personal Goal #1 0.5-2 lb wt gain to a goal wt of 100-105 lb at graduation from [place], educate and counsel regarding individualized specific dietary modifications aiming towards targeted core components such as weight, hypertension, lipid management, diabetes, heart failure and other comorbidities.   Expected Outcomes Short Term Goal: Understand basic principles of dietary content, such as calories, fat, sodium, cholesterol and nutrients.;Long Term Goal: Adherence to prescribed nutrition plan.      Nutrition Discharge:     Nutrition Assessments - 12/26/15 1027      Rate Your Plate Scores   Pre Score 63   Post Score 57      Education Questionnaire Score:     Knowledge Questionnaire Score - 11/18/15 1426      Knowledge Questionnaire Score   Pre Score 10/13   Post Score 11/13      Goals reviewed with patient; copy given to patient.

## 2016-01-13 ENCOUNTER — Encounter (HOSPITAL_COMMUNITY)
Admission: RE | Admit: 2016-01-13 | Discharge: 2016-01-13 | Disposition: A | Discharge status: Home / Self Care | Payer: Self-pay | Source: Ambulatory Visit | Attending: Pulmonary Disease | Admitting: Pulmonary Disease

## 2016-01-15 ENCOUNTER — Encounter (HOSPITAL_COMMUNITY)
Admission: RE | Admit: 2016-01-15 | Discharge: 2016-01-15 | Disposition: A | Discharge status: Home / Self Care | Payer: Self-pay | Source: Ambulatory Visit | Attending: Pulmonary Disease | Admitting: Pulmonary Disease

## 2016-01-20 ENCOUNTER — Encounter (HOSPITAL_COMMUNITY)
Admission: RE | Admit: 2016-01-20 | Discharge: 2016-01-20 | Disposition: A | Discharge status: Home / Self Care | Payer: Self-pay | Source: Ambulatory Visit | Attending: Pulmonary Disease | Admitting: Pulmonary Disease

## 2016-01-21 DIAGNOSIS — Z1231 Encounter for screening mammogram for malignant neoplasm of breast: Secondary | ICD-10-CM | POA: Diagnosis not present

## 2016-01-22 ENCOUNTER — Encounter (HOSPITAL_COMMUNITY)
Admission: RE | Admit: 2016-01-22 | Discharge: 2016-01-22 | Disposition: A | Discharge status: Home / Self Care | Payer: Self-pay | Source: Ambulatory Visit | Attending: Pulmonary Disease | Admitting: Pulmonary Disease

## 2016-01-27 ENCOUNTER — Encounter (HOSPITAL_COMMUNITY)
Admission: RE | Admit: 2016-01-27 | Discharge: 2016-01-27 | Disposition: A | Discharge status: Home / Self Care | Payer: Self-pay | Source: Ambulatory Visit | Attending: Pulmonary Disease | Admitting: Pulmonary Disease

## 2016-01-27 DIAGNOSIS — J449 Chronic obstructive pulmonary disease, unspecified: Secondary | ICD-10-CM | POA: Insufficient documentation

## 2016-01-29 ENCOUNTER — Encounter (HOSPITAL_COMMUNITY)
Admission: RE | Admit: 2016-01-29 | Discharge: 2016-01-29 | Disposition: A | Discharge status: Home / Self Care | Payer: Self-pay | Source: Ambulatory Visit | Attending: Pulmonary Disease | Admitting: Pulmonary Disease

## 2016-02-03 ENCOUNTER — Encounter (HOSPITAL_COMMUNITY)
Admission: RE | Admit: 2016-02-03 | Discharge: 2016-02-03 | Disposition: A | Discharge status: Home / Self Care | Payer: Self-pay | Source: Ambulatory Visit | Attending: Pulmonary Disease | Admitting: Pulmonary Disease

## 2016-02-05 ENCOUNTER — Encounter (HOSPITAL_COMMUNITY)
Admission: RE | Admit: 2016-02-05 | Discharge: 2016-02-05 | Disposition: A | Discharge status: Home / Self Care | Payer: Self-pay | Source: Ambulatory Visit | Attending: Pulmonary Disease | Admitting: Pulmonary Disease

## 2016-02-10 ENCOUNTER — Encounter (HOSPITAL_COMMUNITY)
Admission: RE | Admit: 2016-02-10 | Discharge: 2016-02-10 | Disposition: A | Discharge status: Home / Self Care | Payer: Self-pay | Source: Ambulatory Visit | Attending: Pulmonary Disease | Admitting: Pulmonary Disease

## 2016-02-12 ENCOUNTER — Encounter (HOSPITAL_COMMUNITY)
Admission: RE | Admit: 2016-02-12 | Discharge: 2016-02-12 | Disposition: A | Discharge status: Home / Self Care | Payer: Self-pay | Source: Ambulatory Visit | Attending: Pulmonary Disease | Admitting: Pulmonary Disease

## 2016-02-16 ENCOUNTER — Encounter: Payer: Self-pay | Admitting: Pulmonary Disease

## 2016-02-16 ENCOUNTER — Ambulatory Visit (INDEPENDENT_AMBULATORY_CARE_PROVIDER_SITE_OTHER): Payer: Medicare Other | Admitting: Pulmonary Disease

## 2016-02-16 ENCOUNTER — Other Ambulatory Visit (INDEPENDENT_AMBULATORY_CARE_PROVIDER_SITE_OTHER): Payer: Medicare Other

## 2016-02-16 DIAGNOSIS — J479 Bronchiectasis, uncomplicated: Secondary | ICD-10-CM

## 2016-02-16 DIAGNOSIS — R5383 Other fatigue: Secondary | ICD-10-CM | POA: Diagnosis not present

## 2016-02-16 DIAGNOSIS — Z23 Encounter for immunization: Secondary | ICD-10-CM | POA: Diagnosis not present

## 2016-02-16 DIAGNOSIS — R0600 Dyspnea, unspecified: Secondary | ICD-10-CM | POA: Insufficient documentation

## 2016-02-16 LAB — CBC
HEMATOCRIT: 45.5 % (ref 36.0–46.0)
HEMOGLOBIN: 14.9 g/dL (ref 12.0–15.0)
MCHC: 32.7 g/dL (ref 30.0–36.0)
MCV: 93.2 fl (ref 78.0–100.0)
PLATELETS: 290 10*3/uL (ref 150.0–400.0)
RBC: 4.89 Mil/uL (ref 3.87–5.11)
RDW: 13.7 % (ref 11.5–15.5)
WBC: 9.2 10*3/uL (ref 4.0–10.5)

## 2016-02-16 NOTE — Patient Instructions (Signed)
Keep using her therapy vest We will call you with the results of the bloodwork I'm going to suggest that you spread out exercise throughout the week rather than focus on to very intense days All see you back in 4-6 months or sooner if needed

## 2016-02-16 NOTE — Assessment & Plan Note (Signed)
Though she had a mild increase in her mucus production in the last few months this has improved spontaneously and overall she has had a stable interval since the last visit.    Her weight has been stable and I don't have suspicion at this time that her MAI is active.  She remains remarkably active considerin her age and medical problems.  Plan: Continue therapy vest Continue high quality diet Continue albuterol prn

## 2016-02-16 NOTE — Progress Notes (Signed)
Subjective:    Patient ID: Destiny Rodriguez, female    DOB: May 30, 1924, 82 y.o.   MRN: [id number]  Synopsis: Former Patient of Dr. Gwenette Greet with COPD and MAC; She does better with longer courses of antibiotics (14 days) when she has flare ups. Rx with clarithro/ethamb 2000-2001 Started clarithro/ethamb/rif 08/2010  >  finished 7mos of treatment ending May 2014.  She experienced severe muscle cramps due to antibiotics.   CT chest 04/2011: large cavitary disease superimposed over bronchiectasis.  Started on MAC meds per ID 05/2011 CT chest 12/20/12 >Bronchiectasis, pulmonary parenchymal cysts,   A collapsed cyst in the left upper lobe with associated presumed pulmonary parenchymal scarring   Uses therapy vest twice a day.  Spirometry 2010: FEV1 1.26 (71%), ratio 65. Good response to LABA/ICS  AUGUST 2016 Sputum> pseudomonas, cipro sensitive August 2016 Sputum > MAC positive x2  Had pneumonia in March 2017. Refer to pulmonary rehabilitation in April 2017.  HPI Chief Complaint  Patient presents with  . Follow-up    c/o slight increase SOB, mostly dry cough, wheezing/chest tightness when she is doing her exercise program.    [place] says that she has been exercising regularly for the last 6 months. She notes that for the last month she has been more fatigued after exercise and is very tired the next day.  This is defintly worse for her.  She has coughed up more mucus without blood, but she says this actually improved spontaneously.  She wonders if this is related to her age.    She says the exercise routine is fairly strenuous.  She describes fairly heavy cardio and weight routines.   Past Medical History:  Diagnosis Date  . Allergic rhinitis   . Bronchiectasis   . Chronic sinusitis   . Hyperlipidemia   . MAC (mycobacterium avium-intracellulare complex)   . Migraine   . Rheumatism       Review of Systems  Constitutional: Negative for chills, fatigue and fever.  HENT:  Negative for postnasal drip, rhinorrhea and sinus pressure.   Respiratory: Negative for cough, shortness of breath and wheezing.   Cardiovascular: Negative for chest pain, palpitations and leg swelling.       Objective:   Physical Exam Vitals:   02/16/16 1058  BP: 128/68  Pulse: 98  SpO2: 92%  Weight: 96 lb 6.4 oz (43.7 kg)  Height: 5\' 4"  (1.626 m)  RA  Gen: well appearing HENT: OP clear, TM's clear, neck supple PULM: Crackles both bases bilaterally CV: RRR, slight systolic murmur RUSB, and LLSB, trace edema GI: BS+, soft, nontender Derm: no cyanosis or rash Psyche: normal mood and affect       Assessment & Plan:  Bronchiectasis without acute exacerbation ([place]) Though she had a mild increase in her mucus production in the last few months this has improved spontaneously and overall she has had a stable interval since the last visit.    Her weight has been stable and I don't have suspicion at this time that her MAI is active.  She remains remarkably active considerin her age and medical problems.  Plan: Continue therapy vest Continue high quality diet Continue albuterol prn  Fatigue She describes fairly significant fatigue after exercise. This is occurring despite relative stability of her chronic lung disease and no other findings on exam or by history of an underlying medical problem.  Certainly we should be worried about things like hypothyroidism and anemia's I'll check her for both of those today.  However,  my concern is that she is experiencing fatigue secondary to fairly intense exercise added Invanz stage with a chronic lung disease. We know that athletes do better when they periodize their work out routines.   So my suggestion is that she continue with the same amount of work per week but spread out her exercise ever more days during the week. I'm going to forward this note to her folks at the pulmonary rehabilitation clinic to see if they can accomplish  this. > 50% of visit spent face to face, 27 minute visit   Current Outpatient Prescriptions:  .  albuterol (PROVENTIL HFA;VENTOLIN HFA) 108 (90 BASE) MCG/ACT inhaler, Inhale 2 puffs into the lungs every 4 (four) hours as needed for wheezing or shortness of breath., Disp: 1 Inhaler, Rfl: 3 .  albuterol (PROVENTIL) (2.5 MG/3ML) 0.083% nebulizer solution, Take 3 mLs (2.5 mg total) by nebulization every 6 (six) hours as needed for wheezing or shortness of breath., Disp: 360 mL, Rfl: 11 .  Biotin 1000 MCG tablet, Take 1,000 mcg by mouth daily., Disp: , Rfl:  .  budesonide-formoterol (SYMBICORT) 80-4.5 MCG/ACT inhaler, Inhale 2 puffs into the lungs 2 (two) times daily., Disp: 1 Inhaler, Rfl: 12 .  calcium carbonate (OS-CAL) 600 MG TABS, Take 600 mg by mouth 2 (two) times daily with a meal. , Disp: , Rfl:  .  chlorpheniramine (CHLOR-TRIMETON) 4 MG tablet, Take 4 mg by mouth at bedtime., Disp: , Rfl:  .  guaiFENesin (MUCINEX) 600 MG 12 hr tablet, Take 2 tablets (1,200 mg total) by mouth 2 (two) times daily. (Patient taking differently: Take 1,200 mg by mouth 2 (two) times daily as needed. ), Disp: , Rfl:  .  HYDROcodone-homatropine (HYCODAN) 5-1.5 MG/5ML syrup, Take 5 mLs by mouth at bedtime as needed for cough., Disp: 180 mL, Rfl: 0 .  Liniments (SALONPAS PAIN RELIEF PATCH EX), Apply topically as needed. Reported on 12/05/2015, Disp: , Rfl:  .  MULTIPLE VITAMIN PO, Take 1 tablet by mouth daily. , Disp: , Rfl:  .  mupirocin cream (BACTROBAN) 2 %, Apply 1 application topically 2 (two) times daily. To affected area as needed, Disp: 15 g, Rfl: 0 .  OXYGEN, Inhale into the lungs at bedtime., Disp: , Rfl:  .  ranitidine (ZANTAC) 150 MG tablet, Take 1 tablet (150 mg total) by mouth daily after supper., Disp: 30 tablet, Rfl: 1

## 2016-02-16 NOTE — Assessment & Plan Note (Signed)
She describes fairly significant fatigue after exercise. This is occurring despite relative stability of her chronic lung disease and no other findings on exam or by history of an underlying medical problem.  Certainly we should be worried about things like hypothyroidism and anemia's I'll check her for both of those today.  However, my concern is that she is experiencing fatigue secondary to fairly intense exercise added Invanz stage with a chronic lung disease. We know that athletes do better when they periodize their work out routines.   So my suggestion is that she continue with the same amount of work per week but spread out her exercise ever more days during the week. I'm going to forward this note to her folks at the pulmonary rehabilitation clinic to see if they can accomplish this.

## 2016-02-17 ENCOUNTER — Encounter (HOSPITAL_COMMUNITY)
Admission: RE | Admit: 2016-02-17 | Discharge: 2016-02-17 | Disposition: A | Discharge status: Home / Self Care | Payer: Self-pay | Source: Ambulatory Visit | Attending: Pulmonary Disease | Admitting: Pulmonary Disease

## 2016-02-17 NOTE — Progress Notes (Signed)
Spoke with pt and notified of results per Dr.McQuaid. Pt verbalized understanding and denied any questions. 

## 2016-02-19 ENCOUNTER — Encounter (HOSPITAL_COMMUNITY)
Admission: RE | Admit: 2016-02-19 | Discharge: 2016-02-19 | Disposition: A | Discharge status: Home / Self Care | Payer: Self-pay | Source: Ambulatory Visit | Attending: Pulmonary Disease | Admitting: Pulmonary Disease

## 2016-02-24 ENCOUNTER — Encounter (HOSPITAL_COMMUNITY)
Admission: RE | Admit: 2016-02-24 | Discharge: 2016-02-24 | Disposition: A | Discharge status: Home / Self Care | Payer: Self-pay | Source: Ambulatory Visit | Attending: Pulmonary Disease | Admitting: Pulmonary Disease

## 2016-02-24 DIAGNOSIS — J449 Chronic obstructive pulmonary disease, unspecified: Secondary | ICD-10-CM | POA: Insufficient documentation

## 2016-02-26 ENCOUNTER — Encounter (HOSPITAL_COMMUNITY)
Admission: RE | Admit: 2016-02-26 | Discharge: 2016-02-26 | Disposition: A | Discharge status: Home / Self Care | Payer: Self-pay | Source: Ambulatory Visit | Attending: Pulmonary Disease | Admitting: Pulmonary Disease

## 2016-03-02 ENCOUNTER — Encounter (HOSPITAL_COMMUNITY)
Admission: RE | Admit: 2016-03-02 | Discharge: 2016-03-02 | Disposition: A | Discharge status: Home / Self Care | Payer: Self-pay | Source: Ambulatory Visit | Attending: Pulmonary Disease | Admitting: Pulmonary Disease

## 2016-03-04 ENCOUNTER — Encounter (HOSPITAL_COMMUNITY)
Admission: RE | Admit: 2016-03-04 | Discharge: 2016-03-04 | Disposition: A | Discharge status: Home / Self Care | Payer: Self-pay | Source: Ambulatory Visit | Attending: Pulmonary Disease | Admitting: Pulmonary Disease

## 2016-03-04 ENCOUNTER — Other Ambulatory Visit: Payer: Self-pay | Admitting: Pulmonary Disease

## 2016-03-09 ENCOUNTER — Encounter (HOSPITAL_COMMUNITY)
Admission: RE | Admit: 2016-03-09 | Discharge: 2016-03-09 | Disposition: A | Discharge status: Home / Self Care | Payer: Self-pay | Source: Ambulatory Visit | Attending: Pulmonary Disease | Admitting: Pulmonary Disease

## 2016-03-11 ENCOUNTER — Encounter (HOSPITAL_COMMUNITY)
Admission: RE | Admit: 2016-03-11 | Discharge: 2016-03-11 | Disposition: A | Discharge status: Home / Self Care | Payer: Self-pay | Source: Ambulatory Visit | Attending: Pulmonary Disease | Admitting: Pulmonary Disease

## 2016-03-15 ENCOUNTER — Ambulatory Visit (INDEPENDENT_AMBULATORY_CARE_PROVIDER_SITE_OTHER): Payer: Medicare Other | Admitting: Pulmonary Disease

## 2016-03-15 DIAGNOSIS — J479 Bronchiectasis, uncomplicated: Secondary | ICD-10-CM | POA: Diagnosis not present

## 2016-03-15 MED ORDER — LEVOFLOXACIN 500 MG PO TABS: 500.0000 mg | ORAL_TABLET | Freq: Two times a day (BID) | ORAL | 0 refills | Status: DC

## 2016-03-15 MED ORDER — PREDNISONE 10 MG PO TABS
ORAL_TABLET | ORAL | 0 refills | Status: DC
Start: 2016-03-15 — End: 2016-03-26

## 2016-03-15 NOTE — Progress Notes (Signed)
Attending:  I have seen and examined the patient with nurse practitioner/resident and agree with the note above.  We formulated the plan together and I elicited the following history.    [place] feels poorly  Achiness, low grade fever for one week Coughing up more mucus  On exam: Few crackles, wheezes bases CV: RRR, no mgr   Bronchiectasis flare up: second case this year which bothers me considering her MAI growth last year.  Plan Levaquin once daily for 14 days, short prednisone taper. Will need to consider treating MAI this year, but we will treat this acute flare up right now. Check flu test now.  Continue flutter, chest vest.   > 50% of today's 26 minute visit spent face to face  Roselie Awkward, MD [facility] Pager: [phone] Cell: [phone] After 3pm or if no response, call [phone]

## 2016-03-15 NOTE — Assessment & Plan Note (Addendum)
Mild Bronchiectasis Flare Plan: Flu test today Levaquin 500 mg daily x 2 weeks Prednisone taper; 10 mg tablets: 4 tabs x 2 days, 3 tabs x 2 days, 2 tabs x 2 days 1 tab x 2 days then stop Continue Symbicort 2 puffs twice daily Mucinex 600 mg twice daily for chest congestion Flutter Valve/ Chest  Vest  for chest congestion Follow up in 2 weeks with NP. Please contact office for sooner follow up if symptoms do not improve or worsen or seek emergency care

## 2016-03-15 NOTE — Progress Notes (Signed)
History of Present Illness Destiny Rodriguez is a 82 y.o. female with COPD and MAC infection with colonization, finished treatment in April 2014.She is followed by Dr. Lake Bells  Synopsis: Former Patient of Dr. Gwenette Greet with COPD and MAC; She does better with longer courses of antibiotics (14 days) when she has flare ups. Rx with clarithro/ethamb 2000-2001 Started clarithro/ethamb/rif 08/2010  >  finished 54mos of treatment ending May 2014.  She experienced severe muscle cramps due to antibiotics.   CT chest 04/2011: large cavitary disease superimposed over bronchiectasis.  Started on MAC meds per ID 05/2011 CT chest 12/20/12 >Bronchiectasis, pulmonary parenchymal cysts,   A collapsed cyst in the left upper lobe with associated presumed pulmonary parenchymal scarring   Uses therapy vest twice a day.  Spirometry 2010: FEV1 1.26 (71%), ratio 65. Good response to LABA/ICS  AUGUST 2016 Sputum> pseudomonas, cipro sensitive August 2016 Sputum > MAC positive x2  Had pneumonia in March 2017. Refer to pulmonary rehabilitation in April 2017.  03/15/2016 Acute OV: Pt. Presents to the office today for acute visit. She is short of breath with body aches for the last week..She states her chest is tight.She has been wheezing.She states she has had a low grade fever with some chills. She states her cough is worse, and more productive. It has a greenish color which is not her baseline. She has had a sore throat.She has no appetite at present. She is compliant with her medications and her flutter valve/ chest vest. She exercises regularly at [facility] rehab. She denies chest pain, orthopnea or hemoptysis.She has had her high dose flu vaccine this season.  Tests: 03/15/2016: Influenza A&B: Negative  Past medical hx Past Medical History:  Diagnosis Date  . Allergic rhinitis   . Bronchiectasis   . Chronic sinusitis   . Hyperlipidemia   . MAC (mycobacterium avium-intracellulare complex)   .  Migraine   . Rheumatism      Past surgical hx, Family hx, Social hx all reviewed.  Current Outpatient Prescriptions on File Prior to Visit  Medication Sig  . albuterol (PROVENTIL HFA;VENTOLIN HFA) 108 (90 BASE) MCG/ACT inhaler Inhale 2 puffs into the lungs every 4 (four) hours as needed for wheezing or shortness of breath.  Marland Kitchen albuterol (PROVENTIL) (2.5 MG/3ML) 0.083% nebulizer solution Take 3 mLs (2.5 mg total) by nebulization every 6 (six) hours as needed for wheezing or shortness of breath.  . Biotin 1000 MCG tablet Take 1,000 mcg by mouth daily.  . calcium carbonate (OS-CAL) 600 MG TABS Take 600 mg by mouth 2 (two) times daily with a meal.   . chlorpheniramine (CHLOR-TRIMETON) 4 MG tablet Take 4 mg by mouth at bedtime.  Marland Kitchen guaiFENesin (MUCINEX) 600 MG 12 hr tablet Take 2 tablets (1,200 mg total) by mouth 2 (two) times daily. (Patient taking differently: Take 1,200 mg by mouth 2 (two) times daily as needed. )  . MULTIPLE VITAMIN PO Take 1 tablet by mouth daily.   . mupirocin cream (BACTROBAN) 2 % Apply 1 application topically 2 (two) times daily. To affected area as needed  . OXYGEN Inhale into the lungs at bedtime.  . ranitidine (ZANTAC) 150 MG tablet Take 1 tablet (150 mg total) by mouth daily after supper.  . SYMBICORT 80-4.5 MCG/ACT inhaler USE 2 PUFFS TWICE DAILY.  Marland Kitchen HYDROcodone-homatropine (HYCODAN) 5-1.5 MG/5ML syrup Take 5 mLs by mouth at bedtime as needed for cough. (Patient not taking: Reported on 03/15/2016)  . Liniments (SALONPAS PAIN RELIEF PATCH EX) Apply topically as  needed. Reported on 12/05/2015   No current facility-administered medications on file prior to visit.      Allergies  Allergen Reactions  . Tequin Rash    Review Of Systems:  Constitutional:   No  weight loss, night sweats,  Fevers, chills, fatigue, or  lassitude.  HEENT:   + headaches,  Difficulty swallowing,  Tooth/dental problems, or  Sore throat,                No sneezing, itching, ear ache, nasal  congestion, post nasal drip,   CV:  No chest pain,  Orthopnea, PND, swelling in lower extremities, anasarca, dizziness, palpitations, syncope.   GI  No heartburn, indigestion, abdominal pain, nausea, vomiting, diarrhea, change in bowel habits, +loss of appetite, bloody stools.   Resp: + shortness of breath with exertion or at rest.  + excess mucus, + productive cough,  No non-productive cough,  No coughing up of blood.  + change in color of mucus.  + wheezing.  No chest wall deformity  Skin: no rash or lesions.  GU: no dysuria, change in color of urine, no urgency or frequency.  No flank pain, no hematuria   MS:  No joint pain or swelling.  No decreased range of motion.  No back pain.  Psych:  No change in mood or affect. No depression or anxiety.  No memory loss.   Vital Signs BP 138/60 (BP Location: Right Arm, Cuff Size: Normal)   Pulse 84   Temp 98.2 F (36.8 C)   Ht 5' 4.5" (1.638 m)   Wt 96 lb 8 oz (43.8 kg)   SpO2 93%   BMI 16.31 kg/m    Physical Exam:  General- No distress,  A&Ox3, pleasant, thin ENT: No sinus tenderness, TM clear, pale nasal mucosa, no oral exudate,no post nasal drip, no LAN Cardiac: S1, S2, regular rate and rhythm, no murmur Chest: + wheeze/ rales/ diminished per bases; no accessory muscle use, no nasal flaring, no sternal retractions Abd.: Soft Non-tender Ext: No clubbing cyanosis, edema Neuro:  normal strength Skin: No rashes, warm and dry Psych: normal mood and behavior   Assessment/Plan  Bronchiectasis without acute exacerbation (HCC) Mild Bronchiectasis Flare Plan: Flu test today Levaquin 500 mg daily x 2 weeks Prednisone taper; 10 mg tablets: 4 tabs x 2 days, 3 tabs x 2 days, 2 tabs x 2 days 1 tab x 2 days then stop Continue Symbicort 2 puffs twice daily Mucinex 600 mg twice daily for chest congestion Flutter Valve/ Chest  Vest  for chest congestion Follow up in 2 weeks with NP. Please contact office for sooner follow up if  symptoms do not improve or worsen or seek emergency care       Magdalen Spatz, NP 03/15/2016  5:04 PM

## 2016-03-15 NOTE — Patient Instructions (Addendum)
It is good to see you today. Flu test today Levaquin 500 mg daily x 2 weeks Prednisone taper; 10 mg tablets: 4 tabs x 2 days, 3 tabs x 2 days, 2 tabs x 2 days 1 tab x 2 days then stop Continue Symbicort 2 puffs twice daily Mucinex 600 mg twice daily for chest congestion Flutter Valve/ Chest  Vest  for chest congestion Follow up in 2 weeks with NP. Please contact office for sooner follow up if symptoms do not improve or worsen or seek emergency care   .

## 2016-03-16 ENCOUNTER — Encounter (HOSPITAL_COMMUNITY): Payer: Medicare Other

## 2016-03-17 NOTE — Progress Notes (Signed)
Destiny Rodriguez has decided to end her enrollment in [employer] Pulmonary Maintenance program. Her last day will be 03/23/16. She plans to exercise at the gym at her retirement community.

## 2016-03-18 ENCOUNTER — Encounter (HOSPITAL_COMMUNITY)
Admission: RE | Admit: 2016-03-18 | Discharge: 2016-03-18 | Disposition: A | Discharge status: Home / Self Care | Payer: Medicare Other | Source: Ambulatory Visit | Attending: Pulmonary Disease | Admitting: Pulmonary Disease

## 2016-03-19 ENCOUNTER — Encounter: Payer: Self-pay | Admitting: Pulmonary Disease

## 2016-03-19 LAB — POCT INFLUENZA A/B
INFLUENZA A, POC: NEGATIVE
Influenza B, POC: NEGATIVE

## 2016-03-22 ENCOUNTER — Other Ambulatory Visit: Payer: Self-pay | Admitting: Pulmonary Disease

## 2016-03-23 ENCOUNTER — Telehealth: Payer: Self-pay | Admitting: Pulmonary Disease

## 2016-03-23 ENCOUNTER — Encounter (HOSPITAL_COMMUNITY)
Admission: RE | Admit: 2016-03-23 | Discharge: 2016-03-23 | Disposition: A | Discharge status: Home / Self Care | Payer: Self-pay | Source: Ambulatory Visit | Attending: Pulmonary Disease | Admitting: Pulmonary Disease

## 2016-03-23 NOTE — Telephone Encounter (Signed)
Pt seen in office on 03-15-16. Pt started on Levaquin 500 mg daily x 2 weeks and prednisone taper. Pt completed prednisone taper today and one week left on Levaquin. Pt states her infection has improved but she is afraid that the medcation is catching up with her Pt c/o increased weakness & fatigued. Pt would like to know if she should stop this abx. Pt states she has her usual cough but nothing unusual and usual sob with exertion.  BQ please advise. Thanks

## 2016-03-23 NOTE — Telephone Encounter (Signed)
Called and spoke with pt and she is aware of BQ recs. Nothing further is needed.  

## 2016-03-23 NOTE — Telephone Encounter (Signed)
OK to hold levaquin

## 2016-03-25 ENCOUNTER — Encounter (HOSPITAL_COMMUNITY): Payer: Medicare Other

## 2016-03-25 ENCOUNTER — Telehealth: Payer: Self-pay | Admitting: Pulmonary Disease

## 2016-03-25 DIAGNOSIS — R05 Cough: Secondary | ICD-10-CM

## 2016-03-25 DIAGNOSIS — R5383 Other fatigue: Secondary | ICD-10-CM

## 2016-03-25 DIAGNOSIS — R059 Cough, unspecified: Secondary | ICD-10-CM

## 2016-03-25 MED ORDER — AMOXICILLIN-POT CLAVULANATE 875-125 MG PO TABS: 1.0000 | ORAL_TABLET | Freq: Two times a day (BID) | ORAL | 0 refills | Status: DC

## 2016-03-25 NOTE — Telephone Encounter (Signed)
Called spoke with patient who verified that she did stop the Levaquin as recommended by BQ on 10.31.17 but she is still "feeling bad" : She slept all day yesterday, thought she was feeling better today so she went to a class, but now she feels "terrible".  She does note dyspnea, but nothing acute.  Feels "very very sleepy", is not nauseated or vomiting, she just "feels bad".  She has a moderate, cough, wheezing and congestion and stated that she soes feel like she has a weight on her chest.  The office only has 1 opening tomorrow > 4:15 w/ RA  Dr Lake Bells please advise, thank you.

## 2016-03-25 NOTE — Telephone Encounter (Signed)
Called spoke with patient and discussed BQ recs as stated below Ov scheduled with RA for tomorrow 11.3.17 @ 1615, pt to arrive at [phone] for cxr and labs - orders placed for STAT Augmentin sent to verified pharmacy Pt is aware to seen emergency attention if symptoms worsen prior to ov  Nothing further needed at this time; will sign off.

## 2016-03-25 NOTE — Telephone Encounter (Signed)
She needs to be seen tomorrow with a CXR and labs before, no questions asked.  CXR for cough, cbc, bmet for fatigue In the meantime call in augmentin 875 bid x 5 days If worse, go to ER

## 2016-03-26 ENCOUNTER — Ambulatory Visit (INDEPENDENT_AMBULATORY_CARE_PROVIDER_SITE_OTHER): Payer: Medicare Other | Admitting: Pulmonary Disease

## 2016-03-26 ENCOUNTER — Encounter: Payer: Self-pay | Admitting: Pulmonary Disease

## 2016-03-26 ENCOUNTER — Ambulatory Visit (INDEPENDENT_AMBULATORY_CARE_PROVIDER_SITE_OTHER)
Admission: RE | Admit: 2016-03-26 | Discharge: 2016-03-26 | Disposition: A | Discharge status: Home / Self Care | Payer: Medicare Other | Source: Ambulatory Visit | Attending: Pulmonary Disease | Admitting: Pulmonary Disease

## 2016-03-26 ENCOUNTER — Other Ambulatory Visit (INDEPENDENT_AMBULATORY_CARE_PROVIDER_SITE_OTHER): Payer: Medicare Other

## 2016-03-26 VITALS — BP 100/60 | HR 94 | Ht 64.5 in | Wt 97.2 lb

## 2016-03-26 DIAGNOSIS — R5383 Other fatigue: Secondary | ICD-10-CM

## 2016-03-26 DIAGNOSIS — R059 Cough, unspecified: Secondary | ICD-10-CM

## 2016-03-26 DIAGNOSIS — R05 Cough: Secondary | ICD-10-CM | POA: Diagnosis not present

## 2016-03-26 DIAGNOSIS — E871 Hypo-osmolality and hyponatremia: Secondary | ICD-10-CM | POA: Diagnosis not present

## 2016-03-26 DIAGNOSIS — R0602 Shortness of breath: Secondary | ICD-10-CM | POA: Diagnosis not present

## 2016-03-26 LAB — CBC WITH DIFFERENTIAL/PLATELET
BASOS ABS: 0 10*3/uL (ref 0.0–0.1)
Basophils Relative: 0.3 % (ref 0.0–3.0)
EOS ABS: 0.1 10*3/uL (ref 0.0–0.7)
Eosinophils Relative: 0.5 % (ref 0.0–5.0)
HEMATOCRIT: 42.6 % (ref 36.0–46.0)
Hemoglobin: 14.1 g/dL (ref 12.0–15.0)
LYMPHS PCT: 9.2 % — AB (ref 12.0–46.0)
Lymphs Abs: 1.4 10*3/uL (ref 0.7–4.0)
MCHC: 33.2 g/dL (ref 30.0–36.0)
MCV: 92.9 fl (ref 78.0–100.0)
MONOS PCT: 13.2 % — AB (ref 3.0–12.0)
Monocytes Absolute: 2 10*3/uL — ABNORMAL HIGH (ref 0.1–1.0)
NEUTROS PCT: 76.8 % (ref 43.0–77.0)
Neutro Abs: 11.4 10*3/uL — ABNORMAL HIGH (ref 1.4–7.7)
Platelets: 197 10*3/uL (ref 150.0–400.0)
RBC: 4.59 Mil/uL (ref 3.87–5.11)
RDW: 14.2 % (ref 11.5–15.5)
WBC: 14.8 10*3/uL — ABNORMAL HIGH (ref 4.0–10.5)

## 2016-03-26 LAB — BASIC METABOLIC PANEL
BUN: 13 mg/dL (ref 6–23)
CHLORIDE: 97 meq/L (ref 96–112)
CO2: 30 meq/L (ref 19–32)
CREATININE: 0.63 mg/dL (ref 0.40–1.20)
Calcium: 8.9 mg/dL (ref 8.4–10.5)
GFR: 94.44 mL/min (ref >60.00)
Glucose, Bld: 132 mg/dL — ABNORMAL HIGH (ref 70–99)
POTASSIUM: 4.3 meq/L (ref 3.5–5.1)
Sodium: 132 mEq/L — ABNORMAL LOW (ref 135–145)

## 2016-03-26 NOTE — Progress Notes (Signed)
   Subjective:    Patient ID: Destiny Rodriguez, female    DOB: 1925-03-07, 82 y.o.   MRN: [id number]  HPI  82 y.o. female with COPD and MAC infection vs colonization, finished treatment in April 2014.She is followed by Dr. Lake Bells  Rx with clarithro/ethamb 2000-2001 Started clarithro/ethamb/rif 08/2010 >finished 60mos of treatment ending May 2014. She experienced severe muscle cramps due to antibiotics.   03/26/2016  Chief Complaint  Patient presents with  . Acute Visit    Pt. of [facility],Pt. has stated her cough has been worse,Pt. c/o some wheezing,fatigue,some sob    Pt seen in office on 03-15-16. Pt started on Levaquin 500 mg daily x 2 weeks and prednisone taper. Pt completed prednisone taper  and one week left on Levaquin. Pt states her infection has improved but she is afraid that the medcation is catching up with her Pt c/o increased weakness & fatigue >> was asked to stop Levaquin and started on Augmentin  Blood pressure was noted to be low during pulmonary rehabilitation, 100/60 today.  Labs reviewed-slight elevated WBC, mildly decreased sodium 132 from 135  Chest x-ray from 08/2015 reviewed  Significant tests/ events  CT chest 04/2011: large cavitary disease superimposed over bronchiectasis.  Started on MAC meds per ID 05/2011 CT chest 12/20/12 >Bronchiectasis, pulmonary parenchymal cysts, A collapsed cyst in the left upper lobe with associated presumed pulmonary parenchymal scarring   Spirometry 2010: FEV1 1.26 (71%), ratio 65.  Review of Systems Patient denies significant dyspnea,cough, hemoptysis,  chest pain, palpitations, pedal edema, orthopnea, paroxysmal nocturnal dyspnea, lightheadedness, nausea, vomiting, abdominal or  leg pains      Objective:   Physical Exam  Gen. Pleasant, thin, frail, in no distress ENT - no lesions, no post nasal drip Neck: No JVD, no thyromegaly, no carotid bruits Lungs: no use of accessory muscles, no dullness to percussion,  clear without rales or rhonchi  Cardiovascular: Rhythm regular, heart sounds  normal, no murmurs or gallops, no peripheral edema Musculoskeletal: No deformities, no cyanosis or clubbing        Assessment & Plan:

## 2016-03-26 NOTE — Assessment & Plan Note (Signed)
Chest x-ray today  Your lab work shows elevated WBC count-which may be related to infection or steroids   Okay to stop antibiotic otherwise

## 2016-03-26 NOTE — Patient Instructions (Signed)
Chest x-ray today  Your lab work shows elevated WBC count-which may be related to infection or steroids   - Low sodium level - eat salt crackers daily  recheck blood work in one week  If feeling worse, keep appointment on Monday  Okay to stop antibiotic otherwise

## 2016-03-26 NOTE — Assessment & Plan Note (Signed)
Her fatigue could be related to mild hypotension and hyponatremia-or simply medication effect, or result of recent bronchitis  - Low sodium level - eat salt crackers daily  recheck blood work in one week  If feeling worse, keep appointment on Monday

## 2016-03-29 ENCOUNTER — Encounter: Payer: Self-pay | Admitting: Acute Care

## 2016-03-29 ENCOUNTER — Ambulatory Visit (INDEPENDENT_AMBULATORY_CARE_PROVIDER_SITE_OTHER): Payer: Medicare Other | Admitting: Acute Care

## 2016-03-29 ENCOUNTER — Ambulatory Visit
Admission: RE | Admit: 2016-03-29 | Discharge: 2016-03-29 | Disposition: A | Discharge status: Home / Self Care | Payer: Medicare Other | Source: Ambulatory Visit | Attending: Pulmonary Disease | Admitting: Pulmonary Disease

## 2016-03-29 ENCOUNTER — Ambulatory Visit: Payer: BLUE CROSS/BLUE SHIELD | Admitting: Acute Care

## 2016-03-29 DIAGNOSIS — R05 Cough: Secondary | ICD-10-CM

## 2016-03-29 DIAGNOSIS — J471 Bronchiectasis with (acute) exacerbation: Secondary | ICD-10-CM | POA: Diagnosis not present

## 2016-03-29 DIAGNOSIS — R059 Cough, unspecified: Secondary | ICD-10-CM

## 2016-03-29 MED ORDER — AMOXICILLIN-POT CLAVULANATE 875-125 MG PO TABS: 1.0000 | ORAL_TABLET | Freq: Two times a day (BID) | ORAL | 0 refills | Status: DC

## 2016-03-29 NOTE — Patient Instructions (Addendum)
It is nice to see you again. Continue the Augmentin twice daily x 5 days as prescribed. Drink plenty of fluids and rest. Eat saltines with salt. Delsym for cough during the day every 12 hours. Use your Hydromet 5 cc's as needed for cough at night. Continue Mucinex and Chest vest as you have been doing. Do not drive if sleepy. Follow up in  1 weeks with NP, with BMET and CBC prior. Probiotics with Augmentin ( Align or Culturette)  Please contact office for sooner follow up if symptoms do not improve or worsen or seek emergency care

## 2016-03-29 NOTE — Assessment & Plan Note (Addendum)
Slow to resolve Bronchiectasis exacerbation Flu vaccine up to date. Plan: Continue the Augmentin twice daily x 5 days as prescribed. Drink plenty of fluids and rest. Eat saltines with salt. Delsym for cough during the day every 12 hours. Use your Hydromet 5 cc's as needed for cough at night. Do not drive if sleepy. Continue Mucinex and Chest vest as you have been doing. Continue Symbicort daily as you have been doing. Follow up in  1 weeks with NP, with BMET and CBC prior. Probiotics with Augmentin ( Align or Culturette)  Please contact office for sooner follow up if symptoms do not improve or worsen or seek emergency care

## 2016-03-29 NOTE — Progress Notes (Signed)
History of Present Illness Destiny Rodriguez is a 82 y.o. female with bronchiectasis, COPD, and MAC infection vs colonization. She finished treatment in April 2014. She is followed by Dr. Lake Bells.  Rx with clarithro/ethamb 2000-2001 Started clarithro/ethamb/rif 08/2010 >finished 9mos of treatment ending May 2014. She experienced severe muscle cramps due to antibiotics.    03/29/2016 Follow Up  Destiny Rodriguez presents for follow up today for worsening cough, since last seen 11/3 2017. She has had several recent OV for this slow to resolve exacerbation of her bronchiectasis. History is as below:  03/15/16 Levaquin x 2 weeks and pred taper...she only took x 1 week.( OV : [facility]) 03/25/16 Augmentin 875 x 5 days ( took x 2 days) Telephone order 03/26/16 Ranelle Oyster and he stated to stop Augmentin as  CXR was clear, labs indicted leukocytosis and hyponatremia, but recent prednisone. 03/27/16 Felt better 03/28/16 Awoken from sleep coughing, so she resumed the Augmentin She has noted some low blood pressures within the last week, first noted 10/31.( BP today 108/62) She is taking Mucinex and using her chest vest twice daily She has not been taking cough medicine on a regular basis through this recent exacerbation, and has not been sleeping. She denies fever , chest pain Secretions white to tan  with tinges of pink.   I discussed with [name] that she usually requires longer treatment with antibiotics, so I have requested she complete  A full 5 day course of Augmentin and return in 1 week for follow up,to evaluate if longer treatment period is needed.She did state that she has has some pink tinged secretions She is compliant with her Symbicort and Mucinex daily.She denies fever, chest pain, orthopnea or any frank hemoptysis.  Tests CXR 03/26/2016 IMPRESSION: COPD with severe chronic changes, stable.  No active disease.  Uses therapy vest twice a day.  Spirometry 2010: FEV1 1.26 (71%), ratio  65. Good response to LABA/ICS  AUGUST 2016 Sputum> pseudomonas, cipro sensitive August 2016 Sputum > MAC positive x2   Past medical hx Past Medical History:  Diagnosis Date  . Allergic rhinitis   . Bronchiectasis   . Chronic sinusitis   . Hyperlipidemia   . MAC (mycobacterium avium-intracellulare complex)   . Migraine   . Rheumatism      Past surgical hx, Family hx, Social hx all reviewed.  Current Outpatient Prescriptions on File Prior to Visit  Medication Sig  . albuterol (PROVENTIL) (2.5 MG/3ML) 0.083% nebulizer solution Take 3 mLs (2.5 mg total) by nebulization every 6 (six) hours as needed for wheezing or shortness of breath.  . Biotin 1000 MCG tablet Take 1,000 mcg by mouth daily.  . calcium carbonate (OS-CAL) 600 MG TABS Take 600 mg by mouth 2 (two) times daily with a meal.   . chlorpheniramine (CHLOR-TRIMETON) 4 MG tablet Take 4 mg by mouth at bedtime.  Marland Kitchen guaiFENesin (MUCINEX) 600 MG 12 hr tablet Take 2 tablets (1,200 mg total) by mouth 2 (two) times daily. (Patient taking differently: Take 1,200 mg by mouth 2 (two) times daily as needed. )  . HYDROcodone-homatropine (HYCODAN) 5-1.5 MG/5ML syrup Take 5 mLs by mouth at bedtime as needed for cough.  . Liniments (SALONPAS PAIN RELIEF PATCH EX) Apply topically as needed. Reported on 12/05/2015  . MULTIPLE VITAMIN PO Take 1 tablet by mouth daily.   . mupirocin cream (BACTROBAN) 2 % Apply 1 application topically 2 (two) times daily. To affected area as needed  . OXYGEN Inhale into the lungs at  bedtime.  Marland Kitchen PROAIR HFA 108 (90 Base) MCG/ACT inhaler USE 2 PUFFS EVERY 4 HOURS AS NEEDED FOR WHEEZE OR SHORTNESS OF BREATHOR BAD COUGH.  . ranitidine (ZANTAC) 150 MG tablet Take 1 tablet (150 mg total) by mouth daily after supper.  . SYMBICORT 80-4.5 MCG/ACT inhaler USE 2 PUFFS TWICE DAILY.   No current facility-administered medications on file prior to visit.      Allergies  Allergen Reactions  . Tequin Rash    Review Of  Systems:  Constitutional:   No  weight loss, night sweats,  Fevers, chills, fatigue, or  lassitude.  HEENT:   No headaches,  Difficulty swallowing,  Tooth/dental problems, or  Sore throat,                No sneezing, itching, ear ache, nasal congestion, post nasal drip,   CV:  No chest pain,  Orthopnea, PND, swelling in lower extremities, anasarca, dizziness, palpitations, syncope.   GI  No heartburn, indigestion, abdominal pain, nausea, vomiting, diarrhea, change in bowel habits, loss of appetite, bloody stools.   Resp: No shortness of breath with exertion or at rest.  No excess mucus, no productive cough,  No non-productive cough,  No coughing up of blood.  No change in color of mucus.  No wheezing.  No chest wall deformity  Skin: no rash or lesions.  GU: no dysuria, change in color of urine, no urgency or frequency.  No flank pain, no hematuria   MS:  No joint pain or swelling.  No decreased range of motion.  No back pain.  Psych:  No change in mood or affect. No depression or anxiety.  No memory loss.   Vital Signs BP 108/62 (BP Location: Left Arm, Cuff Size: Normal)   Pulse 96   Ht 5\' 4"  (1.626 m)   Wt 98 lb (44.5 kg)   SpO2 90%   BMI 16.82 kg/m    Physical Exam:  General- No distress,  A&Ox3 ENT: No sinus tenderness, TM clear, pale nasal mucosa, no oral exudate,no post nasal drip, no LAN Cardiac: S1, S2, regular rate and rhythm, no murmur Chest: No wheeze/ rales/ dullness; no accessory muscle use, no nasal flaring, no sternal retractions Abd.: Soft Non-tender Ext: No clubbing cyanosis, edema Neuro:  normal strength Skin: No rashes, warm and dry Psych: normal mood and behavior   Assessment/Plan  Bronchiectasis without acute exacerbation (HCC) Slow to resolve Bronchiectasis exacerbation Flu vaccine up to date. Plan: Continue the Augmentin twice daily x 5 days as prescribed. Drink plenty of fluids and rest. Eat saltines with salt. Delsym for cough during the  day every 12 hours. Use your Hydromet 5 cc's as needed for cough at night. Do not drive if sleepy. Continue Mucinex and Chest vest as you have been doing. Continue Symbicort daily as you have been doing. Follow up in  1 weeks with NP, with BMET and CBC prior. Probiotics with Augmentin ( Align or Culturette)  Please contact office for sooner follow up if symptoms do not improve or worsen or seek emergency care       Magdalen Spatz, NP 03/29/2016  1:59 PM

## 2016-03-30 ENCOUNTER — Encounter (HOSPITAL_COMMUNITY): Payer: Medicare Other

## 2016-03-30 DIAGNOSIS — E7800 Pure hypercholesterolemia, unspecified: Secondary | ICD-10-CM | POA: Diagnosis not present

## 2016-03-30 DIAGNOSIS — Z0000 Encounter for general adult medical examination without abnormal findings: Secondary | ICD-10-CM | POA: Diagnosis not present

## 2016-03-30 DIAGNOSIS — K5901 Slow transit constipation: Secondary | ICD-10-CM | POA: Diagnosis not present

## 2016-03-30 DIAGNOSIS — Z79899 Other long term (current) drug therapy: Secondary | ICD-10-CM | POA: Diagnosis not present

## 2016-03-30 DIAGNOSIS — A310 Pulmonary mycobacterial infection: Secondary | ICD-10-CM | POA: Diagnosis not present

## 2016-03-30 DIAGNOSIS — F3342 Major depressive disorder, recurrent, in full remission: Secondary | ICD-10-CM | POA: Diagnosis not present

## 2016-03-30 DIAGNOSIS — J479 Bronchiectasis, uncomplicated: Secondary | ICD-10-CM | POA: Diagnosis not present

## 2016-03-30 DIAGNOSIS — J9611 Chronic respiratory failure with hypoxia: Secondary | ICD-10-CM | POA: Diagnosis not present

## 2016-04-01 ENCOUNTER — Encounter (HOSPITAL_COMMUNITY): Payer: Medicare Other

## 2016-04-01 ENCOUNTER — Telehealth: Payer: Self-pay | Admitting: Pulmonary Disease

## 2016-04-01 NOTE — Telephone Encounter (Signed)
Notes Recorded by Rigoberto Noel, MD on 03/26/2016 at 5:17 PM EDT No evidence of pneumonia-okay to stop Augmentin Cystic changes as before Would suggest CT chest no contrast   Called and spoke with pt and she is aware of cxr results.  She wanted to know if BQ wants her to have the ct scan.  BQ please advise. thanks

## 2016-04-05 ENCOUNTER — Other Ambulatory Visit (INDEPENDENT_AMBULATORY_CARE_PROVIDER_SITE_OTHER): Payer: Medicare Other

## 2016-04-05 ENCOUNTER — Telehealth: Payer: Self-pay | Admitting: Acute Care

## 2016-04-05 ENCOUNTER — Encounter: Payer: Self-pay | Admitting: Acute Care

## 2016-04-05 ENCOUNTER — Ambulatory Visit (INDEPENDENT_AMBULATORY_CARE_PROVIDER_SITE_OTHER): Payer: Medicare Other | Admitting: Acute Care

## 2016-04-05 VITALS — BP 128/70 | HR 101 | Ht 64.0 in | Wt 95.0 lb

## 2016-04-05 DIAGNOSIS — E871 Hypo-osmolality and hyponatremia: Secondary | ICD-10-CM

## 2016-04-05 DIAGNOSIS — J471 Bronchiectasis with (acute) exacerbation: Secondary | ICD-10-CM

## 2016-04-05 LAB — BASIC METABOLIC PANEL
BUN: 13 mg/dL (ref 6–23)
CALCIUM: 9.5 mg/dL (ref 8.4–10.5)
CO2: 34 mEq/L — ABNORMAL HIGH (ref 19–32)
CREATININE: 0.56 mg/dL (ref 0.40–1.20)
Chloride: 97 mEq/L (ref 96–112)
GFR: 108.19 mL/min (ref >60.00)
Glucose, Bld: 99 mg/dL (ref 70–99)
Potassium: 4.7 mEq/L (ref 3.5–5.1)
SODIUM: 136 meq/L (ref 135–145)

## 2016-04-05 NOTE — Progress Notes (Signed)
History of Present Illness Destiny Rodriguez is a 82 y.o. female with bronchiectasis, COPD, and MAC infection vs colonization. She finished treatment in April 2014. She is followed by Dr. Lake Bells.   Rx with clarithro/ethamb 2000-2001 Started clarithro/ethamb/rif 08/2010 >finished 68mos of treatment ending May 2014. She experienced severe muscle cramps due to antibiotics.   04/05/2016 1 week Follow Up Appointment Pt. Presents today for 1 week follow up of worsening cough. She has had several recent OV for this slow to resolve exacerbation of her bronchiectasis. History of recent treatments below:  03/15/16 Levaquin x 2 weeks and pred taper...she only took x 1 week.( OV : McQuaid) 03/25/16 Augmentin 875 x 5 days ( took x 2 days) Telephone order 03/26/16 Ranelle Oyster and he stated to stop Augmentin as  CXR was clear, labs indicted leukocytosis and hyponatremia, but recent prednisone. 03/27/16 Felt better 03/28/16 Awoken from sleep coughing, so she resumed the Augmentin 03/29/2016: Augmentin 875  x an additional 5 days   She will complete her last Augmentin dosing tomorrow 04/06/16. She is continuing to use her mucinex daily. She is better per her own history. Cough is better. Sputum has returned to her baseline.She is compliant with her Symbicort daily. She denies fever , chest pain, orthopnea or hemoptysis.She did note some pink flecks in her sputum x 2, but they cleared on their own.We have talked about her weight today. She is 95 pounds. She feels she eats a well balanced diet, and does not like the Ensure of Boost products.She did go to a dietician for 8 months, but was never able to reach 100 pounds. She is anxious to start back to her exercise regimen.She plans to continue her Pulmonary rehab exercises at [facility] when she feels stronger.I did discuss the need to increase her caloric intake when she resumed exercise.She is scheduled to return to see Dr. Lake Bells to discuss  the necessity for a  CT scan 11/29. She is going to go for follow up BMET today to ensure her sodium has returned to normal after being low.  Tests:  CXR 03/26/2016 IMPRESSION: COPD with severe chronic changes, stable.  No active disease.  Past medical hx Past Medical History:  Diagnosis Date  . Allergic rhinitis   . Bronchiectasis   . Chronic sinusitis   . Hyperlipidemia   . MAC (mycobacterium avium-intracellulare complex)   . Migraine   . Rheumatism      Past surgical hx, Family hx, Social hx all reviewed.  Current Outpatient Prescriptions on File Prior to Visit  Medication Sig  . albuterol (PROVENTIL) (2.5 MG/3ML) 0.083% nebulizer solution Take 3 mLs (2.5 mg total) by nebulization every 6 (six) hours as needed for wheezing or shortness of breath.  Marland Kitchen amoxicillin-clavulanate (AUGMENTIN) 875-125 MG tablet Take 1 tablet by mouth 2 (two) times daily.  . Biotin 1000 MCG tablet Take 1,000 mcg by mouth daily.  . calcium carbonate (OS-CAL) 600 MG TABS Take 600 mg by mouth 2 (two) times daily with a meal.   . chlorpheniramine (CHLOR-TRIMETON) 4 MG tablet Take 4 mg by mouth at bedtime.  Marland Kitchen guaiFENesin (MUCINEX) 600 MG 12 hr tablet Take 2 tablets (1,200 mg total) by mouth 2 (two) times daily. (Patient taking differently: Take 1,200 mg by mouth 2 (two) times daily as needed. )  . HYDROcodone-homatropine (HYCODAN) 5-1.5 MG/5ML syrup Take 5 mLs by mouth at bedtime as needed for cough.  . Liniments (SALONPAS PAIN RELIEF PATCH EX) Apply topically as needed. Reported on  12/05/2015  . MULTIPLE VITAMIN PO Take 1 tablet by mouth daily.   . mupirocin cream (BACTROBAN) 2 % Apply 1 application topically 2 (two) times daily. To affected area as needed  . OXYGEN Inhale into the lungs at bedtime.  Marland Kitchen PROAIR HFA 108 (90 Base) MCG/ACT inhaler USE 2 PUFFS EVERY 4 HOURS AS NEEDED FOR WHEEZE OR SHORTNESS OF BREATHOR BAD COUGH.  . ranitidine (ZANTAC) 150 MG tablet Take 1 tablet (150 mg total) by mouth daily after supper.  . SYMBICORT  80-4.5 MCG/ACT inhaler USE 2 PUFFS TWICE DAILY.   No current facility-administered medications on file prior to visit.      Allergies  Allergen Reactions  . Tequin Rash    Review Of Systems:  Constitutional:   No  weight loss, night sweats,  Fevers, chills, fatigue, or  lassitude.  HEENT:   No headaches,  Difficulty swallowing,  Tooth/dental problems, or  Sore throat,                No sneezing, itching, ear ache, nasal congestion, post nasal drip,   CV:  No chest pain,  Orthopnea, PND, swelling in lower extremities, anasarca, dizziness, palpitations, syncope.   GI  No heartburn, indigestion, abdominal pain, nausea, vomiting, diarrhea, change in bowel habits, loss of appetite, bloody stools.   Resp: No shortness of breath with exertion or at rest.  No excess mucus, + productive cough,  No non-productive cough,  No coughing up of blood.  No change in color of mucus.  No wheezing.  No chest wall deformity  Skin: no rash or lesions.  GU: no dysuria, change in color of urine, no urgency or frequency.  No flank pain, no hematuria   MS:  No joint pain or swelling.  No decreased range of motion.  No back pain.  Psych:  No change in mood or affect. No depression or anxiety.  No memory loss.   Vital Signs BP 128/70 (BP Location: Left Arm, Cuff Size: Normal)   Pulse (!) 101   Ht 5\' 4"  (1.626 m)   Wt 95 lb (43.1 kg)   SpO2 92%   BMI 16.31 kg/m    Physical Exam:  General- No distress,  A&Ox3, pleasant ENT: No sinus tenderness, TM clear, pale nasal mucosa, no oral exudate,no post nasal drip, no LAN Cardiac: S1, S2, regular rate and rhythm, no murmur Chest: No wheeze/ rales/ dullness; no accessory muscle use, no nasal flaring, no sternal retractions Abd.: Soft Non-tender, flat Ext: No clubbing cyanosis, edema Neuro:  normal strength Skin: No rashes, warm and dry Psych: normal mood and behavior   Assessment/Plan  Bronchiectasis without acute exacerbation (HCC) Resolving  slow to resolve bronchiectasis exacerbation Plan: We will check a BMET today after you appointment to ensure your sodium has improved.. We will call you with results. Complete your Augmentin ( finish tomorrow) Continue using your Mucinex and chest vest twice daily. Continue Symbicort daily as you have been doing. Follow up appointment with Dr. Lake Bells 04/21/2016 to discuss CT scan necessity. Remember regular follow up appointment with Dr. Lake Bells 07/07/2016. Please contact office for sooner follow up if symptoms do not improve or worsen or seek emergency care       Magdalen Spatz, NP 04/05/2016  12:48 PM

## 2016-04-05 NOTE — Telephone Encounter (Signed)
Spoke with pt. She is aware of BQ's recommendation. OV has been scheduled for 04/21/16 at 12pm with BQ. Nothing further was needed.

## 2016-04-05 NOTE — Telephone Encounter (Signed)
We can hold off I need to see her in clinic in the next few weeks

## 2016-04-05 NOTE — Patient Instructions (Addendum)
It is good to see you again today. I am glad you are feeling better. We will check a BMET today after you appointment to ensure your sodium has improved.. We will call you with results. Complete your Augmentin ( finish tomorrow) Continue using your Mucinex and chest vest twice daily. Continue Symbicort daily as you have been doing. Follow up appointment with Dr. Lake Bells 04/21/2016 to discuss CT scan necessity. Remember regular follow up appointment with Dr. Lake Bells 07/07/2016. Please contact office for sooner follow up if symptoms do not improve or worsen or seek emergency care

## 2016-04-05 NOTE — Assessment & Plan Note (Addendum)
Resolving slow to resolve bronchiectasis exacerbation Plan: We will check a BMET today after you appointment to ensure your sodium has improved.. We will call you with results. Complete your Augmentin ( finish tomorrow) Continue using your Mucinex and chest vest twice daily. Continue Symbicort daily as you have been doing. Follow up appointment with Dr. Lake Bells 04/21/2016 to discuss CT scan necessity. Remember regular follow up appointment with Dr. Lake Bells 07/07/2016. Please contact office for sooner follow up if symptoms do not improve or worsen or seek emergency care

## 2016-04-05 NOTE — Telephone Encounter (Signed)
I have called Destiny Rodriguez with the results of her labs  this morning. I explained to her that her sodium is within normal range. She verbalized understanding and had no further questions at completion of the call.

## 2016-04-05 NOTE — Progress Notes (Signed)
Reviewed/agree

## 2016-04-06 ENCOUNTER — Encounter (HOSPITAL_COMMUNITY): Payer: Medicare Other

## 2016-04-06 ENCOUNTER — Other Ambulatory Visit: Payer: Self-pay | Admitting: Geriatric Medicine

## 2016-04-06 ENCOUNTER — Ambulatory Visit (HOSPITAL_COMMUNITY): Payer: Medicare Other | Attending: Cardiovascular Disease

## 2016-04-06 ENCOUNTER — Other Ambulatory Visit: Payer: Self-pay

## 2016-04-06 DIAGNOSIS — I517 Cardiomegaly: Secondary | ICD-10-CM | POA: Diagnosis not present

## 2016-04-06 DIAGNOSIS — R011 Cardiac murmur, unspecified: Secondary | ICD-10-CM | POA: Diagnosis not present

## 2016-04-06 DIAGNOSIS — E785 Hyperlipidemia, unspecified: Secondary | ICD-10-CM | POA: Insufficient documentation

## 2016-04-06 DIAGNOSIS — J449 Chronic obstructive pulmonary disease, unspecified: Secondary | ICD-10-CM | POA: Diagnosis not present

## 2016-04-08 ENCOUNTER — Encounter (HOSPITAL_COMMUNITY): Payer: Medicare Other

## 2016-04-13 ENCOUNTER — Encounter (HOSPITAL_COMMUNITY): Payer: Medicare Other

## 2016-04-15 ENCOUNTER — Encounter (HOSPITAL_COMMUNITY): Payer: Medicare Other

## 2016-04-20 ENCOUNTER — Encounter (HOSPITAL_COMMUNITY): Payer: Medicare Other

## 2016-04-21 ENCOUNTER — Ambulatory Visit (INDEPENDENT_AMBULATORY_CARE_PROVIDER_SITE_OTHER): Payer: Medicare Other | Admitting: Pulmonary Disease

## 2016-04-21 ENCOUNTER — Encounter: Payer: Self-pay | Admitting: Pulmonary Disease

## 2016-04-21 VITALS — BP 118/70 | HR 91 | Ht 64.0 in | Wt 95.2 lb

## 2016-04-21 DIAGNOSIS — A310 Pulmonary mycobacterial infection: Secondary | ICD-10-CM | POA: Diagnosis not present

## 2016-04-21 DIAGNOSIS — J479 Bronchiectasis, uncomplicated: Secondary | ICD-10-CM

## 2016-04-21 NOTE — Patient Instructions (Addendum)
Please provide [place] with a sample of your mucus Keep using your therapy vest twice a day Keep taking the Symbicort twice a day I will see you back in February as previously scheduled

## 2016-04-21 NOTE — Progress Notes (Signed)
Subjective:    Patient ID: Destiny Rodriguez, female    DOB: 11/10/1924, 82 y.o.   MRN: [id number]  Synopsis: Former Patient of Dr. Gwenette Rodriguez with COPD and MAC; She does better with longer courses of antibiotics (14 days) when she has flare ups. Rx with clarithro/ethamb 2000-2001 Started clarithro/ethamb/rif 08/2010  >  finished 60mos of treatment ending May 2014.  She experienced severe muscle cramps due to antibiotics.   CT chest 04/2011: large cavitary disease superimposed over bronchiectasis.  Started on MAC meds per ID 05/2011 CT chest 12/20/12 >Bronchiectasis, pulmonary parenchymal cysts,   A collapsed cyst in the left upper lobe with associated presumed pulmonary parenchymal scarring   Uses therapy vest twice a day.  Spirometry 2010: FEV1 1.26 (71%), ratio 65. Good response to LABA/ICS  AUGUST 2016 Sputum> pseudomonas, cipro sensitive August 2016 Sputum > MAC positive x2  Had pneumonia in March 2017. Refer to pulmonary rehabilitation in April 2017.  HPI Chief Complaint  Patient presents with  . Follow-up    Was told to come back in for test results. Breathing has been baseline.    Destiny Rodriguez says she feels much better.  She says that she is not quite back to where she was a year ago because she gets short of breath faster and doesn't quite have the exercise tolerence she had before.  However right now she feels "pretty good". She says in the last few days she had some cold symptoms with sneezing but this has improved.  She wonders if it was allergies.  She is not back to exercising yet and she feels guilty because she hasn't been back there yet.    She says that in the last year her weight has been stable (though low). No night sweats.    Past Medical History:  Diagnosis Date  . Allergic rhinitis   . Bronchiectasis   . Chronic sinusitis   . Hyperlipidemia   . MAC (mycobacterium avium-intracellulare complex)   . Migraine   . Rheumatism       Review of Systems    Constitutional: Negative for chills, fatigue and fever.  HENT: Negative for postnasal drip, rhinorrhea and sinus pressure.   Respiratory: Negative for cough, shortness of breath and wheezing.   Cardiovascular: Negative for chest pain, palpitations and leg swelling.       Objective:   Physical Exam Vitals:   04/21/16 1217  BP: 118/70  Pulse: 91  SpO2: 93%  Weight: 95 lb 3.2 oz (43.2 kg)  Height: 5\' 4"  (1.626 m)  RA  Gen: well appearing HENT: OP clear, TM's clear, neck supple PULM: Crackles both bases bilaterally CV: RRR, slight systolic murmur RUSB, and LLSB, trace edema GI: BS+, soft, nontender Derm: no cyanosis or rash Psyche: normal mood and affect       Assessment & Plan:  Mycobacterium avium-intracellulare complex (HCC) Sputum cultures in August 2016 were positive for MAI. However, her weight has been stable this year and she has experienced no fevers, chills, or night sweats. Though she experienced a flareup of her bronchiectasis a few times it's not clear to me that the MAI is active again. She is reluctant to go back on treatment which is completely reasonable.  Plan: Continue monitoring with out treatment of MAI Sample mucus again for MAI now  Bronchiectasis without acute exacerbation [facility]) She had an exacerbation back in August but she is back to baseline now.  Plan: Continue therapy vest Continue Symbicort Repeat sputum culture to guide treatment  for the next flareup > 50% of visit spent face to face, 26 minute visit   Current Outpatient Prescriptions:  .  albuterol (PROVENTIL) (2.5 MG/3ML) 0.083% nebulizer solution, Take 3 mLs (2.5 mg total) by nebulization every 6 (six) hours as needed for wheezing or shortness of breath., Disp: 360 mL, Rfl: 11 .  Biotin 1000 MCG tablet, Take 1,000 mcg by mouth daily., Disp: , Rfl:  .  calcium carbonate (OS-CAL) 600 MG TABS, Take 600 mg by mouth 2 (two) times daily with a meal. , Disp: , Rfl:  .  chlorpheniramine  (CHLOR-TRIMETON) 4 MG tablet, Take 4 mg by mouth at bedtime., Disp: , Rfl:  .  guaiFENesin (MUCINEX) 600 MG 12 hr tablet, Take 2 tablets (1,200 mg total) by mouth 2 (two) times daily. (Patient taking differently: Take 1,200 mg by mouth 2 (two) times daily as needed. ), Disp: , Rfl:  .  HYDROcodone-homatropine (HYCODAN) 5-1.5 MG/5ML syrup, Take 5 mLs by mouth at bedtime as needed for cough., Disp: 180 mL, Rfl: 0 .  Liniments (SALONPAS PAIN RELIEF PATCH EX), Apply topically as needed. Reported on 12/05/2015, Disp: , Rfl:  .  MULTIPLE VITAMIN PO, Take 1 tablet by mouth daily. , Disp: , Rfl:  .  mupirocin cream (BACTROBAN) 2 %, Apply 1 application topically 2 (two) times daily. To affected area as needed, Disp: 15 g, Rfl: 0 .  OXYGEN, Inhale into the lungs at bedtime., Disp: , Rfl:  .  PROAIR HFA 108 (90 Base) MCG/ACT inhaler, USE 2 PUFFS EVERY 4 HOURS AS NEEDED FOR WHEEZE OR SHORTNESS OF BREATHOR BAD COUGH., Disp: 8.5 g, Rfl: 0 .  ranitidine (ZANTAC) 150 MG tablet, Take 1 tablet (150 mg total) by mouth daily after supper., Disp: 30 tablet, Rfl: 1 .  SYMBICORT 80-4.5 MCG/ACT inhaler, USE 2 PUFFS TWICE DAILY., Disp: 10.2 g, Rfl: 5

## 2016-04-21 NOTE — Assessment & Plan Note (Signed)
Sputum cultures in August 2016 were positive for MAI. However, her weight has been stable this year and she has experienced no fevers, chills, or night sweats. Though she experienced a flareup of her bronchiectasis a few times it's not clear to me that the MAI is active again. She is reluctant to go back on treatment which is completely reasonable.  Plan: Continue monitoring with out treatment of MAI Sample mucus again for MAI now

## 2016-04-21 NOTE — Assessment & Plan Note (Signed)
She had an exacerbation back in August but she is back to baseline now.  Plan: Continue therapy vest Continue Symbicort Repeat sputum culture to guide treatment for the next flareup

## 2016-04-22 ENCOUNTER — Other Ambulatory Visit: Payer: Medicare Other

## 2016-04-22 ENCOUNTER — Encounter (HOSPITAL_COMMUNITY): Payer: Medicare Other

## 2016-04-22 DIAGNOSIS — A310 Pulmonary mycobacterial infection: Secondary | ICD-10-CM | POA: Diagnosis not present

## 2016-04-25 LAB — RESPIRATORY CULTURE OR RESPIRATORY AND SPUTUM CULTURE: Organism ID, Bacteria: NORMAL

## 2016-04-27 ENCOUNTER — Encounter (HOSPITAL_COMMUNITY): Payer: Medicare Other

## 2016-04-29 ENCOUNTER — Encounter (HOSPITAL_COMMUNITY): Payer: Medicare Other

## 2016-05-04 ENCOUNTER — Encounter (HOSPITAL_COMMUNITY): Payer: Medicare Other

## 2016-05-06 ENCOUNTER — Encounter (HOSPITAL_COMMUNITY): Payer: Medicare Other

## 2016-05-11 ENCOUNTER — Encounter (HOSPITAL_COMMUNITY): Payer: Medicare Other

## 2016-05-13 ENCOUNTER — Encounter (HOSPITAL_COMMUNITY): Payer: Medicare Other

## 2016-05-18 ENCOUNTER — Encounter (HOSPITAL_COMMUNITY): Payer: Medicare Other

## 2016-05-20 ENCOUNTER — Encounter (HOSPITAL_COMMUNITY): Payer: Medicare Other

## 2016-05-25 ENCOUNTER — Encounter (HOSPITAL_COMMUNITY): Payer: Medicare Other

## 2016-05-27 ENCOUNTER — Encounter: Payer: Self-pay | Admitting: Internal Medicine

## 2016-05-27 ENCOUNTER — Ambulatory Visit (INDEPENDENT_AMBULATORY_CARE_PROVIDER_SITE_OTHER): Payer: Medicare Other | Admitting: Internal Medicine

## 2016-05-27 VITALS — BP 145/77 | HR 92 | Temp 98.0°F | Ht 64.5 in | Wt 94.8 lb

## 2016-05-27 DIAGNOSIS — A310 Pulmonary mycobacterial infection: Secondary | ICD-10-CM

## 2016-05-27 DIAGNOSIS — R5381 Other malaise: Secondary | ICD-10-CM | POA: Diagnosis not present

## 2016-05-27 NOTE — Progress Notes (Signed)
RFV: follow up for pulmonary MAC  Patient ID: Destiny Rodriguez, female   DOB: 12/20/1924, 83 y.o.   MRN: [id number]  HPI Destiny Rodriguez is an 83yo F with hx of pulmonary MAC, and bronchiectasis. She was last screened in 11/30 respiratory sputum showed NOPF at time she was asymptomatic Seems at bl. Encouraged more exercise Outpatient Encounter Prescriptions as of 05/27/2016  Medication Sig  . albuterol (PROVENTIL) (2.5 MG/3ML) 0.083% nebulizer solution Take 3 mLs (2.5 mg total) by nebulization every 6 (six) hours as needed for wheezing or shortness of breath.  . Biotin 1000 MCG tablet Take 1,000 mcg by mouth daily.  . calcium carbonate (OS-CAL) 600 MG TABS Take 600 mg by mouth 2 (two) times daily with a meal.   . chlorpheniramine (CHLOR-TRIMETON) 4 MG tablet Take 4 mg by mouth at bedtime.  Marland Kitchen guaiFENesin (MUCINEX) 600 MG 12 hr tablet Take 2 tablets (1,200 mg total) by mouth 2 (two) times daily. (Patient taking differently: Take 1,200 mg by mouth 2 (two) times daily as needed. )  . HYDROcodone-homatropine (HYCODAN) 5-1.5 MG/5ML syrup Take 5 mLs by mouth at bedtime as needed for cough.  . MULTIPLE VITAMIN PO Take 1 tablet by mouth daily.   . mupirocin cream (BACTROBAN) 2 % Apply 1 application topically 2 (two) times daily. To affected area as needed  . OXYGEN Inhale into the lungs at bedtime.  Marland Kitchen PROAIR HFA 108 (90 Base) MCG/ACT inhaler USE 2 PUFFS EVERY 4 HOURS AS NEEDED FOR WHEEZE OR SHORTNESS OF BREATHOR BAD COUGH.  . ranitidine (ZANTAC) 150 MG tablet Take 1 tablet (150 mg total) by mouth daily after supper.  . Liniments (SALONPAS PAIN RELIEF PATCH EX) Apply topically as needed. Reported on 12/05/2015  . SYMBICORT 80-4.5 MCG/ACT inhaler USE 2 PUFFS TWICE DAILY.   No facility-administered encounter medications on file as of 05/27/2016.      Patient Active Problem List   Diagnosis Date Noted  . Hyponatremia 03/26/2016  . Fatigue 02/16/2016  . Chronic respiratory failure with hypoxia ([place])  11/27/2015  . Hemoptysis 12/07/2013  . Pneumonia 12/06/2013  . Black stool 11/23/2010  . Mycobacterium avium-intracellulare complex ([place]) 06/07/2008  . SINUSITIS, CHRONIC 06/07/2008  . ALLERGIC RHINITIS 06/07/2008  . COPD (chronic obstructive pulmonary disease) with chronic bronchitis ([place]) 06/07/2008  . HYPERLIPIDEMIA 03/25/2007  . Bronchiectasis without acute exacerbation ([place]) 03/25/2007  . HIATAL HERNIA 03/25/2007     Health Maintenance Due  Topic Date Due  . DEXA SCAN  08/25/1991     Review of Systems  Constitutional: Negative for fever, chills, diaphoresis, activity change, appetite change, fatigue and unexpected weight change.  HENT: Negative for congestion, sore throat, rhinorrhea, sneezing, trouble swallowing and sinus pressure.  Eyes: Negative for photophobia and visual disturbance.  Respiratory: Negative for cough, chest tightness, shortness of breath, wheezing and stridor.  Cardiovascular: Negative for chest pain, palpitations and leg swelling.  Gastrointestinal: Negative for nausea, vomiting, abdominal pain, diarrhea, constipation, blood in stool, abdominal distention and anal bleeding.  Genitourinary: Negative for dysuria, hematuria, flank pain and difficulty urinating.  Musculoskeletal: Negative for myalgias, back pain, joint swelling, arthralgias and gait problem.  Skin: Negative for color change, pallor, rash and wound.  Neurological: Negative for dizziness, tremors, weakness and light-headedness.  Hematological: Negative for adenopathy. Does not bruise/bleed easily.  Psychiatric/Behavioral: Negative for behavioral problems, confusion, sleep disturbance, dysphoric mood, decreased concentration and agitation.    Physical Exam   BP (!) 145/77   Pulse 92   Temp 98 F (36.7 C) (  Oral)   Ht 5' 4.5" (1.638 m)   Wt 94 lb 12.8 oz (43 kg)   BMI 16.02 kg/m   Physical Exam  Constitutional:  oriented to person, place, and time. appears thin. No distress.  HENT:  [place]/AT, PERRLA, no scleral icterus Mouth/Throat: Oropharynx is clear and moist. No oropharyngeal exudate.  Cardiovascular: Normal rate, regular rhythm and normal heart sounds. Exam reveals no gallop and no friction rub.  No murmur heard.  Pulmonary/Chest: Effort normal and breath sounds normal. No respiratory distress.  has no wheezes.  Neck = supple, no nuchal rigidity Abdominal: Soft. Bowel sounds are normal.  exhibits no distension. There is no tenderness.  Lymphadenopathy: no cervical adenopathy. No axillary adenopathy Neurological: alert and oriented to person, place, and time.  Skin: Skin is warm and dry. No rash noted. No erythema.  Psychiatric: a normal mood and affect.  behavior is normal.   CBC Lab Results  Component Value Date   WBC 14.8 (H) 03/26/2016   RBC 4.59 03/26/2016   HGB 14.1 03/26/2016   HCT 42.6 03/26/2016   PLT 197.0 03/26/2016   MCV 92.9 03/26/2016   MCH 30.7 12/07/2013   MCHC 33.2 03/26/2016   RDW 14.2 03/26/2016   LYMPHSABS 1.4 03/26/2016   MONOABS 2.0 (H) 03/26/2016   EOSABS 0.1 03/26/2016    BMET Lab Results  Component Value Date   NA 136 04/05/2016   K 4.7 04/05/2016   CL 97 04/05/2016   CO2 34 (H) 04/05/2016   GLUCOSE 99 04/05/2016   BUN 13 04/05/2016   CREATININE 0.56 04/05/2016   CALCIUM 9.5 04/05/2016   GFRNONAA 78 (L) 12/07/2013   GFRAA >90 12/07/2013      Assessment and Plan Pulmonary MAC = she appears to be at her baseline, does have occasional cough and shortness of breath on exertion. She reports mainly fatigue. She has been off of her exercise regimen and no longer participating in pulm rehab. In part, she maybe deconditioned  With the new year, she is planning on getting back to ambulating at indoor gym at her complex.  wil see back in 3 mo. She sees mcquaid next month.

## 2016-06-16 ENCOUNTER — Telehealth: Payer: Self-pay | Admitting: Acute Care

## 2016-06-16 ENCOUNTER — Ambulatory Visit (INDEPENDENT_AMBULATORY_CARE_PROVIDER_SITE_OTHER): Payer: Medicare Other | Admitting: Acute Care

## 2016-06-16 ENCOUNTER — Encounter: Payer: Self-pay | Admitting: Acute Care

## 2016-06-16 DIAGNOSIS — R5383 Other fatigue: Secondary | ICD-10-CM | POA: Diagnosis not present

## 2016-06-16 DIAGNOSIS — J479 Bronchiectasis, uncomplicated: Secondary | ICD-10-CM

## 2016-06-16 DIAGNOSIS — J9611 Chronic respiratory failure with hypoxia: Secondary | ICD-10-CM

## 2016-06-16 DIAGNOSIS — E440 Moderate protein-calorie malnutrition: Secondary | ICD-10-CM | POA: Diagnosis not present

## 2016-06-16 DIAGNOSIS — E46 Unspecified protein-calorie malnutrition: Secondary | ICD-10-CM | POA: Insufficient documentation

## 2016-06-16 MED ORDER — MOMETASONE FURO-FORMOTEROL FUM 200-5 MCG/ACT IN AERO: 2.0000 | INHALATION_SPRAY | Freq: Two times a day (BID) | RESPIRATORY_TRACT | 0 refills | Status: DC

## 2016-06-16 NOTE — Assessment & Plan Note (Signed)
Continued fatigue Last HGB 14.8 03/2016 Thyroid function ordered 01/2016, but I do not see result Plan: TSH lab draw.

## 2016-06-16 NOTE — Assessment & Plan Note (Addendum)
2 pound Weight loss over the last 2 months Poor Appetite Plan: Boost supplement 1-2 times daily Small frequent meals May need referral to dietitian

## 2016-06-16 NOTE — Patient Instructions (Addendum)
It is good to see you today. Continue using your Mucinex daily as you have been doing. Add Flonase 2 puffs once daily in each nostril Continue Chlor trimeton at bedtime. Continue zantac at bedtime. Continue chest vest twice daily. We will do a therapeutic trial of Dulera 200  2 puffs twice daily Use your spacer  Call if this makes your mouth sore, so we can decrease to lower dosage. Stop Symbicort while on Dulera Add Boost 1-2 times daily. Frequent small meals. Follow up appointment in 1 month with Reigna Ruperto  or Dr. Lake Bells Schedule with Dr. Lake Bells for regular maintenance that was cancelled ( by office ) on 07/07/2016. Please contact office for sooner follow up if symptoms do not improve or worsen or seek emergency care

## 2016-06-16 NOTE — Assessment & Plan Note (Signed)
Worsening Dyspnea and fatigue No worsening of secretion volume/ change in color 04/22/16 respiratory sputum showed NOPF at time she was asymptomatic Plan: Continue using your Mucinex daily as you have been doing. Add Flonase 2 puffs once daily in each nostril Continue Chlor trimeton at bedtime. Continue zantac at bedtime. Continue chest vest twice daily. We will do a therapeutic trial of Dulera 200  2 puffs twice daily Use your spacer  Call if this makes your mouth sore, so we can decrease to lower dosage. Stop Symbicort while on Dulera Add Boost 1-2 times daily. Frequent small meals. Follow up appointment in 1 month with Destiny Rodriguez  or Destiny Rodriguez Schedule with Destiny Rodriguez for regular maintenance that was cancelled ( by office ) on 07/07/2016. Please contact office for sooner follow up if symptoms do not improve or worsen or seek emergency care

## 2016-06-16 NOTE — Progress Notes (Signed)
History of Present Illness Destiny Rodriguez is a 83 y.o. female with bronchiectasis, COPD, and MAC infection vs colonization.She finished treatment in April 2014.She is followed by Dr. Lake Bells.    Synopsis: Former Patient of Dr. Gwenette Greet with COPD and MAC; She does better with longer courses of antibiotics (14 days) when she has flare ups. Rx with clarithro/ethamb 2000-2001 Started clarithro/ethamb/rif 08/2010  >  finished 18mo of treatment ending May 2014.  She experienced severe muscle cramps due to antibiotics.   CT chest 04/2011: large cavitary disease superimposed over bronchiectasis.  Started on MAC meds per ID 05/2011 CT chest 12/20/12 >Bronchiectasis, pulmonary parenchymal cysts,   A collapsed cyst in the left upper lobe with associated presumed pulmonary parenchymal scarring   Uses therapy vest twice a day.  Spirometry 2010: FEV1 1.26 (71%), ratio 65. Good response to LABA/ICS  AUGUST 2016 Sputum> pseudomonas, cipro sensitive August 2016 Sputum > MAC positive x2  Had pneumonia in March 2017. Refer to pulmonary rehabilitation in April 2017.  06/16/2016 Acute OV for worsening dyspnea:  Pt presents today with a primary complaint of worsening fatigue over the last several months and slight worsening of cough and shortness of breath ..Marland Kitchenhe is unable [facility]. She states that she is short of breath without having oxygen desaturations. Oxygen saturations are 90% today in the office.She has been trying to walk daily but she states that she becomes too fatigued with activity to continue. This concerns her as she tries to exercise for her bronchiectasis.She does get some relief with her rescue inhaler. She is frustrated because her fatigue is impacting her quality of life. She is compliant with her Symbicort. She is using her rescue inhaler occasionally. She is compliant with her Mucinex. She is having worsening cough. She is  coughing up light to tan secretions usually in the middle of the day.She denies fever.She is taking Zantac  every night and is compliant with her chest vest.She states she has continued poor appetite, with weight loss of about 2 pounds.She is currently not actively treating her MAC, as she is concerned she will not be able to tolerate the antibiotic regimen if she needs to resume treatment..She is followed by ID. She denies chest pain, fever, orthopnea or hemoptysis.Last HGB check was 03/26/2016 and it was 14.8 at the time.  She states that her insurance may make her DRuthe Mannanavailable through her plan this year.She states she felt Dulera worked better for her than the Symbicort. We will do a therapeutic trial of Dulera to see if this improves her dyspnea.    Past medical hx Past Medical History:  Diagnosis Date  . Allergic rhinitis   . Bronchiectasis   . Chronic sinusitis   . Hyperlipidemia   . MAC (mycobacterium avium-intracellulare complex)   . Migraine   . Rheumatism      Past surgical hx, Family hx, Social hx all reviewed.  Current Outpatient Prescriptions on File Prior to Visit  Medication Sig  . albuterol (PROVENTIL) (2.5 MG/3ML) 0.083% nebulizer solution Take 3 mLs (2.5 mg total) by nebulization every 6 (six) hours as needed for wheezing or shortness of breath.  . Biotin 1000 MCG tablet Take 1,000 mcg by mouth daily.  . calcium carbonate (OS-CAL) 600 MG TABS Take 600 mg by mouth 2 (two) times daily with a meal.   . chlorpheniramine (CHLOR-TRIMETON) 4 MG tablet Take 4 mg by mouth at bedtime.  .Marland KitchenguaiFENesin (MWaterproof  600 MG 12 hr tablet Take 2 tablets (1,200 mg total) by mouth 2 (two) times daily. (Patient taking differently: Take 1,200 mg by mouth 2 (two) times daily as needed. )  . HYDROcodone-homatropine (HYCODAN) 5-1.5 MG/5ML syrup Take 5 mLs by mouth at bedtime as needed for cough.  . Liniments (SALONPAS PAIN RELIEF PATCH EX) Apply topically as needed. Reported on 12/05/2015  .  MULTIPLE VITAMIN PO Take 1 tablet by mouth daily.   . mupirocin cream (BACTROBAN) 2 % Apply 1 application topically 2 (two) times daily. To affected area as needed  . OXYGEN Inhale into the lungs at bedtime.  Marland Kitchen PROAIR HFA 108 (90 Base) MCG/ACT inhaler USE 2 PUFFS EVERY 4 HOURS AS NEEDED FOR WHEEZE OR SHORTNESS OF BREATHOR BAD COUGH.  . ranitidine (ZANTAC) 150 MG tablet Take 1 tablet (150 mg total) by mouth daily after supper.  . SYMBICORT 80-4.5 MCG/ACT inhaler USE 2 PUFFS TWICE DAILY.   No current facility-administered medications on file prior to visit.      Allergies  Allergen Reactions  . Tequin Rash    Review Of Systems:  Constitutional:   +  weight loss, No night sweats,  Fevers, chills, +fatigue, or  lassitude.  HEENT:   No headaches,  Difficulty swallowing,  Tooth/dental problems, or  Sore throat,                No sneezing, itching, ear ache, nasal congestion, +post nasal drip,   CV:  No chest pain,  Orthopnea, PND, swelling in lower extremities, anasarca, dizziness, palpitations, syncope.   GI  No heartburn, indigestion, abdominal pain, nausea, vomiting, diarrhea, change in bowel habits,+ loss of appetite, bloody stools.   Resp: + shortness of breath with exertion less at rest.  + excess mucus ( Baseline), + productive cough( Baseline),  No non-productive cough,  No coughing up of blood.  No change in color of mucus.  No wheezing.  No chest wall deformity  Skin: no rash or lesions.  GU: no dysuria, change in color of urine, no urgency or frequency.  No flank pain, no hematuria   MS:  No joint pain or swelling.  No decreased range of motion.  No back pain.  Psych:  No change in mood or affect. No depression or anxiety.  No memory loss.   Vital Signs BP 120/60 (BP Location: Left Arm, Cuff Size: Normal)   Pulse 100   Ht _0  (1.626 m)   Wt 92 lb 12.8 oz (42.1 kg)   SpO2 90%   BMI 15.93 kg/m    Physical Exam:  General- No distress,  A&Ox3, frail elderly female,  thin ENT: No sinus tenderness, TM clear, pale nasal mucosa, no oral exudate,+no post nasal drip, no LAN Cardiac: S1, S2, regular rate and rhythm, no murmur Chest: No wheeze/ rales/ dullness; no accessory muscle use, no nasal flaring, no sternal retractions Abd.: Soft Non-tender, flat Ext: No clubbing cyanosis, edema Neuro:  Deconditioned at baseline Skin: No rashes, warm and dry Psych: normal mood and behavior, concerned about fatigue.   Assessment/Plan  Fatigue Continued fatigue Last HGB 14.8 03/2016 Thyroid function ordered 01/2016, but I do not see result Plan: TSH lab draw.  Chronic respiratory failure with hypoxia (HCC) Continued benefit of treatment with nocturnal oxygen Plan: Continue Nocturnal oxygen at 2L each night.  Bronchiectasis without acute exacerbation (HCC) Worsening Dyspnea and fatigue No worsening of secretion volume/ change in color 04/22/16 respiratory sputum showed NOPF at time she was asymptomatic Plan:  Continue using your Mucinex daily as you have been doing. Add Flonase 2 puffs once daily in each nostril Continue Chlor trimeton at bedtime. Continue zantac at bedtime. Continue chest vest twice daily. We will do a therapeutic trial of Dulera 200  2 puffs twice daily Use your spacer  Call if this makes your mouth sore, so we can decrease to lower dosage. Stop Symbicort while on Dulera Add Boost 1-2 times daily. Frequent small meals. Follow up appointment in 1 month with Kierston Plasencia  or Dr. Lake Bells Schedule with Dr. Lake Bells for regular maintenance that was cancelled ( by office ) on 07/07/2016. Please contact office for sooner follow up if symptoms do not improve or worsen or seek emergency care     Protein-calorie malnutrition ([place]) 2 pound Weight loss over the last 2 months Poor Appetite Plan: Boost supplement 1-2 times daily Small frequent meals May need referral to dietitian    Magdalen Spatz, NP 06/16/2016  8:22 PM

## 2016-06-16 NOTE — Telephone Encounter (Signed)
Please  Call  patient and have her come in this week for labs and CXR. Please order TSH and  CXR.Thanks so much.

## 2016-06-16 NOTE — Assessment & Plan Note (Addendum)
Continued benefit of treatment with nocturnal oxygen Plan: Continue Nocturnal oxygen at 2L each night.

## 2016-06-17 ENCOUNTER — Ambulatory Visit (INDEPENDENT_AMBULATORY_CARE_PROVIDER_SITE_OTHER)
Admission: RE | Admit: 2016-06-17 | Discharge: 2016-06-17 | Disposition: A | Discharge status: Home / Self Care | Payer: Medicare Other | Source: Ambulatory Visit | Attending: Acute Care | Admitting: Acute Care

## 2016-06-17 ENCOUNTER — Other Ambulatory Visit (INDEPENDENT_AMBULATORY_CARE_PROVIDER_SITE_OTHER): Payer: Medicare Other

## 2016-06-17 DIAGNOSIS — R5383 Other fatigue: Secondary | ICD-10-CM

## 2016-06-17 DIAGNOSIS — R05 Cough: Secondary | ICD-10-CM | POA: Diagnosis not present

## 2016-06-17 DIAGNOSIS — R0602 Shortness of breath: Secondary | ICD-10-CM | POA: Diagnosis not present

## 2016-06-17 LAB — TSH: TSH: 1.84 u[IU]/mL (ref 0.35–4.50)

## 2016-06-17 NOTE — Telephone Encounter (Signed)
Spoke with pt and advised her SG wanted her to come in for labs and xray. Pt agreed and orders were placed.

## 2016-06-20 NOTE — Progress Notes (Signed)
Reviewed, agree 

## 2016-07-01 ENCOUNTER — Other Ambulatory Visit: Payer: Self-pay | Admitting: Geriatric Medicine

## 2016-07-01 DIAGNOSIS — J479 Bronchiectasis, uncomplicated: Secondary | ICD-10-CM | POA: Diagnosis not present

## 2016-07-01 DIAGNOSIS — E46 Unspecified protein-calorie malnutrition: Secondary | ICD-10-CM | POA: Diagnosis not present

## 2016-07-01 DIAGNOSIS — A310 Pulmonary mycobacterial infection: Secondary | ICD-10-CM | POA: Diagnosis not present

## 2016-07-01 DIAGNOSIS — K449 Diaphragmatic hernia without obstruction or gangrene: Secondary | ICD-10-CM | POA: Diagnosis not present

## 2016-07-02 ENCOUNTER — Ambulatory Visit
Admission: RE | Admit: 2016-07-02 | Discharge: 2016-07-02 | Disposition: A | Discharge status: Home / Self Care | Payer: Medicare Other | Source: Ambulatory Visit | Attending: Geriatric Medicine | Admitting: Geriatric Medicine

## 2016-07-02 DIAGNOSIS — K219 Gastro-esophageal reflux disease without esophagitis: Secondary | ICD-10-CM | POA: Diagnosis not present

## 2016-07-02 DIAGNOSIS — K449 Diaphragmatic hernia without obstruction or gangrene: Secondary | ICD-10-CM

## 2016-07-07 ENCOUNTER — Ambulatory Visit: Payer: BLUE CROSS/BLUE SHIELD | Admitting: Pulmonary Disease

## 2016-07-21 ENCOUNTER — Ambulatory Visit (INDEPENDENT_AMBULATORY_CARE_PROVIDER_SITE_OTHER): Payer: Medicare Other | Admitting: Pulmonary Disease

## 2016-07-21 ENCOUNTER — Encounter: Payer: Self-pay | Admitting: Pulmonary Disease

## 2016-07-21 VITALS — BP 108/68 | HR 91 | Ht 64.0 in | Wt 89.8 lb

## 2016-07-21 DIAGNOSIS — J479 Bronchiectasis, uncomplicated: Secondary | ICD-10-CM | POA: Diagnosis not present

## 2016-07-21 DIAGNOSIS — R0600 Dyspnea, unspecified: Secondary | ICD-10-CM

## 2016-07-21 DIAGNOSIS — A310 Pulmonary mycobacterial infection: Secondary | ICD-10-CM

## 2016-07-21 DIAGNOSIS — J9611 Chronic respiratory failure with hypoxia: Secondary | ICD-10-CM | POA: Diagnosis not present

## 2016-07-21 DIAGNOSIS — R0789 Other chest pain: Secondary | ICD-10-CM | POA: Diagnosis not present

## 2016-07-21 MED ORDER — MOMETASONE FURO-FORMOTEROL FUM 200-5 MCG/ACT IN AERO: 2.0000 | INHALATION_SPRAY | Freq: Two times a day (BID) | RESPIRATORY_TRACT | 11 refills | Status: AC

## 2016-07-21 NOTE — Assessment & Plan Note (Signed)
Lung function testing today shows evidence of severe airflow obstruction. Dulera did not necessarily improve her symptoms but she had significantly less side effects (mouth sores) while taking this medicine.  Plan: Stop Symbicort Continue high-dose Dulera 2 puffs twice a day

## 2016-07-21 NOTE — Addendum Note (Signed)
Addended by: Len Blalock on: 07/21/2016 05:10 PM   Modules accepted: Orders

## 2016-07-21 NOTE — Patient Instructions (Signed)
We will arrange for a cardiac stress test Use 2 L of oxygen when you exert yourself We will see you back in about 2-3 weeks to go over the results of the stress test and then discuss whether or not you should undergo treatment for the mycobacterial disease again. You may want to have your husband come with you to that visit.

## 2016-07-21 NOTE — Assessment & Plan Note (Signed)
Today ambulatory oximetry monitoring confirmed desaturation with exertion on room air. Start 2 L nasal cannula with exertion, continue 2 L with sleep.

## 2016-07-21 NOTE — Addendum Note (Signed)
Addended by: Len Blalock on: 07/21/2016 04:53 PM   Modules accepted: Orders

## 2016-07-21 NOTE — Progress Notes (Signed)
Subjective:    Patient ID: Destiny Rodriguez, female    DOB: 23-Oct-1924, 83 y.o.   MRN: [id number]  Synopsis: Former Patient of Dr. Gwenette Greet with COPD and MAC; She does better with longer courses of antibiotics (14 days) when she has flare ups. Rx with clarithro/ethamb 2000-2001 Started clarithro/ethamb/rif 08/2010  >  finished 56mo of treatment ending May 2014.  She experienced severe muscle cramps due to antibiotics.      Uses therapy vest twice a day.  Spirometry 2010: FEV1 1.26 (71%), ratio 65. Spirometry 2018 ratio 59%, FEV1 0.79 L 49% predicted Good response to LABA/ICS  AUGUST 2016 Sputum> pseudomonas, cipro sensitive August 2016 Sputum > MAC positive x2  Had pneumonia in March 2017. Refer to pulmonary rehabilitation in April 2017.  HPI Chief Complaint  Patient presents with  . Follow-up    1 month follow up. SOB has increased. Coughing has been about the same. Denies any chest pain.    Destiny Rodriguez feels that she is so short of breath she can't walk down the hall or making a sandwich without feeling dyspnea. She feels it has really worsened in the last few months.  She tried DRuthe Mannanrecently and she said she didn't have nearly as many sores in her mouth when she was taking it and her cough was a little better controlled.  [facility]    She has a nebulizer machine at home but she doesn't use it.  She says that she feels congestion in her chest.  Some days she will cough up more than others, but this doesn't correlate with her dyspnea.    Denies recent fever, chills, weight loss or change in mucus production.    She says that her dyspnea has been the primary progressive problem she has experienced in the last year.    Past Medical History:  Diagnosis Date  . Allergic rhinitis   . Bronchiectasis   . Chronic sinusitis   . Hyperlipidemia   . MAC (mycobacterium avium-intracellulare complex)   . Migraine   . Rheumatism       Review of Systems    Constitutional: Negative for chills, fatigue and fever.  HENT: Negative for postnasal drip, rhinorrhea and sinus pressure.   Respiratory: Negative for cough, shortness of breath and wheezing.   Cardiovascular: Negative for chest pain, palpitations and leg swelling.       Objective:   Physical Exam Vitals:   07/21/16 1544  BP: 108/68  Pulse: 91  SpO2: 93%  Weight: 89 lb 12.8 oz (40.7 kg)  Height: 5' 4"  (1.626 m)  RA  Ambulated 200 feet on room air and her O2 saturation dropped down to 88%, she had to stop due to dyspnea.  Gen: chronically ill appearing HENT: OP clear, TM's clear, neck supple PULM: Crackles bases B, normal percussion CV: RRR, no mgr, trace edema GI: BS+, soft, nontender Derm: no cyanosis or rash Psyche: normal mood and affect    Records from her January 2018 visit with [facility] were she had labs drawn for anemia, hypothyroidism. [facility]     Imaging: January 2018 chest x-ray images independently reviewed showing what appears to be emphysematous changes with chronic cavity lesions in the upper lobes. CT chest 04/2011: large cavitary disease superimposed over bronchiectasis.  Started on MAC meds per ID 05/2011 CT chest 12/20/12 >Bronchiectasis, pulmonary parenchymal cysts,   A collapsed cyst in the left upper lobe with associated presumed pulmonary  parenchymal scarring   TSH from January 2018 within normal limits.  06/2016 Upper GI barium swallow > small amount of gastroesophageal reflux and small hiatal hernia  Echo: 03/2016 LVEF 55-60%, LVH mild, valves OK, no RV abnormality   Assessment & Plan:  Mycobacterium avium-intracellulare complex ([place]) Though she has a few features to suggest active disease other than dyspnea and weight loss, we may end up treating this condition if we can come up with another cause for her dyspnea. She is reluctant to start treatment now because of the side effects she had  recently.  Bronchiectasis without acute exacerbation ([place]) Lung function testing today shows evidence of severe airflow obstruction. Dulera did not necessarily improve her symptoms but she had significantly less side effects (mouth sores) while taking this medicine.  Plan: Stop Symbicort Continue high-dose Dulera 2 puffs twice a day   Chronic respiratory failure with hypoxia (HCC) Today ambulatory oximetry monitoring confirmed desaturation with exertion on room air. Start 2 L nasal cannula with exertion, continue 2 L with sleep.  Dyspnea: Has been severe and worsening lately. Clearly some degree of worsening bronchiectasis and airflow obstruction or contributing. I also question the possibility of concomitant coronary artery disease. From a medical standpoint I fear that this represents end-stage bronchiectasis. It's not clear to me the treating with mycobacterial therapy is going to be effective but if we cannot identify another cardiac cause then it may be worth another trial of treatment of MAC.  Plan: Nuclear stress test Follow-up 2-3 weeks to discuss starting treatment for MAC  > 50% of this 47 minute visit spent face to face, I also called Dr. Felipa Eth to discuss in the office today   Current Outpatient Prescriptions:  .  albuterol (PROVENTIL) (2.5 MG/3ML) 0.083% nebulizer solution, Take 3 mLs (2.5 mg total) by nebulization every 6 (six) hours as needed for wheezing or shortness of breath., Disp: 360 mL, Rfl: 11 .  Biotin 1000 MCG tablet, Take 1,000 mcg by mouth daily., Disp: , Rfl:  .  calcium carbonate (OS-CAL) 600 MG TABS, Take 600 mg by mouth 2 (two) times daily with a meal. , Disp: , Rfl:  .  chlorpheniramine (CHLOR-TRIMETON) 4 MG tablet, Take 4 mg by mouth at bedtime., Disp: , Rfl:  .  guaiFENesin (MUCINEX) 600 MG 12 hr tablet, Take 2 tablets (1,200 mg total) by mouth 2 (two) times daily. (Patient taking differently: Take 1,200 mg by mouth 2 (two) times daily as needed. ),  Disp: , Rfl:  .  HYDROcodone-homatropine (HYCODAN) 5-1.5 MG/5ML syrup, Take 5 mLs by mouth at bedtime as needed for cough., Disp: 180 mL, Rfl: 0 .  Liniments (SALONPAS PAIN RELIEF PATCH EX), Apply topically as needed. Reported on 12/05/2015, Disp: , Rfl:  .  mometasone-formoterol (DULERA) 200-5 MCG/ACT AERO, Inhale 2 puffs into the lungs 2 (two) times daily., Disp: 1 Inhaler, Rfl: 0 .  MULTIPLE VITAMIN PO, Take 1 tablet by mouth daily. , Disp: , Rfl:  .  mupirocin cream (BACTROBAN) 2 %, Apply 1 application topically 2 (two) times daily. To affected area as needed, Disp: 15 g, Rfl: 0 .  OXYGEN, Inhale into the lungs at bedtime., Disp: , Rfl:  .  PROAIR HFA 108 (90 Base) MCG/ACT inhaler, USE 2 PUFFS EVERY 4 HOURS AS NEEDED FOR WHEEZE OR SHORTNESS OF BREATHOR BAD COUGH., Disp: 8.5 g, Rfl: 0 .  ranitidine (ZANTAC) 150 MG tablet, Take 1 tablet (150 mg total) by mouth daily after supper., Disp: 30 tablet, Rfl: 1 .  SYMBICORT 80-4.5 MCG/ACT inhaler, USE 2 PUFFS TWICE DAILY., Disp: 10.2 g, Rfl: 5

## 2016-07-21 NOTE — Assessment & Plan Note (Signed)
Though she has a few features to suggest active disease other than dyspnea and weight loss, we may end up treating this condition if we can come up with another cause for her dyspnea. She is reluctant to start treatment now because of the side effects she had recently.

## 2016-07-23 ENCOUNTER — Telehealth: Payer: Self-pay

## 2016-07-23 NOTE — Telephone Encounter (Signed)
[employer] sent a PA request for Dulera 276mcg PA initiated through [email or web address] Key: [id number] This has been sent to plan, determination expected within 72 hours. Will forward to myself to follow up on.

## 2016-07-27 ENCOUNTER — Telehealth (HOSPITAL_COMMUNITY): Payer: Self-pay | Admitting: *Deleted

## 2016-07-27 NOTE — Telephone Encounter (Signed)
Patient given detailed instructions per Myocardial Perfusion Study Information Sheet for the test on 07/29/16. Patient notified to arrive 15 minutes early and that it is imperative to arrive on time for appointment to keep from having the test rescheduled.  If you need to cancel or reschedule your appointment, please call the office within 24 hours of your appointment. Failure to do so may result in a cancellation of your appointment, and a $50 no show fee. Patient verbalized understanding. [name]    

## 2016-07-27 NOTE — Telephone Encounter (Signed)
Error

## 2016-07-28 NOTE — Telephone Encounter (Signed)
PA for dulera has been approved through 05/23/17. Both pt and [facility] are aware of approval. Nothing further needed.

## 2016-07-29 ENCOUNTER — Ambulatory Visit (HOSPITAL_COMMUNITY): Payer: Medicare Other | Attending: Internal Medicine

## 2016-07-29 DIAGNOSIS — R0602 Shortness of breath: Secondary | ICD-10-CM | POA: Insufficient documentation

## 2016-07-29 DIAGNOSIS — R0789 Other chest pain: Secondary | ICD-10-CM

## 2016-07-29 DIAGNOSIS — R079 Chest pain, unspecified: Secondary | ICD-10-CM | POA: Insufficient documentation

## 2016-07-29 DIAGNOSIS — R0609 Other forms of dyspnea: Secondary | ICD-10-CM | POA: Diagnosis not present

## 2016-07-29 DIAGNOSIS — I2510 Atherosclerotic heart disease of native coronary artery without angina pectoris: Secondary | ICD-10-CM | POA: Insufficient documentation

## 2016-07-29 LAB — MYOCARDIAL PERFUSION IMAGING
CHL CUP RESTING HR STRESS: 84 {beats}/min
LV sys vol: 7 mL
LVDIAVOL: 37 mL (ref 46–106)
NUC STRESS TID: 1.07
Peak HR: 93 {beats}/min
RATE: 0.34
SDS: 2
SRS: 0
SSS: 2

## 2016-07-29 MED ORDER — TECHNETIUM TC 99M TETROFOSMIN IV KIT
10.2000 | PACK | Freq: Once | INTRAVENOUS | Status: AC | PRN
  Administered 2016-07-29: 10.2 via INTRAVENOUS
  Filled 2016-07-29: qty 11

## 2016-07-29 MED ORDER — TECHNETIUM TC 99M TETROFOSMIN IV KIT
32.9000 | PACK | Freq: Once | INTRAVENOUS | Status: AC | PRN
  Administered 2016-07-29: 32.9 via INTRAVENOUS
  Filled 2016-07-29: qty 33

## 2016-07-29 MED ORDER — REGADENOSON 0.4 MG/5ML IV SOLN
0.4000 mg | Freq: Once | INTRAVENOUS | Status: AC
  Administered 2016-07-29: 0.4 mg via INTRAVENOUS

## 2016-08-04 ENCOUNTER — Ambulatory Visit (INDEPENDENT_AMBULATORY_CARE_PROVIDER_SITE_OTHER): Payer: Medicare Other | Admitting: Pulmonary Disease

## 2016-08-04 ENCOUNTER — Encounter: Payer: Self-pay | Admitting: Pulmonary Disease

## 2016-08-04 ENCOUNTER — Ambulatory Visit: Payer: Medicare Other | Admitting: Pulmonary Disease

## 2016-08-04 VITALS — BP 124/66 | HR 101 | Ht 64.0 in | Wt 89.8 lb

## 2016-08-04 DIAGNOSIS — J479 Bronchiectasis, uncomplicated: Secondary | ICD-10-CM | POA: Diagnosis not present

## 2016-08-04 DIAGNOSIS — R0600 Dyspnea, unspecified: Secondary | ICD-10-CM | POA: Diagnosis not present

## 2016-08-04 MED ORDER — SODIUM CHLORIDE 3 % IN NEBU: INHALATION_SOLUTION | Freq: Two times a day (BID) | RESPIRATORY_TRACT | 11 refills | Status: AC

## 2016-08-04 MED ORDER — ALBUTEROL SULFATE (2.5 MG/3ML) 0.083% IN NEBU: 2.5000 mg | INHALATION_SOLUTION | Freq: Four times a day (QID) | RESPIRATORY_TRACT | 11 refills | Status: DC | PRN

## 2016-08-04 NOTE — Progress Notes (Signed)
Subjective:    Patient ID: Destiny Rodriguez, female    DOB: 07/03/1924, 83 y.o.   MRN: [id number]  Synopsis: Former Patient of Dr. Gwenette Greet with COPD and MAC; She does better with longer courses of antibiotics (14 days) when she has flare ups. Rx with clarithro/ethamb 2000-2001 Started clarithro/ethamb/rif 08/2010  >  finished 58mo of treatment ending May 2014.  She experienced severe muscle cramps due to antibiotics.    Uses therapy vest twice a day.  Spirometry 2010: FEV1 1.26 (71%), ratio 65. Spirometry 2018 ratio 59%, FEV1 0.79 L 49% predicted Good response to LABA/ICS  AUGUST 2016 Sputum> pseudomonas, cipro sensitive August 2016 Sputum > MAC positive x2  Had pneumonia in March 2017. Refer to pulmonary rehabilitation in April 2017.  HPI Chief Complaint  Patient presents with  . Follow-up    rov at BQ request to review records.  pt states that her symptoms are unchanged since last office visit.     Some days the cough is bad, but [facility]  She is not having the mouth sores anymore.  She has a coughing spell every day at noon and this has not been as bad.  She still has significant shortness of breath which has not improved.  She still has significant dyspnea since the last visit.  She has not experienced weight loss, fevers, chills or night sweats.  She continues to use the vest twice a day.    Past Medical History:  Diagnosis Date  . Allergic rhinitis   . Bronchiectasis   . Chronic sinusitis   . Hyperlipidemia   . MAC (mycobacterium avium-intracellulare complex)   . Migraine   . Rheumatism       Review of Systems  Constitutional: Negative for chills, fatigue and fever.  HENT: Negative for postnasal drip, rhinorrhea and sinus pressure.   Respiratory: Negative for cough, shortness of breath and wheezing.   Cardiovascular: Negative for chest pain, palpitations and leg swelling.       Objective:   Physical Exam Vitals:   08/04/16 1555    BP: 124/66  Pulse: (!) 101  SpO2: 93%  Weight: 89 lb 12.8 oz (40.7 kg)  Height: _0  (1.626 m)  RA    Gen: well appearing HENT: OP clear, TM's clear, neck supple PULM: Crackles bases, normal air movement B, normal percussion CV: RRR, no mgr, trace edema GI: BS+, soft, nontender Derm: no cyanosis or rash Psyche: normal mood and affect   Records from her stress test reviewed which showed no evidence of coronary disease. Microbiology records reviewed again today no evidence of mycobacterial growth since 2016.     Imaging: January 2018 chest x-ray images independently reviewed showing what appears to be emphysematous changes with chronic cavity lesions in the upper lobes. CT chest 04/2011: large cavitary disease superimposed over bronchiectasis.  Started on MAC meds per ID 05/2011 CT chest 12/20/12 >Bronchiectasis, pulmonary parenchymal cysts,   A collapsed cyst in the left upper lobe with associated presumed pulmonary parenchymal scarring   TSH from January 2018 within normal limits.  06/2016 Upper GI barium swallow > small amount of gastroesophageal reflux and small hiatal hernia  Echo: 03/2016 LVEF 55-60%, LVH mild, valves OK, no RV abnormality   Assessment & Plan:  Impression: Bronchiectasis Dyspnea History of mycobacterial disease  Discussion: Ms. MLemieuxhas reached the point where her chronic lung disease is causing fairly significant dyspnea. We have performed a thorough evaluation and I cannot find  evidence of a concomitant condition which would explain this such as heart disease or hematologic disease or neurologic illness. I explained to she and her husband today that I do not believe she has recurrent mycobacterial disease as her previous presentations are characterized by recurrent bouts of bronchitis and pneumonia and she has had none of that recently.  We are going to add nebulized therapies (albuterol and hypertonic saline) to try to help her with mucociliary  clearance to see if this helps her therapy. However, I explained to her and her husband in no uncertain terms today that this dyspnea may continue to progress and she may need to consider hospice at some point in the next several months. I hope not.  At this point she does not need that.  For your mucociliary clearance: Use the therapy vest Then use the albuterol nebulizer Then used the hypertonic saline nebulizer Follow this cycle twice a day  For your shortness of breath: Try to stay active, but recognize that you are going to have shortness of breath for the rest of your life Let me know if it's worsening  For the bronchiectasis: Continue using Dulera 2 puffs twice a day  We will see you back in 2 months or sooner if needed   Current Outpatient Prescriptions:  .  albuterol (PROVENTIL) (2.5 MG/3ML) 0.083% nebulizer solution, Take 3 mLs (2.5 mg total) by nebulization every 6 (six) hours as needed for wheezing or shortness of breath., Disp: 360 mL, Rfl: 11 .  Biotin 1000 MCG tablet, Take 1,000 mcg by mouth daily., Disp: , Rfl:  .  calcium carbonate (OS-CAL) 600 MG TABS, Take 600 mg by mouth 2 (two) times daily with a meal. , Disp: , Rfl:  .  chlorpheniramine (CHLOR-TRIMETON) 4 MG tablet, Take 4 mg by mouth at bedtime., Disp: , Rfl:  .  guaiFENesin (MUCINEX) 600 MG 12 hr tablet, Take 2 tablets (1,200 mg total) by mouth 2 (two) times daily. (Patient taking differently: Take 1,200 mg by mouth 2 (two) times daily as needed. ), Disp: , Rfl:  .  HYDROcodone-homatropine (HYCODAN) 5-1.5 MG/5ML syrup, Take 5 mLs by mouth at bedtime as needed for cough., Disp: 180 mL, Rfl: 0 .  Liniments (SALONPAS PAIN RELIEF PATCH EX), Apply topically as needed. Reported on 12/05/2015, Disp: , Rfl:  .  mometasone-formoterol (DULERA) 200-5 MCG/ACT AERO, Inhale 2 puffs into the lungs 2 (two) times daily., Disp: 1 Inhaler, Rfl: 11 .  MULTIPLE VITAMIN PO, Take 1 tablet by mouth daily. , Disp: , Rfl:  .  mupirocin cream  (BACTROBAN) 2 %, Apply 1 application topically 2 (two) times daily. To affected area as needed, Disp: 15 g, Rfl: 0 .  OXYGEN, Inhale into the lungs at bedtime., Disp: , Rfl:  .  polyethylene glycol (MIRALAX / GLYCOLAX) packet, Take 8.5 g by mouth every other day., Disp: , Rfl:  .  PROAIR HFA 108 (90 Base) MCG/ACT inhaler, USE 2 PUFFS EVERY 4 HOURS AS NEEDED FOR WHEEZE OR SHORTNESS OF BREATHOR BAD COUGH., Disp: 8.5 g, Rfl: 0 .  ranitidine (ZANTAC) 150 MG tablet, Take 1 tablet (150 mg total) by mouth daily after supper., Disp: 30 tablet, Rfl: 1

## 2016-08-04 NOTE — Patient Instructions (Signed)
For your mucociliary clearance: Use the therapy vest Then use the albuterol nebulizer Then used the hypertonic saline nebulizer Follow this cycle twice a day  For your shortness of breath: Try to stay active, but recognize that you are going to have shortness of breath for the rest of your life Let me know if it's worsening  For the bronchiectasis: Continue using Dulera 2 puffs twice a day  We will see you back in 2 months or sooner if needed

## 2016-08-17 ENCOUNTER — Other Ambulatory Visit: Payer: Self-pay

## 2016-08-17 DIAGNOSIS — J9611 Chronic respiratory failure with hypoxia: Secondary | ICD-10-CM

## 2016-08-17 DIAGNOSIS — J479 Bronchiectasis, uncomplicated: Secondary | ICD-10-CM

## 2016-08-17 DIAGNOSIS — J449 Chronic obstructive pulmonary disease, unspecified: Secondary | ICD-10-CM

## 2016-08-26 ENCOUNTER — Ambulatory Visit (INDEPENDENT_AMBULATORY_CARE_PROVIDER_SITE_OTHER): Payer: Medicare Other | Admitting: Internal Medicine

## 2016-08-26 ENCOUNTER — Encounter: Payer: Self-pay | Admitting: Internal Medicine

## 2016-08-26 VITALS — BP 130/71 | HR 98 | Temp 98.1°F | Wt 87.0 lb

## 2016-08-26 DIAGNOSIS — A310 Pulmonary mycobacterial infection: Secondary | ICD-10-CM | POA: Diagnosis present

## 2016-08-26 DIAGNOSIS — J479 Bronchiectasis, uncomplicated: Secondary | ICD-10-CM | POA: Diagnosis not present

## 2016-08-26 DIAGNOSIS — E43 Unspecified severe protein-calorie malnutrition: Secondary | ICD-10-CM

## 2016-08-29 NOTE — Progress Notes (Signed)
RFV: follow up for pulmonary MAC  Patient ID: Destiny Rodriguez, female   DOB: 1925-02-28, 83 y.o.   MRN: [id number]  HPI Destiny Rodriguez is a 83yo F who has hx of COPD, bronchiectasis, pulmonary MAC where she was last treated in May 2014, but followed for exacerbations. She uses therapeutic vests once-twice a day, and inhalers but still she has significant shortness of breath with exertion. She saw dr [place] Bells in late February, where her lung function tests showed severe airflow obstruction and he placed her on continuous 2L  supplementation. She realizes that this is progressive decline in the last year. In addition, she has had some weight loss, which makes me concern for active disease of pulmonary MAC. She is reluctant in restarting medication.  Outpatient Encounter Prescriptions as of 08/26/2016  Medication Sig  . albuterol (PROVENTIL) (2.5 MG/3ML) 0.083% nebulizer solution Take 3 mLs (2.5 mg total) by nebulization every 6 (six) hours as needed for wheezing or shortness of breath.  . Biotin 1000 MCG tablet Take 1,000 mcg by mouth daily.  . calcium carbonate (OS-CAL) 600 MG TABS Take 600 mg by mouth 2 (two) times daily with a meal.   . chlorpheniramine (CHLOR-TRIMETON) 4 MG tablet Take 4 mg by mouth at bedtime.  . ENSURE (ENSURE) Take 237 mLs by mouth daily.  Marland Kitchen HYDROcodone-homatropine (HYCODAN) 5-1.5 MG/5ML syrup Take 5 mLs by mouth at bedtime as needed for cough.  . mometasone-formoterol (DULERA) 200-5 MCG/ACT AERO Inhale 2 puffs into the lungs 2 (two) times daily.  . MULTIPLE VITAMIN PO Take 1 tablet by mouth daily.   . mupirocin cream (BACTROBAN) 2 % Apply 1 application topically 2 (two) times daily. To affected area as needed  . OXYGEN Inhale into the lungs at bedtime.  . polyethylene glycol (MIRALAX / GLYCOLAX) packet Take 8.5 g by mouth every other day.  Marland Kitchen PROAIR HFA 108 (90 Base) MCG/ACT inhaler USE 2 PUFFS EVERY 4 HOURS AS NEEDED FOR WHEEZE OR SHORTNESS OF BREATHOR BAD COUGH.  .  ranitidine (ZANTAC) 150 MG tablet Take 1 tablet (150 mg total) by mouth daily after supper.  . sodium chloride HYPERTONIC 3 % nebulizer solution Take by nebulization 2 (two) times daily.  . [DISCONTINUED] Liniments (SALONPAS PAIN RELIEF PATCH EX) Apply topically as needed. Reported on 12/05/2015  . guaiFENesin (MUCINEX) 600 MG 12 hr tablet Take 2 tablets (1,200 mg total) by mouth 2 (two) times daily. (Patient not taking: Reported on 08/26/2016)   No facility-administered encounter medications on file as of 08/26/2016.      Patient Active Problem List   Diagnosis Date Noted  . Protein-calorie malnutrition ([place]) 06/16/2016  . Hyponatremia 03/26/2016  . Dyspnea 02/16/2016  . Chronic respiratory failure with hypoxia ([place]) 11/27/2015  . Hemoptysis 12/07/2013  . Pneumonia 12/06/2013  . Black stool 11/23/2010  . Mycobacterium avium-intracellulare complex ([place]) 06/07/2008  . SINUSITIS, CHRONIC 06/07/2008  . ALLERGIC RHINITIS 06/07/2008  . COPD (chronic obstructive pulmonary disease) with chronic bronchitis ([place]) 06/07/2008  . HYPERLIPIDEMIA 03/25/2007  . Bronchiectasis without acute exacerbation ([place]) 03/25/2007  . HIATAL HERNIA 03/25/2007     Health Maintenance Due  Topic Date Due  . DEXA SCAN  08/25/1991     Review of Systems +DOE, weight loss, 12 point ros. Physical Exam   BP 130/71   Pulse 98   Temp 98.1 F (36.7 C) (Oral)   Wt 87 lb (39.5 kg)   BMI 14.93 kg/m   (2 lb weight loss from end of February)  Physical  Exam  Constitutional:  oriented to person, place, and time. appears well-developed and well-nourished. No distress.  HENT: [place]/AT, PERRLA, no scleral icterus Mouth/Throat: Oropharynx is clear and moist. No oropharyngeal exudate. Fatigue appears, thin  fraime Cardiovascular: Normal rate, regular rhythm and normal heart sounds. Exam reveals no gallop and no friction rub.  No murmur heard.  Pulmonary/Chest: Effort normal and breath sounds normal. No respiratory distress.   has no wheezes.  Neck = supple, no nuchal rigidity Lymphadenopathy: no cervical adenopathy. No axillary adenopathy Neurological: alert and oriented to person, place, and time.  Skin: Skin is warm and dry. No rash noted. No erythema.  Psychiatric: a normal mood and affect.  behavior is normal.   CBC Lab Results  Component Value Date   WBC 14.8 (H) 03/26/2016   RBC 4.59 03/26/2016   HGB 14.1 03/26/2016   HCT 42.6 03/26/2016   PLT 197.0 03/26/2016   MCV 92.9 03/26/2016   MCH 30.7 12/07/2013   MCHC 33.2 03/26/2016   RDW 14.2 03/26/2016   LYMPHSABS 1.4 03/26/2016   MONOABS 2.0 (H) 03/26/2016   EOSABS 0.1 03/26/2016    BMET Lab Results  Component Value Date   NA 136 04/05/2016   K 4.7 04/05/2016   CL 97 04/05/2016   CO2 34 (H) 04/05/2016   GLUCOSE 99 04/05/2016   BUN 13 04/05/2016   CREATININE 0.56 04/05/2016   CALCIUM 9.5 04/05/2016   GFRNONAA 78 (L) 12/07/2013   GFRAA >90 12/07/2013      Assessment and Plan  Pulmonary mac = concern that despite not having significant cough, she is significantly impaired with SOB. We discussed empirically restarting pulmonary MAC treatment but she would like to wait and think about it  COPD, severe obstruction/bronchiectasis = to continue with inhalers, vest, and supplementaly oxygen  Severe protein-calorie malnutrition = recommend to attempt to improve protein intake as much as possible

## 2016-09-01 ENCOUNTER — Ambulatory Visit: Payer: Medicare Other | Admitting: Pulmonary Disease

## 2016-09-22 DIAGNOSIS — R531 Weakness: Secondary | ICD-10-CM | POA: Diagnosis not present

## 2016-09-29 DIAGNOSIS — A310 Pulmonary mycobacterial infection: Secondary | ICD-10-CM | POA: Diagnosis not present

## 2016-09-29 DIAGNOSIS — E440 Moderate protein-calorie malnutrition: Secondary | ICD-10-CM | POA: Diagnosis not present

## 2016-09-29 DIAGNOSIS — J9611 Chronic respiratory failure with hypoxia: Secondary | ICD-10-CM | POA: Diagnosis not present

## 2016-10-06 DIAGNOSIS — R531 Weakness: Secondary | ICD-10-CM | POA: Diagnosis not present

## 2016-10-11 ENCOUNTER — Ambulatory Visit: Payer: Medicare Other | Admitting: Pulmonary Disease

## 2016-10-12 ENCOUNTER — Ambulatory Visit (INDEPENDENT_AMBULATORY_CARE_PROVIDER_SITE_OTHER): Payer: Medicare Other | Admitting: Pulmonary Disease

## 2016-10-12 ENCOUNTER — Encounter: Payer: Self-pay | Admitting: Pulmonary Disease

## 2016-10-12 DIAGNOSIS — J479 Bronchiectasis, uncomplicated: Secondary | ICD-10-CM | POA: Diagnosis not present

## 2016-10-12 DIAGNOSIS — J449 Chronic obstructive pulmonary disease, unspecified: Secondary | ICD-10-CM | POA: Diagnosis not present

## 2016-10-12 DIAGNOSIS — J9611 Chronic respiratory failure with hypoxia: Secondary | ICD-10-CM | POA: Diagnosis not present

## 2016-10-12 NOTE — Assessment & Plan Note (Signed)
She will continue wearing O2 at night (2Lpm) and 3L with exertion.

## 2016-10-12 NOTE — Progress Notes (Signed)
Subjective:    Patient ID: Destiny Rodriguez, female    DOB: Sep 22, 1924, 83 y.o.   MRN: [id number]  Synopsis: Former Patient of Dr. Gwenette Greet with COPD and MAC; She does better with longer courses of antibiotics (14 days) when she has flare ups. Rx with clarithro/ethamb 2000-2001 Started clarithro/ethamb/rif 08/2010  >  finished 62mo of treatment ending May 2014.  She experienced severe muscle cramps due to antibiotics.    Uses therapy vest twice a day.  Spirometry 2010: FEV1 1.26 (71%), ratio 65. Spirometry 2018 ratio 59%, FEV1 0.79 L 49% predicted Good response to LABA/ICS  AUGUST 2016 Sputum> pseudomonas, cipro sensitive August 2016 Sputum > MAC positive x2  Had pneumonia in March 2017. Refer to pulmonary rehabilitation in April 2017.  HPI Chief Complaint  Patient presents with  . Follow-up    on 3 L today, more SOB, wondering if she needs more oxygen with exertion, some cough not more than usual     Destiny Rodriguez struggled with dyspnea lately.  She notes that her breathing is worse but she has a hard time quantifying it for me.  She says that she can't really do much without getting dyspnea.  She says that she says it feels like having a chain around her foot that she forgets about but will notice it with activity.  She says that she is not as active as she would like.  She is able to make small meals occasionally, but even that level of activity is tiring.  Her husband is helping a lot. He is helping with activities around the house. She can still put on clothes by herself, shower independently.  The friends home recently sent an occupational therapist who gave her a shower chair and other devices which have made her life easier.    She desires to remain independent as much and as long as possible.    [facility] we [facility] with.  She says that even the portable oxygen tank is too heavy for her.  She is trying to get a  portable oxygen concentrator right now. She is wearing the oxygen nearly 24 hours a day.  She says that she takes oxygen off when she is sitting watching television at night. Other than that she uses her oxygen continuously.  She wears it when she sleeps (2Lpm).  The oxygen helps her breathe better.  When she first started taking hypertonic saline she produced a lot of mucus but the amount produced has dropped recently.    Past Medical History:  Diagnosis Date  . Allergic rhinitis   . Bronchiectasis   . Chronic sinusitis   . Hyperlipidemia   . MAC (mycobacterium avium-intracellulare complex)   . Migraine   . Rheumatism       Review of Systems  Constitutional: Negative for chills, fatigue and fever.  HENT: Negative for postnasal drip, rhinorrhea and sinus pressure.   Respiratory: Negative for cough, shortness of breath and wheezing.   Cardiovascular: Negative for chest pain, palpitations and leg swelling.       Objective:   Physical Exam Vitals:   10/12/16 1454  BP: 138/84  Pulse: 95  SpO2: 91%  Weight: 86 lb 3.2 oz (39.1 kg)  Height: _0  (1.626 m)  2Lpm pulse O2  Ambulated 150 feet, O2 saturation dropped to 93% on 3Lpm pulse   Gen: Frail, elderly, chronically ill appearing HENT: OP clear, TM's clear, neck supple PULM:  Crackles and wheezes bilaterally B, normal percussion CV: RRR, systolic murmur noted, trace edema GI: BS+, soft, nontender Derm: no cyanosis or rash Psyche: normal mood and affect   Records from her stress test reviewed which showed no evidence of coronary disease. Microbiology records reviewed again today no evidence of mycobacterial growth since 2016.     Imaging: January 2018 chest x-ray images independently reviewed showing what appears to be emphysematous changes with chronic cavity lesions in the upper lobes. CT chest 04/2011: large cavitary disease superimposed over bronchiectasis.  Started on MAC meds per ID 05/2011 CT chest 12/20/12  >Bronchiectasis, pulmonary parenchymal cysts,   A collapsed cyst in the left upper lobe with associated presumed pulmonary parenchymal scarring   TSH from January 2018 within normal limits.  06/2016 Upper GI barium swallow > small amount of gastroesophageal reflux and small hiatal hernia  Echo: 03/2016 LVEF 55-60%, LVH mild, valves OK, no RV abnormality   Assessment & Plan:  Chronic respiratory failure with hypoxia (H[facility]) She will continue wearing O2 at night (2Lpm) and 3L with exertion.    Bronchiectasis without acute exacerbation ([place]) As stated previously she has severe end stage bronchiectasis despite aggressive measures with a therapy vest and hypertonic saline.  She feels that the nebulizer treatment (hypertonic saline) is "tiring" from all the coughing she has now.  In general she thinks that the hypertonic saline is helpful.  Plan: Continue therapy vest twice a day Continue hypertonic saline at least once per day preceded by albuterol Continue Dulera  COPD (chronic obstructive pulmonary disease) with chronic bronchitis As per bronchiectasis  > 50% of this 28 minute visit spent face to face  Current Outpatient Prescriptions:  .  albuterol (PROVENTIL) (2.5 MG/3ML) 0.083% nebulizer solution, Take 3 mLs (2.5 mg total) by nebulization every 6 (six) hours as needed for wheezing or shortness of breath., Disp: 360 mL, Rfl: 11 .  Biotin 1000 MCG tablet, Take 1,000 mcg by mouth daily., Disp: , Rfl:  .  calcium carbonate (OS-CAL) 600 MG TABS, Take 600 mg by mouth 2 (two) times daily with a meal. , Disp: , Rfl:  .  chlorpheniramine (CHLOR-TRIMETON) 4 MG tablet, Take 4 mg by mouth at bedtime., Disp: , Rfl:  .  ENSURE (ENSURE), Take 237 mLs by mouth daily., Disp: , Rfl:  .  guaiFENesin (MUCINEX) 600 MG 12 hr tablet, Take 2 tablets (1,200 mg total) by mouth 2 (two) times daily., Disp: , Rfl:  .  HYDROcodone-homatropine (HYCODAN) 5-1.5 MG/5ML syrup, Take 5 mLs by mouth at bedtime as needed  for cough., Disp: 180 mL, Rfl: 0 .  mometasone-formoterol (DULERA) 200-5 MCG/ACT AERO, Inhale 2 puffs into the lungs 2 (two) times daily., Disp: 1 Inhaler, Rfl: 11 .  MULTIPLE VITAMIN PO, Take 1 tablet by mouth daily. , Disp: , Rfl:  .  mupirocin cream (BACTROBAN) 2 %, Apply 1 application topically 2 (two) times daily. To affected area as needed, Disp: 15 g, Rfl: 0 .  OXYGEN, Inhale into the lungs at bedtime., Disp: , Rfl:  .  polyethylene glycol (MIRALAX / GLYCOLAX) packet, Take 8.5 g by mouth every other day., Disp: , Rfl:  .  PROAIR HFA 108 (90 Base) MCG/ACT inhaler, USE 2 PUFFS EVERY 4 HOURS AS NEEDED FOR WHEEZE OR SHORTNESS OF BREATHOR BAD COUGH., Disp: 8.5 g, Rfl: 0 .  ranitidine (ZANTAC) 150 MG tablet, Take 1 tablet (150 mg total) by mouth daily after supper., Disp: 30 tablet, Rfl: 1 .  sodium chloride HYPERTONIC  3 % nebulizer solution, Take by nebulization 2 (two) times daily., Disp: 750 mL, Rfl: 11

## 2016-10-12 NOTE — Assessment & Plan Note (Signed)
As per bronchiectasis

## 2016-10-12 NOTE — Patient Instructions (Addendum)
Keep using the therapy vest and hypertonic saline and albuterol as you are doing Keep using the oxygen as you are doing Keep taking your [facility] as you are doing Let me know if your symptoms are worsening

## 2016-10-12 NOTE — Assessment & Plan Note (Signed)
As stated previously she has severe end stage bronchiectasis despite aggressive measures with a therapy vest and hypertonic saline.  She feels that the nebulizer treatment (hypertonic saline) is "tiring" from all the coughing she has now.  In general she thinks that the hypertonic saline is helpful.  Plan: Continue therapy vest twice a day Continue hypertonic saline at least once per day preceded by albuterol Continue [facility]

## 2016-10-25 ENCOUNTER — Ambulatory Visit (INDEPENDENT_AMBULATORY_CARE_PROVIDER_SITE_OTHER)
Admission: RE | Admit: 2016-10-25 | Discharge: 2016-10-25 | Disposition: A | Discharge status: Home / Self Care | Payer: Medicare Other | Source: Ambulatory Visit | Attending: Acute Care | Admitting: Acute Care

## 2016-10-25 ENCOUNTER — Ambulatory Visit (INDEPENDENT_AMBULATORY_CARE_PROVIDER_SITE_OTHER): Payer: Medicare Other | Admitting: Acute Care

## 2016-10-25 ENCOUNTER — Other Ambulatory Visit (INDEPENDENT_AMBULATORY_CARE_PROVIDER_SITE_OTHER): Payer: Medicare Other

## 2016-10-25 ENCOUNTER — Encounter: Payer: Self-pay | Admitting: Acute Care

## 2016-10-25 DIAGNOSIS — R0602 Shortness of breath: Secondary | ICD-10-CM | POA: Diagnosis not present

## 2016-10-25 DIAGNOSIS — R0600 Dyspnea, unspecified: Secondary | ICD-10-CM

## 2016-10-25 DIAGNOSIS — J471 Bronchiectasis with (acute) exacerbation: Secondary | ICD-10-CM

## 2016-10-25 DIAGNOSIS — R05 Cough: Secondary | ICD-10-CM | POA: Diagnosis not present

## 2016-10-25 DIAGNOSIS — R059 Cough, unspecified: Secondary | ICD-10-CM

## 2016-10-25 DIAGNOSIS — J9611 Chronic respiratory failure with hypoxia: Secondary | ICD-10-CM | POA: Diagnosis not present

## 2016-10-25 LAB — BASIC METABOLIC PANEL
BUN: 16 mg/dL (ref 6–23)
CALCIUM: 10.2 mg/dL (ref 8.4–10.5)
CO2: 33 mEq/L — ABNORMAL HIGH (ref 19–32)
CREATININE: 0.58 mg/dL (ref 0.40–1.20)
Chloride: 95 mEq/L — ABNORMAL LOW (ref 96–112)
GFR: 103.76 mL/min (ref >60.00)
Glucose, Bld: 107 mg/dL — ABNORMAL HIGH (ref 70–99)
Potassium: 4.3 mEq/L (ref 3.5–5.1)
Sodium: 135 mEq/L (ref 135–145)

## 2016-10-25 MED ORDER — CIPROFLOXACIN HCL 750 MG PO TABS: 750.0000 mg | ORAL_TABLET | Freq: Two times a day (BID) | ORAL | 0 refills | Status: AC

## 2016-10-25 NOTE — Patient Instructions (Addendum)
It is good to see you today We will do a CXR today.  We will check a BMET today. We will call you with results. Please start Cipro 750 mg twice daily for 7 days.( Per up to date for pseudamonas) Please take probiotic with antibiotic Sputum culture, fungal, AFB Follow up with Eric Form, NP in 1 week. We will determine if an additional week of antibiotic therapy is indicated. If you are not improving by Wednesday, please call for a Friday appointment. Please contact office for sooner follow up if symptoms do not improve or worsen or seek emergency care  Continue your Chest vest , albuterol and hypertonic saline as you have been doing. Flutter valve as able Please continue to work on weight gain, with meal supplements. Please contact office for sooner follow up if symptoms do not improve or worsen or seek emergency care

## 2016-10-25 NOTE — Assessment & Plan Note (Addendum)
Flare Plan: We will do a CXR today. We will check a BMET today. We will call you with results. Continue Dulera Rinse mouth after use Please start Cipro 750 mg twice daily for 7 days.( Per up to date for pseudamonas) Please take probiotic with antibiotic Sputum culture, fungal, AFB Follow up with Eric Form, NP in 1 week. We will determine if an additional week of antibiotic therapy is indicated. If you are not improving by Wednesday, please call for a Friday appointment. Please contact office for sooner follow up if symptoms do not improve or worsen or seek emergency care  Continue your Chest vest , albuterol and hypertonic saline as you have been doing. Flutter valve as able Please continue to work on weight gain, with meal supplements. Please contact office for sooner follow up if symptoms do not improve or worsen or seek emergency care

## 2016-10-25 NOTE — Progress Notes (Signed)
History of Present Illness Destiny Rodriguez is a 83 y.o. female never smoker with COPD and MAC. She is oxygen dependent, and is followed by Dr. Lake Bells.  Synopsis: Former Patient of Dr. Gwenette Greet with COPD and MAC; She does better with longer courses of antibiotics (14 days) when she has flare ups. Rx with clarithro/ethamb 2000-2001 Started clarithro/ethamb/rif 08/2010  >  finished 49mo of treatment ending May 2014.  She experienced severe muscle cramps due to antibiotics.    Uses therapy vest twice a day. Uses hypertonic saline and albuterol twice daily.  Spirometry 2010: FEV1 1.26 (71%), ratio 65. Spirometry 2018 ratio 59%, FEV1 0.79 L 49% predicted Good response to LABA/ICS  AUGUST 2016 Sputum> pseudomonas, cipro sensitive August 2016 Sputum > MAC positive x2  Had pneumonia in March 2017. Refer to pulmonary rehabilitation in April 2017.  Acute OV 10/25/2016 Patient presents with a 2 week history of worsening cough productive for pinkish to greenish secretions. They are thin secretions.She denies a fever. She states she has had some chest pain with cough only. She is compliant with her vest twice daily, and her albuterol and hypertonic saline twice a day.She has lost an additional 2.5 pounds in 2 weeks. She states she has very little appetite and has to force herself to eat.She is very frail and feels her shortness of breath is a bit worse today. Saturations are 94% on 3-4 L. Chest x-ray today revealed hyperinflated lungs with upper lobe thinwall cavitary lesions stable when compared to previous chest x-ray. I'm also concerned regarding the significant weight loss that has continued. Weight today is 83 pounds. Patient denies fever, chest pain, orthopnea, or hemoptysis. She is scheduled to see ID tomorrow, but feels she is not well enough to go. I have asked her to reschedule the appointment within the next 2 weeks if possible, but I would like for her to go.    Test  Results: IMPRESSION: 1. No significant change. 2. Hyperinflated lungs with upper lobe thin wall cavitary lesions.  GFR 108 in 11.2017 GFR 10/25/2016>> 103.76  CBC Latest Ref Rng & Units 03/26/2016 02/16/2016 12/07/2013  WBC 4.0 - 10.5 K/uL 14.8(H) 9.2 12.5(H)  Hemoglobin 12.0 - 15.0 g/dL 14.1 14.9 13.4  Hematocrit 36.0 - 46.0 % 42.6 45.5 40.8  Platelets 150.0 - 400.0 K/uL 197.0 290.0 272    BMP Latest Ref Rng & Units 10/25/2016 04/05/2016 03/26/2016  Glucose 70 - 99 mg/dL 107(H) 99 132(H)  BUN 6 - 23 mg/dL 16 13 13   Creatinine 0.40 - 1.20 mg/dL 0.58 0.56 0.63  Sodium 135 - 145 mEq/L 135 136 132(L)  Potassium 3.5 - 5.1 mEq/L 4.3 4.7 4.3  Chloride 96 - 112 mEq/L 95(L) 97 97  CO2 19 - 32 mEq/L 33(H) 34(H) 30  Calcium 8.4 - 10.5 mg/dL 10.2 9.5 8.9    Past medical hx Past Medical History:  Diagnosis Date  . Allergic rhinitis   . Bronchiectasis   . Chronic sinusitis   . Hyperlipidemia   . MAC (mycobacterium avium-intracellulare complex)   . Migraine   . Rheumatism      Social History  Substance Use Topics  . Smoking status: Never Smoker  . Smokeless tobacco: Never Used  . Alcohol use No    Tobacco Cessation: Patient is a never smoker.  Past surgical hx, Family hx, Social hx all reviewed.  Current Outpatient Prescriptions on File Prior to Visit  Medication Sig  . albuterol (PROVENTIL) (2.5 MG/3ML) 0.083% nebulizer solution Take 3 mLs (  2.5 mg total) by nebulization every 6 (six) hours as needed for wheezing or shortness of breath.  . Biotin 1000 MCG tablet Take 1,000 mcg by mouth daily.  . calcium carbonate (OS-CAL) 600 MG TABS Take 600 mg by mouth 2 (two) times daily with a meal.   . chlorpheniramine (CHLOR-TRIMETON) 4 MG tablet Take 4 mg by mouth at bedtime.  . ENSURE (ENSURE) Take 237 mLs by mouth daily.  Marland Kitchen guaiFENesin (MUCINEX) 600 MG 12 hr tablet Take 2 tablets (1,200 mg total) by mouth 2 (two) times daily.  Marland Kitchen HYDROcodone-homatropine (HYCODAN) 5-1.5 MG/5ML syrup Take 5  mLs by mouth at bedtime as needed for cough.  . mometasone-formoterol (DULERA) 200-5 MCG/ACT AERO Inhale 2 puffs into the lungs 2 (two) times daily.  . MULTIPLE VITAMIN PO Take 1 tablet by mouth daily.   . mupirocin cream (BACTROBAN) 2 % Apply 1 application topically 2 (two) times daily. To affected area as needed  . OXYGEN Inhale into the lungs at bedtime.  . polyethylene glycol (MIRALAX / GLYCOLAX) packet Take 8.5 g by mouth every other day.  Marland Kitchen PROAIR HFA 108 (90 Base) MCG/ACT inhaler USE 2 PUFFS EVERY 4 HOURS AS NEEDED FOR WHEEZE OR SHORTNESS OF BREATHOR BAD COUGH.  . ranitidine (ZANTAC) 150 MG tablet Take 1 tablet (150 mg total) by mouth daily after supper.  . sodium chloride HYPERTONIC 3 % nebulizer solution Take by nebulization 2 (two) times daily.   No current facility-administered medications on file prior to visit.      Allergies  Allergen Reactions  . Tequin Rash    Review Of Systems:  Constitutional:   +  weight loss, No night sweats,  Fevers, chills, +fatigue, or  lassitude.  HEENT:   No headaches,  Difficulty swallowing,  Tooth/dental problems, or  Sore throat,                No sneezing, itching, ear ache, nasal congestion, post nasal drip,   CV:  No chest pain,  Orthopnea, PND, swelling in lower extremities, anasarca, dizziness, palpitations, syncope.   GI  No heartburn, indigestion, abdominal pain, nausea, vomiting, diarrhea, change in bowel habits,+  loss of appetite, bloody stools.   Resp: + shortness of breath with exertion or at rest.  + excess mucus, + productive cough,  No non-productive cough,  No coughing up of blood.  + change in color of mucus.  No wheezing.  No chest wall deformity  Skin: no rash or lesions.  GU: no dysuria, change in color of urine, no urgency or frequency.  No flank pain, no hematuria   MS:  No joint pain or swelling.  No decreased range of motion.  No back pain.  Psych:  No change in mood or affect. No depression or anxiety.  No  memory loss.   Vital Signs BP 110/80 (BP Location: Left Arm, Patient Position: Sitting, Cuff Size: Normal)   Pulse (!) 114   Ht 5' 4"  (1.626 m)   Wt 83 lb 6.4 oz (37.8 kg)   SpO2 94%   BMI 14.32 kg/m   Body mass index is 14.32 kg/m.  Physical Exam:  General- No distress,  A&Ox3, clearly feels bad, very thin ENT: No sinus tenderness, TM clear, pale nasal mucosa, no oral exudate,no post nasal drip, no LAN Cardiac: S1, S2, regular rate and rhythm, no murmur Chest: No wheeze/ rales/ dullness; no accessory muscle use, no nasal flaring, no sternal retractions, diminished per bases Abd.: Soft Non-tender, nondistended, bowel sounds  positive, Ext: No clubbing cyanosis, edema Neuro: Deconditioned and frail Skin: No rashes, warm and dry Psych: normal mood and behavior, appropriately concerned   Assessment/Plan  Chronic respiratory failure with hypoxia (HCC) Continue wearing oxygen at 2 L nasal cannula at rest, and 3 L nasal cannula with exertion.  Bronchiectasis with (acute) exacerbation ([place]) Flare Plan: We will do a CXR today. We will check a BMET today. We will call you with results. Continue Dulera Rinse mouth after use Please start Cipro 750 mg twice daily for 7 days.( Per up to date for pseudamonas) Please take probiotic with antibiotic Sputum culture, fungal, AFB Follow up with Eric Form, NP in 1 week. We will determine if an additional week of antibiotic therapy is indicated. If you are not improving by Wednesday, please call for a Friday appointment. Please contact office for sooner follow up if symptoms do not improve or worsen or seek emergency care  Continue your Chest vest , albuterol and hypertonic saline as you have been doing. Flutter valve as able Please continue to work on weight gain, with meal supplements. Please contact office for sooner follow up if symptoms do not improve or worsen or seek emergency care      Magdalen Spatz, NP 10/25/2016  7:49  PM

## 2016-10-25 NOTE — Assessment & Plan Note (Signed)
Continue wearing oxygen at 2 L nasal cannula at rest, and 3 L nasal cannula with exertion.

## 2016-10-26 ENCOUNTER — Other Ambulatory Visit: Payer: Medicare Other

## 2016-10-26 ENCOUNTER — Telehealth: Payer: Self-pay | Admitting: *Deleted

## 2016-10-26 ENCOUNTER — Ambulatory Visit: Payer: Medicare Other | Admitting: Internal Medicine

## 2016-10-26 DIAGNOSIS — R059 Cough, unspecified: Secondary | ICD-10-CM

## 2016-10-26 DIAGNOSIS — R05 Cough: Secondary | ICD-10-CM

## 2016-10-26 NOTE — Progress Notes (Signed)
Reviewed, agree 

## 2016-10-27 NOTE — Progress Notes (Signed)
Spoke with patient and informed her of results and recommendations. She verbalized understanding and did not have any questions. Nothing further is needed.

## 2016-10-29 LAB — RESPIRATORY CULTURE OR RESPIRATORY AND SPUTUM CULTURE: Organism ID, Bacteria: NORMAL

## 2016-11-01 ENCOUNTER — Other Ambulatory Visit (INDEPENDENT_AMBULATORY_CARE_PROVIDER_SITE_OTHER): Payer: Medicare Other

## 2016-11-01 ENCOUNTER — Ambulatory Visit (INDEPENDENT_AMBULATORY_CARE_PROVIDER_SITE_OTHER): Payer: Medicare Other | Admitting: Acute Care

## 2016-11-01 ENCOUNTER — Encounter: Payer: Self-pay | Admitting: Acute Care

## 2016-11-01 VITALS — BP 110/72 | HR 107 | Ht 64.0 in | Wt 84.2 lb

## 2016-11-01 DIAGNOSIS — J471 Bronchiectasis with (acute) exacerbation: Secondary | ICD-10-CM | POA: Diagnosis not present

## 2016-11-01 DIAGNOSIS — J9611 Chronic respiratory failure with hypoxia: Secondary | ICD-10-CM | POA: Diagnosis not present

## 2016-11-01 DIAGNOSIS — E440 Moderate protein-calorie malnutrition: Secondary | ICD-10-CM

## 2016-11-01 LAB — CBC WITH DIFFERENTIAL/PLATELET
BASOS ABS: 0 10*3/uL (ref 0.0–0.1)
Basophils Relative: 0.2 % (ref 0.0–3.0)
EOS PCT: 0.1 % (ref 0.0–5.0)
Eosinophils Absolute: 0 10*3/uL (ref 0.0–0.7)
HCT: 40.8 % (ref 36.0–46.0)
Hemoglobin: 13.5 g/dL (ref 12.0–15.0)
LYMPHS PCT: 6 % — AB (ref 12.0–46.0)
Lymphs Abs: 0.6 10*3/uL — ABNORMAL LOW (ref 0.7–4.0)
MCHC: 33.1 g/dL (ref 30.0–36.0)
MCV: 92.8 fl (ref 78.0–100.0)
Monocytes Absolute: 1.2 10*3/uL — ABNORMAL HIGH (ref 0.1–1.0)
Monocytes Relative: 12.1 % — ABNORMAL HIGH (ref 3.0–12.0)
NEUTROS ABS: 8.4 10*3/uL — AB (ref 1.4–7.7)
Neutrophils Relative %: 81.6 % — ABNORMAL HIGH (ref 43.0–77.0)
PLATELETS: 268 10*3/uL (ref 150.0–400.0)
RBC: 4.4 Mil/uL (ref 3.87–5.11)
RDW: 14.5 % (ref 11.5–15.5)
WBC: 10.3 10*3/uL (ref 4.0–10.5)

## 2016-11-01 LAB — BASIC METABOLIC PANEL
BUN: 18 mg/dL (ref 6–23)
CHLORIDE: 96 meq/L (ref 96–112)
CO2: 31 meq/L (ref 19–32)
CREATININE: 0.63 mg/dL (ref 0.40–1.20)
Calcium: 9.3 mg/dL (ref 8.4–10.5)
GFR: 94.32 mL/min (ref >60.00)
Glucose, Bld: 117 mg/dL — ABNORMAL HIGH (ref 70–99)
Potassium: 4.5 mEq/L (ref 3.5–5.1)
Sodium: 133 mEq/L — ABNORMAL LOW (ref 135–145)

## 2016-11-01 NOTE — Assessment & Plan Note (Signed)
Continue wearing oxygen at 2 L nasal cannula at rest, and 3 L nasal cannula with exertion.

## 2016-11-01 NOTE — Progress Notes (Signed)
Reviewed, agree 

## 2016-11-01 NOTE — Assessment & Plan Note (Signed)
Treated x 1 week with Cipro with improvement in cough and sputum production. Continued weakness and fatigue Plan: We will check some labs again today to check your kidney function . Decrease pressure to 40 on  chest vest.to make use more tolerable. Continue using your flutter valve as you have been doing. Stop taking Cipro let's see if you feel better off antibiotics Continue your Dulera and saline nebs as you have been doing. Use Mucinex as needed for chest congestion. We will check labs today. ( CBC and BMET) Drink plenty of fluids Work on weight gain , add meal supplements as able. Call on Wednesday without fail , and let us know if your fatigue is any better off the antibiotics. Call sooner if you need [place] sooner. Flonase 1 spray up each nostril once daily after blowing nose. Claritin 10 mg once daily as you have been doing.  Continue nasal saline Please contact office for sooner follow up if symptoms do not improve or worsen or seek emergency care

## 2016-11-01 NOTE — Patient Instructions (Addendum)
It is nice to see you today. We will check some labs again today to check your kidney function . Decrease pressure to 40 on  chest vest.to make use more tolerable. Continue using your flutter valve as you have been doing. Stop taking Cipro Continue your Dulera and saline nebs as you have been doing. Use Mucinex as needed for chest congestion. We will check labs today. ( CBC and BMET) Drink plenty of fluids Work on weight gain , add meal supplements as able. Call on Wednesday without fail and let us know if your fatigue is any better off the antibiotics. Call sooner if you need [place] sooner. Flonase 1 spray up each nostril once daily after blowing nose. Claritin 10 mg once daily as you have been doing.  Continue nasal saline Follow up with Dr. Lake Bells 12/14/16 as is scheduled Please contact office for sooner follow up if symptoms do not improve or worsen or seek emergency care

## 2016-11-01 NOTE — Progress Notes (Signed)
History of Present Illness Destiny Rodriguez is a 83 y.o. female never smoker  with with COPD and MAC. She is oxygen dependent, and is followed by Dr. Lake Bells.  Synopsis: Former Patient of Dr. Gwenette Greet with COPD and MAC; She does better with longer courses of antibiotics (14 days) when she has flare ups. Rx with clarithro/ethamb 2000-2001 Started clarithro/ethamb/rif 08/2010 >finished 69mo of treatment ending May 2014. She experienced severe muscle cramps due to antibiotics.  Uses therapy vest twice a day. Uses hypertonic saline and albuterol twice daily.  Spirometry 2010: FEV1 1.26 (71%), ratio 65. Spirometry 2018 ratio 59%, FEV1 0.79 L 49% predicted Good response to LABA/ICS  AUGUST 2016 Sputum> pseudomonas, cipro sensitive August 2016 Sputum > MAC positive x2  Had pneumonia in March 2017. Refer to pulmonary rehabilitation in April 2017.     11/01/2016 Acute OV: Pt. Presents for follow up after flare  of  Bronchiectasis last week. We treated with Cipro and scheduled for a short term follow up to evaluate her condition.She states she has stopped coughing and that her sputum production has really cleared up. She is compliant with her chest vest, flutter valve, saline  neb treatments and Dulera. She states that she has however, become progressively weaker and more fatigued over the week . Her husband has come with her today, and is concerned about her fatigue. She thinks the Cipro has made her feel bad. She states she has not had fever, but  has been more tired than usual.She is weak and is here in a wheel chair today. She states she has not been able to eat well over this week.. Per her husband, she is not drinking much, and is so weak she has trouble walking any significant distance. She denies fever, chest pain, orthopnea or hemoptysis.I had Dr.Ypung staff this patient with me as I am concerned about her progressive  fatigue.  Test Results: Sputum Gram Stain:10/25/2016: Few  gram-positive cocci in clusters, few gram-positive cocci in pairs and chains, rare gram-positive rods, normal oropharyngeal flora Sputum Culture 10/25/2016:( Preliminary)  SPECIMEN SOURCE:  SPUTUM   SPECIMEN QUALITY: ADEQUATE   SMEAR:       Many (4 +) acid-fast bacilli seen using the            fluorochrome method.  Negative for fungus.  CBC Latest Ref Rng & Units 03/26/2016 02/16/2016 12/07/2013  WBC 4.0 - 10.5 K/uL 14.8(H) 9.2 12.5(H)  Hemoglobin 12.0 - 15.0 g/dL 14.1 14.9 13.4  Hematocrit 36.0 - 46.0 % 42.6 45.5 40.8  Platelets 150.0 - 400.0 K/uL 197.0 290.0 272    BMP Latest Ref Rng & Units 10/25/2016 04/05/2016 03/26/2016  Glucose 70 - 99 mg/dL 107(H) 99 132(H)  BUN 6 - 23 mg/dL _0 Creatinine 0.40 - 1.20 mg/dL 0.58 0.56 0.63  Sodium 135 - 145 mEq/L 135 136 132(L)  Potassium 3.5 - 5.1 mEq/L 4.3 4.7 4.3  Chloride 96 - 112 mEq/L 95(L) 97 97  CO2 19 - 32 mEq/L 33(H) 34(H) 30  Calcium 8.4 - 10.5 mg/dL 10.2 9.5 8.9    Dg Chest 2 View  Result Date: 10/25/2016 CLINICAL DATA:  Short of breath. Bronchiectasis. Mycobacterium infection EXAM: CHEST  2 VIEW COMPARISON:  Radiograph 06/17/2016 FINDINGS: Normal cardiac silhouette. Lungs are hyperinflated. Cavitary lesions in the upper lobes are not changed from prior. No acute airspace disease identified. No pneumothorax. IMPRESSION: 1. No significant change. 2. Hyperinflated lungs with upper lobe thin wall cavitary lesions. Electronically Signed  By: Suzy Bouchard M.D.   On: 10/25/2016 11:52     Past medical hx Past Medical History:  Diagnosis Date  . Allergic rhinitis   . Bronchiectasis   . Chronic sinusitis   . Hyperlipidemia   . MAC (mycobacterium avium-intracellulare complex)   . Migraine   . Rheumatism      Social History  Substance Use Topics  . Smoking status: Never Smoker  . Smokeless tobacco: Never Used  . Alcohol use No    Tobacco Cessation: Patient is a never smoker  Past surgical hx,  Family hx, Social hx all reviewed.  Current Outpatient Prescriptions on File Prior to Visit  Medication Sig  . albuterol (PROVENTIL) (2.5 MG/3ML) 0.083% nebulizer solution Take 3 mLs (2.5 mg total) by nebulization every 6 (six) hours as needed for wheezing or shortness of breath.  . Biotin 1000 MCG tablet Take 1,000 mcg by mouth daily.  . calcium carbonate (OS-CAL) 600 MG TABS Take 600 mg by mouth 2 (two) times daily with a meal.   . chlorpheniramine (CHLOR-TRIMETON) 4 MG tablet Take 4 mg by mouth at bedtime.  . ciprofloxacin (CIPRO) 750 MG tablet Take 1 tablet (750 mg total) by mouth 2 (two) times daily.  Marland Kitchen ENSURE (ENSURE) Take 237 mLs by mouth daily.  Marland Kitchen guaiFENesin (MUCINEX) 600 MG 12 hr tablet Take 2 tablets (1,200 mg total) by mouth 2 (two) times daily.  Marland Kitchen HYDROcodone-homatropine (HYCODAN) 5-1.5 MG/5ML syrup Take 5 mLs by mouth at bedtime as needed for cough.  . mometasone-formoterol (DULERA) 200-5 MCG/ACT AERO Inhale 2 puffs into the lungs 2 (two) times daily.  . MULTIPLE VITAMIN PO Take 1 tablet by mouth daily.   . mupirocin cream (BACTROBAN) 2 % Apply 1 application topically 2 (two) times daily. To affected area as needed  . OXYGEN Inhale into the lungs at bedtime.  . polyethylene glycol (MIRALAX / GLYCOLAX) packet Take 8.5 g by mouth every other day.  Marland Kitchen PROAIR HFA 108 (90 Base) MCG/ACT inhaler USE 2 PUFFS EVERY 4 HOURS AS NEEDED FOR WHEEZE OR SHORTNESS OF BREATHOR BAD COUGH.  . ranitidine (ZANTAC) 150 MG tablet Take 1 tablet (150 mg total) by mouth daily after supper.  . sodium chloride HYPERTONIC 3 % nebulizer solution Take by nebulization 2 (two) times daily.   No current facility-administered medications on file prior to visit.      Allergies  Allergen Reactions  . Tequin Rash    Review Of Systems:  Constitutional:   No  weight loss, night sweats,  Fevers, chills,+ fatigue, or + lassitude.  HEENT:   No headaches,  Difficulty swallowing,  Tooth/dental problems, or  Sore  throat,                No sneezing, itching, ear ache, +nasal congestion, +post nasal drip,   CV:  No chest pain,  Orthopnea, PND, swelling in lower extremities, anasarca, dizziness, palpitations, syncope.   GI  No heartburn, indigestion, abdominal pain, nausea, vomiting, diarrhea, change in bowel habits, loss of appetite, bloody stools.   Resp: + shortness of breath with exertion less at rest.  Minimal excess mucus, rare productive cough,  No non-productive cough,  No coughing up of blood.  No change in color of mucus.  No wheezing.  No chest wall deformity  Skin: no rash or lesions  GU: no dysuria, change in color of urine, no urgency or frequency.  No flank pain, no hematuria   MS:  No joint pain or swelling.  No decreased range of motion.  No back pain.  Psych:  No change in mood or affect. No depression or anxiety.  No memory loss.   Vital Signs BP 110/72   Pulse (!) 107   Ht _0  (1.626 m)   Wt 84 lb 3.2 oz (38.2 kg)   SpO2 92%   BMI 14.45 kg/m    Physical Exam:  General- No distress,  A&Ox3, pleasant thin elderly female in a wheelchair ENT: No sinus tenderness, TM clear, pale nasal mucosa, no oral exudate,+ post nasal drip, no LAN Cardiac: S1, S2, regular rate and rhythm, no murmur Chest: No wheeze/ rales/ dullness; no accessory muscle use, no nasal flaring, no sternal retractions, diminished per bases Abd.: Soft Non-tender, non-distended flat, BS + Ext: No clubbing cyanosis, edema, feet are cool to touch  With 2+ pulses noted. Neuro: Alert and oriented 3, moving all extremities 4, deconditioning, frail and in a wheelchair today Skin: No rashes, warm and dry , intact but frail with bruising noted Psych: normal mood and behavior, appropriately concerned   Assessment/Plan  Bronchiectasis with (acute) exacerbation (HCC) Treated x 1 week with Cipro with improvement in cough and sputum production. Continued weakness and fatigue Plan: We will check some labs again  today to check your kidney function . Decrease pressure to 40 on  chest vest.to make use more tolerable. Continue using your flutter valve as you have been doing. Stop taking Cipro let's see if you feel better off antibiotics Continue your Dulera and saline nebs as you have been doing. Use Mucinex as needed for chest congestion. We will check labs today. ( CBC and BMET) Drink plenty of fluids Work on weight gain , add meal supplements as able. Call on Wednesday without fail , and let us know if your fatigue is any better off the antibiotics. Call sooner if you need [place] sooner. Flonase 1 spray up each nostril once daily after blowing nose. Claritin 10 mg once daily as you have been doing.  Continue nasal saline Please contact office for sooner follow up if symptoms do not improve or worsen or seek emergency care     Chronic respiratory failure with hypoxia ([place]) Continue wearing oxygen at 2 L nasal cannula at rest, and 3 L nasal cannula with exertion.  Protein-calorie malnutrition ([place]) Weight is stable after a week Plan Continue Ensure supplements 1-2 times daily. Continue small frequent meals to increase caloric intake Monitor weight is able     Magdalen Spatz, NP 11/01/2016  1:40 PM

## 2016-11-01 NOTE — Assessment & Plan Note (Signed)
Weight is stable after a week Plan Continue Ensure supplements 1-2 times daily. Continue small frequent meals to increase caloric intake Monitor weight is able

## 2016-11-03 ENCOUNTER — Telehealth: Payer: Self-pay | Admitting: Acute Care

## 2016-11-03 NOTE — Telephone Encounter (Signed)
Spoke with patient and patient's husband on the phone. They saw SG on Monday and were told to call back to report back if she wasn't feeling better. Pt's cough has returned, but is not productive. Denies any chest pains and fever. Increased SOB and fatigue. They are having to use the wheelchair to get her around the house.   They wish to use [facility].   SG, please advise. Thanks!

## 2016-11-03 NOTE — Telephone Encounter (Signed)
Spoke with pt's spouse, aware of SG's recs.  BQ please advise.  Thanks!

## 2016-11-03 NOTE — Telephone Encounter (Signed)
Patient's husband, Wille Glaser, asking if someone can call him as soon as possible.  He states she is very weak, cannot go from one year to another without going in a wheelchair, cough has returned, trouble breathing, nasal drainage is back again also.  Pharm is [facility].  CB [phone].

## 2016-11-03 NOTE — Telephone Encounter (Signed)
Please call patient and let her know I am going to have to speak with Dr. Lake Bells about next steps.Let them know we will call tomorrow after I speak with him. If she gets worse over night , have them seek emergency care.Thanks so much.

## 2016-11-04 ENCOUNTER — Encounter: Payer: Self-pay | Admitting: Acute Care

## 2016-11-04 ENCOUNTER — Ambulatory Visit (INDEPENDENT_AMBULATORY_CARE_PROVIDER_SITE_OTHER): Payer: Medicare Other | Admitting: Acute Care

## 2016-11-04 ENCOUNTER — Telehealth: Payer: Self-pay | Admitting: Acute Care

## 2016-11-04 VITALS — BP 122/74 | HR 117 | Ht 64.0 in | Wt 83.2 lb

## 2016-11-04 DIAGNOSIS — J449 Chronic obstructive pulmonary disease, unspecified: Secondary | ICD-10-CM

## 2016-11-04 DIAGNOSIS — J471 Bronchiectasis with (acute) exacerbation: Secondary | ICD-10-CM

## 2016-11-04 DIAGNOSIS — J9611 Chronic respiratory failure with hypoxia: Secondary | ICD-10-CM | POA: Diagnosis not present

## 2016-11-04 NOTE — Telephone Encounter (Signed)
Please call patient and see if they can come in to be seen today after my last scheduled patient at noon, so either 12:15 or 12:30.It is my afternoon off, so it would have to be 12:30 at the latest. Thanks so much.

## 2016-11-04 NOTE — Assessment & Plan Note (Signed)
Continue wearing oxygen at 2 L nasal cannula at rest, and 3 L nasal cannula with exertion. Oxygen saturation goals are between 88-92%

## 2016-11-04 NOTE — Telephone Encounter (Signed)
error 

## 2016-11-04 NOTE — Telephone Encounter (Signed)
Pt saw SG today and a referral to Hospice was made. BQ would you be willing to be pt's attending MD for Hopsice? Thanks!

## 2016-11-04 NOTE — Telephone Encounter (Signed)
Chart reviewed, labs from this week reviewed.  Difficult situation because her disease has really worsened in the last 6-12 months.  I have spoken to her PCP about this a few times in the last year and we feel she is reaching end stage disease.  There is likely little modern medicine can offer at this point other than palliative care.  This news should be delivered face to face, though know I have brought this up with them in the last year.    So either bring her in clinic and let her know that we need to make arrangements for hospice or recommend she go to the hospital where pulmonary can see her and then make arrangements afterwards.  If she goes to the hospital tell her to go to [facility] so I can round on her this weekend.

## 2016-11-04 NOTE — Telephone Encounter (Signed)
Yvette from Hospice called to ask who the attending physician would be.  I advised her that this is being decided now and will notify them when decision is made.  CB [phone].

## 2016-11-04 NOTE — Progress Notes (Signed)
History of Present Illness Destiny Rodriguez is a 83 y.o. female  Never smoker with COPD and MAC. She is oxygen dependent  And is followed by Dr. Lake Rodriguez.  Synopsis: Former Patient of Dr. Gwenette Rodriguez with COPD and MAC; She does better with longer courses of antibiotics (14 days) when she has flare ups. Rx with clarithro/ethamb 2000-2001 Started clarithro/ethamb/rif 08/2010 >finished 29mo of treatment ending May 2014. She experienced severe muscle cramps due to antibiotics.  Uses therapy vest twice a day. Uses hypertonic saline and albuterol twice daily.  Spirometry 2010: FEV1 1.26 (71%), ratio 65. Spirometry 2018 ratio 59%, FEV1 0.79 L 49% predicted Good response to LABA/ICS  AUGUST 2016 Sputum> pseudomonas, cipro sensitive August 2016 Sputum > MAC positive x2  Had pneumonia in March 2017. Refer to pulmonary rehabilitation in April 2017.      11/04/2016 Acute OV: Pt. Presents today for continued weakness and malaise , shortness of breath. She states she is tired , she just feels bad in general. She has been seen in the office with increasing frequency since May 2018. She came in with an exacerbation on June 11, and we treated with Cipro 7 days with short-term follow-up on June 11. At this interval her cough had improved, but she continued to have failure to thrive. She has been having to use her wheelchair at home. I had asked her husband Destiny Glaserto call me on Wednesday to let me know she was doing any better. On Wednesday he called letting me know she had continued weakness and just general malaise. Dr. MLake Bellshad spoken with both Destiny Darterand Destiny Rodriguez regarding the fact that she is nearing end stage of her disease. I have spoken with Dr. MLake Bellsto ensure that there was nothing else we can offer Destiny Rodriguez She has returned to the office today with Destiny Rodriguez her husband to discuss next steps. She is using her chest vest and albuterol treatments and flutter valve as she has been instructed. She is  very compliant with all interventions, and treatments. She is sleepy. I offered a blood gas to determine if her CO2 is elevated, but she does not want a blood gas because she does not want to have to use a BiPAP machine. I have discussed with her that we have reached a point where she needs to tell [place] she is ready to stop treatment, and that it is okay for her to tell [place]at she is tired. Her husband Destiny Glaseris here fully supporting her. They said that they had spoken with palliative care in the past, and that F[place] signed a DO NOT RESUSCITATE. They feel the time is right to call in hospice. She is currently afebrile, and not coughing up purulent secretions. She is wearing her oxygen at 2-3 L nasal cannula. She continues to complain of lack of appetite, however her weight has been stable over the last 3 visits.   Test Results: 10/25/2016 IMPRESSION: 1. No significant change. 2. Hyperinflated lungs with upper lobe thin wall cavitary lesions.  CBC Latest Ref Rng & Units 11/01/2016 03/26/2016 02/16/2016  WBC 4.0 - 10.5 K/uL 10.3 14.8(H) 9.2  Hemoglobin 12.0 - 15.0 g/dL 13.5 14.1 14.9  Hematocrit 36.0 - 46.0 % 40.8 42.6 45.5  Platelets 150.0 - 400.0 K/uL 268.0 197.0 290.0    BMP Latest Ref Rng & Units 11/01/2016 10/25/2016 04/05/2016  Glucose 70 - 99 mg/dL 117(H) 107(H) 99  BUN 6 - 23 mg/dL _0 Creatinine 0.40 - 1.20  mg/dL 0.63 0.58 0.56  Sodium 135 - 145 mEq/L 133(L) 135 136  Potassium 3.5 - 5.1 mEq/L 4.5 4.3 4.7  Chloride 96 - 112 mEq/L 96 95(L) 97  CO2 19 - 32 mEq/L 31 33(H) 34(H)  Calcium 8.4 - 10.5 mg/dL 9.3 10.2 9.5    BNP No results found for: BNP  ProBNP No results found for: PROBNP  PFT No results found for: FEV1PRE, FEV1POST, FVCPRE, FVCPOST, TLC, DLCOUNC, PREFEV1FVCRT, PSTFEV1FVCRT  Dg Chest 2 View  Result Date: 10/25/2016 CLINICAL DATA:  Short of breath. Bronchiectasis. Mycobacterium infection EXAM: CHEST  2 VIEW COMPARISON:  Radiograph 06/17/2016 FINDINGS: Normal  cardiac silhouette. Lungs are hyperinflated. Cavitary lesions in the upper lobes are not changed from prior. No acute airspace disease identified. No pneumothorax. IMPRESSION: 1. No significant change. 2. Hyperinflated lungs with upper lobe thin wall cavitary lesions. Electronically Signed   By: Destiny Rodriguez M.D.   On: 10/25/2016 11:52     Past medical hx Past Medical History:  Diagnosis Date  . Allergic rhinitis   . Bronchiectasis   . Chronic sinusitis   . Hyperlipidemia   . MAC (mycobacterium avium-intracellulare complex)   . Migraine   . Rheumatism      Social History  Substance Use Topics  . Smoking status: Never Smoker  . Smokeless tobacco: Never Used  . Alcohol use No    Tobacco Cessation: Never smoker  Past surgical hx, Family hx, Social hx all reviewed.  Current Outpatient Prescriptions on File Prior to Visit  Medication Sig  . albuterol (PROVENTIL) (2.5 MG/3ML) 0.083% nebulizer solution Take 3 mLs (2.5 mg total) by nebulization every 6 (six) hours as needed for wheezing or shortness of breath.  . Biotin 1000 MCG tablet Take 1,000 mcg by mouth daily.  . calcium carbonate (OS-CAL) 600 MG TABS Take 600 mg by mouth 2 (two) times daily with a meal.   . ENSURE (ENSURE) Take 237 mLs by mouth daily.  . mometasone-formoterol (DULERA) 200-5 MCG/ACT AERO Inhale 2 puffs into the lungs 2 (two) times daily.  . MULTIPLE VITAMIN PO Take 1 tablet by mouth daily.   . mupirocin cream (BACTROBAN) 2 % Apply 1 application topically 2 (two) times daily. To affected area as needed  . OXYGEN Inhale into the lungs at bedtime.  . polyethylene glycol (MIRALAX / GLYCOLAX) packet Take 8.5 g by mouth every other day.  Marland Kitchen PROAIR HFA 108 (90 Base) MCG/ACT inhaler USE 2 PUFFS EVERY 4 HOURS AS NEEDED FOR WHEEZE OR SHORTNESS OF BREATHOR BAD COUGH.  . ranitidine (ZANTAC) 150 MG tablet Take 1 tablet (150 mg total) by mouth daily after supper.  . sodium chloride HYPERTONIC 3 % nebulizer solution Take  by nebulization 2 (two) times daily.  . chlorpheniramine (CHLOR-TRIMETON) 4 MG tablet Take 4 mg by mouth at bedtime.   No current facility-administered medications on file prior to visit.      Allergies  Allergen Reactions  . Tequin Rash    Review Of Systems:  Constitutional:   No  weight loss, night sweats,  Fevers, chills, +fatigue, or  lassitude.  HEENT:   No headaches,  Difficulty swallowing,  Tooth/dental problems, or  Sore throat,                No sneezing, itching, ear ache, nasal congestion, post nasal drip,   CV:  No chest pain,  Orthopnea, PND, swelling in lower extremities, anasarca, dizziness, palpitations, syncope.   GI  + heartburn, no indigestion, abdominal pain, nausea,  vomiting, diarrhea, change in bowel habits,+ loss of appetite, no bloody stools.   Resp: + shortness of breath with exertion and  at rest.  No excess mucus, no productive cough,  No non-productive cough,  No coughing up of blood.  No change in color of mucus.  No wheezing.  No chest wall deformity  Skin: no rash or lesions.  GU: no dysuria, change in color of urine, no urgency or frequency.  No flank pain, no hematuria   MS:  No joint pain or swelling.  + decreased range of motion.  No back pain.  Psych:  No change in mood or affect. + depression or anxiety.  No memory loss.   Vital Signs BP 122/74 (BP Location: Right Arm, Cuff Size: Small)   Pulse (!) 117   Ht _0  (1.626 m)   Wt 83 lb 3.4 oz (37.7 kg)   SpO2 92%   BMI 14.28 kg/m   Body mass index is 14.28 kg/m.  Physical Exam:  General- No distress,  A&Ox3, frail, thin, elderly female in wheelchair wearing oxygen ENT: No sinus tenderness, TM clear, pale nasal mucosa, no oral exudate,no post nasal drip, no LAN Cardiac: S1, S2, regular rate and rhythm, no murmur Chest: No wheeze/ rales/ dullness; + accessory muscle use while talking, no nasal flaring, no sternal retractions, diminished per bases Abd.: Soft Non-tender, bowel sounds  positive, nondistended ,very thin Ext: No clubbing cyanosis, edema Neuro:  Alert and oriented 3, moving all extremities 4, following commands, weakness in upper and lower extremities, deconditioned at baseline. Skin: No rashes, warm and dry, thin and frail with some bruising noted Psych: Appropriate, appears fatigued and tired. Tearful at times when discussing hospice referral.   Assessment/Plan  Bronchiectasis with (acute) exacerbation (HCC) End-stage disease Worsening weakness and fatigue Plan We will get a hospice referral started today. Plan Will be for care at home. [place] has DNR paperwork signed. Continue with your nebulizer treatments and chest vest and flutter valve. Continue oxygen at 2-4 L nasal cannula as needed. Oxygen saturation goals are 88-92% Coninue your zantac at night. Continue trying to eat small frequent meals. Follow up as with Dr. Lake Rodriguez 12/14/2016 as is scheduled, however if you would like to cancel the appointment please just call and let us know Please contact office for sooner follow up if symptoms do not improve or worsen or seek emergency care  Chronic respiratory failure with hypoxia ([place]) Continue wearing oxygen at 2 L nasal cannula at rest, and 3 L nasal cannula with exertion. Oxygen saturation goals are between 88-92%    Magdalen Spatz, NP 11/04/2016  3:08 PM

## 2016-11-04 NOTE — Assessment & Plan Note (Signed)
End-stage disease Worsening weakness and fatigue Plan We will get a hospice referral started today. Plan Will be for care at home. [place]atha has DNR paperwork signed. Continue with your nebulizer treatments and chest vest and flutter valve. Continue oxygen at 2-4 L nasal cannula as needed. Oxygen saturation goals are 88-92% Coninue your zantac at night. Continue trying to eat small frequent meals. Follow up as with Dr. Lake Bells 12/14/2016 as is scheduled, however if you would like to cancel the appointment please just call and let us know Please contact office for sooner follow up if symptoms do not improve or worsen or seek emergency care

## 2016-11-04 NOTE — Telephone Encounter (Signed)
Spoke with the pt's spouse and have scheduled pt with SG for 12:00 and asked her to arrive at 12:15, may be 12:30 before can be seen.

## 2016-11-04 NOTE — Patient Instructions (Addendum)
It is good to see you today. We will get a hospice referral started today. Plan Will be for care at home. Destiny Rodriguez has DNR paperwork signed. Continue with your nebulizer treatments and chest vest and flutter valve. Continue oxygen at 2-4 L nasal cannula as needed. Oxygen saturation goals are 88-92% Coninue your zantac at night. Continue trying to eat small frequent meals. Follow up as with Dr. Lake Bells 12/14/2016 as is scheduled, however if you would like to cancel the appointment please just call and let us know Please contact office for sooner follow up if symptoms do not improve or worsen or seek emergency care

## 2016-11-05 ENCOUNTER — Emergency Department (HOSPITAL_COMMUNITY): Payer: Medicare Other

## 2016-11-05 ENCOUNTER — Encounter (HOSPITAL_COMMUNITY): Payer: Self-pay | Admitting: Emergency Medicine

## 2016-11-05 ENCOUNTER — Observation Stay (HOSPITAL_COMMUNITY)
Admission: EM | Admit: 2016-11-05 | Discharge: 2016-11-06 | Disposition: A | Discharge status: Home / Self Care | Payer: Medicare Other | Attending: Nephrology | Admitting: Nephrology

## 2016-11-05 DIAGNOSIS — J4489 Other specified chronic obstructive pulmonary disease: Secondary | ICD-10-CM | POA: Diagnosis present

## 2016-11-05 DIAGNOSIS — I480 Paroxysmal atrial fibrillation: Secondary | ICD-10-CM | POA: Insufficient documentation

## 2016-11-05 DIAGNOSIS — Z66 Do not resuscitate: Secondary | ICD-10-CM | POA: Diagnosis not present

## 2016-11-05 DIAGNOSIS — I4891 Unspecified atrial fibrillation: Secondary | ICD-10-CM | POA: Diagnosis not present

## 2016-11-05 DIAGNOSIS — Z7189 Other specified counseling: Secondary | ICD-10-CM

## 2016-11-05 DIAGNOSIS — Z8709 Personal history of other diseases of the respiratory system: Secondary | ICD-10-CM | POA: Insufficient documentation

## 2016-11-05 DIAGNOSIS — R0602 Shortness of breath: Secondary | ICD-10-CM | POA: Diagnosis present

## 2016-11-05 DIAGNOSIS — R069 Unspecified abnormalities of breathing: Secondary | ICD-10-CM | POA: Diagnosis not present

## 2016-11-05 DIAGNOSIS — Z515 Encounter for palliative care: Secondary | ICD-10-CM | POA: Diagnosis not present

## 2016-11-05 DIAGNOSIS — Z9981 Dependence on supplemental oxygen: Secondary | ICD-10-CM | POA: Diagnosis not present

## 2016-11-05 DIAGNOSIS — R000 Tachycardia, unspecified: Secondary | ICD-10-CM | POA: Diagnosis not present

## 2016-11-05 DIAGNOSIS — J189 Pneumonia, unspecified organism: Secondary | ICD-10-CM | POA: Diagnosis not present

## 2016-11-05 DIAGNOSIS — Z7951 Long term (current) use of inhaled steroids: Secondary | ICD-10-CM | POA: Diagnosis not present

## 2016-11-05 DIAGNOSIS — J440 Chronic obstructive pulmonary disease with acute lower respiratory infection: Secondary | ICD-10-CM | POA: Insufficient documentation

## 2016-11-05 DIAGNOSIS — Z79899 Other long term (current) drug therapy: Secondary | ICD-10-CM | POA: Diagnosis not present

## 2016-11-05 DIAGNOSIS — J449 Chronic obstructive pulmonary disease, unspecified: Secondary | ICD-10-CM

## 2016-11-05 DIAGNOSIS — J441 Chronic obstructive pulmonary disease with (acute) exacerbation: Secondary | ICD-10-CM | POA: Diagnosis not present

## 2016-11-05 HISTORY — DX: Unspecified osteoarthritis, unspecified site: M19.90

## 2016-11-05 HISTORY — DX: Chronic obstructive pulmonary disease, unspecified: J44.9

## 2016-11-05 LAB — CBC
HCT: 42 % (ref 36.0–46.0)
Hemoglobin: 13.9 g/dL (ref 12.0–15.0)
MCH: 30.4 pg (ref 26.0–34.0)
MCHC: 33.1 g/dL (ref 30.0–36.0)
MCV: 91.9 fL (ref 78.0–100.0)
PLATELETS: 271 10*3/uL (ref 150–400)
RBC: 4.57 MIL/uL (ref 3.87–5.11)
RDW: 14.3 % (ref 11.5–15.5)
WBC: 20.3 10*3/uL — AB (ref 4.0–10.5)

## 2016-11-05 LAB — I-STAT TROPONIN, ED: TROPONIN I, POC: 0.02 ng/mL (ref 0.00–0.08)

## 2016-11-05 LAB — COMPREHENSIVE METABOLIC PANEL
ALT: 18 U/L (ref 14–54)
AST: 31 U/L (ref 15–41)
Albumin: 2.4 g/dL — ABNORMAL LOW (ref 3.5–5.0)
Alkaline Phosphatase: 131 U/L — ABNORMAL HIGH (ref 38–126)
Anion gap: 10 (ref 5–15)
BUN: 15 mg/dL (ref 6–20)
CO2: 29 mmol/L (ref 22–32)
Calcium: 9.5 mg/dL (ref 8.9–10.3)
Chloride: 94 mmol/L — ABNORMAL LOW (ref 101–111)
Creatinine, Ser: 0.53 mg/dL (ref 0.44–1.00)
Glucose, Bld: 162 mg/dL — ABNORMAL HIGH (ref 65–99)
POTASSIUM: 4.8 mmol/L (ref 3.5–5.1)
SODIUM: 133 mmol/L — AB (ref 135–145)
Total Bilirubin: 1.1 mg/dL (ref 0.3–1.2)
Total Protein: 6.7 g/dL (ref 6.5–8.1)

## 2016-11-05 MED ORDER — MOMETASONE FURO-FORMOTEROL FUM 200-5 MCG/ACT IN AERO
2.0000 | INHALATION_SPRAY | Freq: Two times a day (BID) | RESPIRATORY_TRACT | Status: DC
  Administered 2016-11-05 – 2016-11-06 (×2): 2 via RESPIRATORY_TRACT
  Filled 2016-11-05: qty 8.8

## 2016-11-05 MED ORDER — ALBUTEROL SULFATE (2.5 MG/3ML) 0.083% IN NEBU: 2.5000 mg | INHALATION_SOLUTION | Freq: Four times a day (QID) | RESPIRATORY_TRACT | Status: DC | PRN

## 2016-11-05 MED ORDER — DILTIAZEM HCL 60 MG PO TABS
60.0000 mg | ORAL_TABLET | Freq: Once | ORAL | Status: AC
  Administered 2016-11-05: 60 mg via ORAL
  Filled 2016-11-05: qty 1

## 2016-11-05 MED ORDER — ENSURE ENLIVE PO LIQD
237.0000 mL | Freq: Two times a day (BID) | ORAL | Status: DC
  Administered 2016-11-06: 237 mL via ORAL

## 2016-11-05 MED ORDER — ORAL CARE MOUTH RINSE
15.0000 mL | Freq: Two times a day (BID) | OROMUCOSAL | Status: DC
  Administered 2016-11-06: 15 mL via OROMUCOSAL

## 2016-11-05 MED ORDER — AZITHROMYCIN 250 MG PO TABS
500.0000 mg | ORAL_TABLET | ORAL | Status: DC
  Administered 2016-11-05: 500 mg via ORAL
  Filled 2016-11-05: qty 2

## 2016-11-05 MED ORDER — DILTIAZEM HCL-DEXTROSE 100-5 MG/100ML-% IV SOLN (PREMIX)
5.0000 mg/h | INTRAVENOUS | Status: DC
  Administered 2016-11-05: 5 mg/h via INTRAVENOUS
  Filled 2016-11-05: qty 100

## 2016-11-05 MED ORDER — ONDANSETRON HCL 4 MG PO TABS: 4.0000 mg | ORAL_TABLET | Freq: Four times a day (QID) | ORAL | Status: DC | PRN

## 2016-11-05 MED ORDER — ACETAMINOPHEN 325 MG PO TABS
650.0000 mg | ORAL_TABLET | Freq: Four times a day (QID) | ORAL | Status: DC | PRN
  Administered 2016-11-06: 650 mg via ORAL
  Filled 2016-11-05: qty 2

## 2016-11-05 MED ORDER — DILTIAZEM LOAD VIA INFUSION
10.0000 mg | Freq: Once | INTRAVENOUS | Status: AC
  Administered 2016-11-05: 10 mg via INTRAVENOUS
  Filled 2016-11-05: qty 10

## 2016-11-05 MED ORDER — SODIUM CHLORIDE 0.9 % IV BOLUS (SEPSIS)
1000.0000 mL | Freq: Once | INTRAVENOUS | Status: AC
  Administered 2016-11-05: 1000 mL via INTRAVENOUS

## 2016-11-05 MED ORDER — DILTIAZEM HCL 25 MG/5ML IV SOLN: 15.0000 mg | Freq: Once | INTRAVENOUS | Status: DC

## 2016-11-05 MED ORDER — SODIUM CHLORIDE 0.9% FLUSH
3.0000 mL | Freq: Two times a day (BID) | INTRAVENOUS | Status: DC
  Administered 2016-11-05: 3 mL via INTRAVENOUS

## 2016-11-05 MED ORDER — VANCOMYCIN HCL IN DEXTROSE 1-5 GM/200ML-% IV SOLN
1000.0000 mg | Freq: Once | INTRAVENOUS | Status: AC
  Administered 2016-11-05: 1000 mg via INTRAVENOUS
  Filled 2016-11-05: qty 200

## 2016-11-05 MED ORDER — ENOXAPARIN SODIUM 30 MG/0.3ML ~~LOC~~ SOLN
20.0000 mg | SUBCUTANEOUS | Status: DC
  Administered 2016-11-06: 20 mg via SUBCUTANEOUS
  Filled 2016-11-05: qty 0.2
  Filled 2016-11-05: qty 0.3
  Filled 2016-11-05: qty 0.2
  Filled 2016-11-05: qty 0.3

## 2016-11-05 MED ORDER — DEXTROSE 5 % IV SOLN: 1.0000 g | INTRAVENOUS | Status: DC

## 2016-11-05 MED ORDER — SODIUM CHLORIDE 0.9 % IV SOLN
1000.0000 mL | INTRAVENOUS | Status: DC
  Administered 2016-11-05: 1000 mL via INTRAVENOUS

## 2016-11-05 MED ORDER — SODIUM CHLORIDE 0.9 % IV SOLN
INTRAVENOUS | Status: DC
  Administered 2016-11-05: 19:00:00 via INTRAVENOUS

## 2016-11-05 MED ORDER — DEXTROSE 5 % IV SOLN
2.0000 g | Freq: Once | INTRAVENOUS | Status: AC
  Administered 2016-11-05: 2 g via INTRAVENOUS
  Filled 2016-11-05: qty 2

## 2016-11-05 MED ORDER — DEXTROSE 5 % IV SOLN
1.0000 g | INTRAVENOUS | Status: DC
  Administered 2016-11-05: 1 g via INTRAVENOUS
  Filled 2016-11-05 (×2): qty 10

## 2016-11-05 MED ORDER — ADULT MULTIVITAMIN W/MINERALS CH
1.0000 | ORAL_TABLET | Freq: Every day | ORAL | Status: DC
  Administered 2016-11-06: 1 via ORAL
  Filled 2016-11-05: qty 1

## 2016-11-05 MED ORDER — MORPHINE SULFATE (CONCENTRATE) 10 MG/0.5ML PO SOLN: 2.5000 mg | ORAL | Status: DC | PRN

## 2016-11-05 MED ORDER — VANCOMYCIN HCL 500 MG IV SOLR: 500.0000 mg | INTRAVENOUS | Status: DC

## 2016-11-05 MED ORDER — ACETAMINOPHEN 160 MG/5ML PO SOLN
650.0000 mg | Freq: Once | ORAL | Status: AC
  Administered 2016-11-05: 650 mg via ORAL
  Filled 2016-11-05: qty 20.3

## 2016-11-05 MED ORDER — DILTIAZEM HCL 30 MG PO TABS
30.0000 mg | ORAL_TABLET | Freq: Two times a day (BID) | ORAL | Status: DC
  Administered 2016-11-05 – 2016-11-06 (×2): 30 mg via ORAL
  Filled 2016-11-05 (×2): qty 1

## 2016-11-05 MED ORDER — ACETAMINOPHEN 650 MG RE SUPP: 650.0000 mg | Freq: Four times a day (QID) | RECTAL | Status: DC | PRN

## 2016-11-05 MED ORDER — ONDANSETRON HCL 4 MG/2ML IJ SOLN: 4.0000 mg | Freq: Four times a day (QID) | INTRAMUSCULAR | Status: DC | PRN

## 2016-11-05 NOTE — ED Notes (Signed)
Pt began c/o chest pain and noted to have heart rate in 150-160's. EKG obtained at [place] notified.

## 2016-11-05 NOTE — ED Notes (Signed)
Pt accidentally removed IV in forearm. Abx restarted in the second IV.

## 2016-11-05 NOTE — Progress Notes (Signed)
[facility]) Hospital Liaison:  RN visit  Notified by [employer] of pending referral called in by Dr. Curt Jews.  I spoke with Mariann Laster, [facility], and she will keep me up to date on whether patient will be admitted or will be going home today.  Request for [facility] services at home after discharge.  Chart and patient information under review by [facility] physician.  Hospice eligibility pending at this time.   Writer spoke with patient, husband and another family member at bedside to initiate education related to hospice philosophy, services and team approach to care. Patient/family verbalized understanding of information given.  Per discussion, plan is still pending as to whether patient will be admitted to hospital or going home from the ED.  Will continue to monitor.  Please send signed and completed DNR form home with patient/family.  Patient will need prescriptions for discharge comfort medications.    DME needs have been discussed.  Patient has a walker and O2 concentrator in the home.  Patient needs new O2 concentrator from [facility] and will need to cancel service with current provider of O2 which is [facility].  Patient will need portable O2 and O2 concentrator in the home.  [facility] equipment manager has been notified and will contact [place] to arrange delivery to the home.  Home address has been verified and is correct in the chart.  Nioma Mccubbins, husband, is the family member to contact to arrange time of delivery at [phone].    [facility] Referral Center aware of the above.  Completed discharge summary will need to be faxed to [facility] at [phone] when final.  Please notify [facility] when patient is ready to leave th unit at discharge.  Call [phone] or [phone] after 5pm.  [facility] information and contact numbers given to Laurance Flatten, husband, during the visit.  Above information shared with Mariann Laster, [facility].  Please call with any hospice related questions.  Thank you  for this referral.  Edyth Gunnels, RN, [facility] Liaison [phone]  All hospital liaisons are now on [place].

## 2016-11-05 NOTE — ED Notes (Signed)
ED Provider at bedside. 

## 2016-11-05 NOTE — Progress Notes (Addendum)
CM was contacted by Amy liaison from [facility] concerning receiving a community referral from patient's PCP yesterday. Patient was noted to be in the ED evaluation was done in the ED. Patient will be discharged on Home Hospice. [facility] will need to be notified prior to discharge from hospital. CM will need to coordinated discharge.

## 2016-11-05 NOTE — Progress Notes (Signed)
Reviewed, agree 

## 2016-11-05 NOTE — H&P (Signed)
[facility] History and Physical  Destiny Rodriguez ZOX:[id number] DOB: [birth year] DOA: 11/05/2016   PCP: Lajean Manes, MD  Specialists: Dr. Lake Bells is her pulmonologist  Chief Complaint: Shortness of breath  HPI: Destiny Rodriguez is a 83 y.o. female with a past medical history of COPD, MAC, bronchiectasis, who was recently seen by her pulmonologist and is thought to have end-stage lung disease. She was being transitioned to hospice at home. This morning she woke up with shortness of breath, cough. She decided to come into the hospital for further evaluation. She was given breathing treatments in the emergency department and started feeling better but then she went into atrial fibrillation with RVR. She denies any chest pain currently, although was having chest pain earlier when her heart rate went up. Denies any nausea, vomiting. No headaches. No dysuria.  Home Medications: Prior to Admission medications   Medication Sig Start Date End Date Taking? Authorizing Provider  albuterol (PROVENTIL) (2.5 MG/3ML) 0.083% nebulizer solution Take 3 mLs (2.5 mg total) by nebulization every 6 (six) hours as needed for wheezing or shortness of breath. Patient taking differently: Take 2.5 mg by nebulization 2 (two) times daily.  08/04/16  Yes Juanito Doom, MD  Biotin 1000 MCG tablet Take 1,000 mcg by mouth daily.   Yes [provider]  mometasone-formoterol (DULERA) 200-5 MCG/ACT AERO Inhale 2 puffs into the lungs 2 (two) times daily. 07/21/16  Yes Juanito Doom, MD  Multiple Vitamin (MULTIVITAMIN WITH MINERALS) TABS tablet Take 1 tablet by mouth daily.   Yes [provider]  OXYGEN Inhale 2-3 L into the lungs continuous. Uses 3 Liters during the day and 2 liters at night   Yes [provider]  PROAIR HFA 108 (90 Base) MCG/ACT inhaler USE 2 PUFFS EVERY 4 HOURS AS NEEDED FOR WHEEZE OR SHORTNESS OF BREATHOR BAD COUGH. 03/22/16  Yes McQuaid, Nathaneil Canary B, MD  sodium  chloride HYPERTONIC 3 % nebulizer solution Take by nebulization 2 (two) times daily. Patient taking differently: Take 4 mLs by nebulization 2 (two) times daily.  08/04/16  Yes Juanito Doom, MD  calcium carbonate (OS-CAL) 600 MG TABS Take 600 mg by mouth 2 (two) times daily with a meal.     [provider]  chlorpheniramine (CHLOR-TRIMETON) 4 MG tablet Take 4 mg by mouth at bedtime.    [provider]  ENSURE (ENSURE) Take 237 mLs by mouth daily.    [provider]  MULTIPLE VITAMIN PO Take 1 tablet by mouth daily.     [provider]  mupirocin cream (BACTROBAN) 2 % Apply 1 application topically 2 (two) times daily. To affected area as needed 09/18/15   Carlyle Basques, MD  polyethylene glycol [facility] / GLYCOLAX) packet Take 8.5 g by mouth every other day.    [provider]  ranitidine (ZANTAC) 150 MG tablet Take 1 tablet (150 mg total) by mouth daily after supper. 08/22/15   Magdalen Spatz, NP    Allergies:  Allergies  Allergen Reactions  . Tequin Rash    Past Medical History: Past Medical History:  Diagnosis Date  . Allergic rhinitis   . Arthritis   . Bronchiectasis   . Chronic sinusitis   . COPD (chronic obstructive pulmonary disease) ([place])   . Hyperlipidemia   . MAC (mycobacterium avium-intracellulare complex)   . Migraine   . Rheumatism     Past Surgical History:  Procedure Laterality Date  . CYSTOCELE REPAIR    . TONSILLECTOMY    .  VESICOVAGINAL FISTULA CLOSURE W/ TAH      Social History:  Social History   Social History  . Marital status: Married    Spouse name: N/A  . Number of children: N/A  . Years of education: N/A   Occupational History  . Retired    Social History Main Topics  . Smoking status: Never Smoker  . Smokeless tobacco: Never Used  . Alcohol use No  . Drug use: No  . Sexual activity: Not on file   Other Topics Concern  . Not on file   Social History Narrative   Children   No pets             Family History:  Family History  Problem Relation Age of Onset  . Emphysema Father   . Asthma Father   . Heart disease Father   . Clotting disorder Mother   . Breast cancer Unknown        Aunt     Review of Systems - History obtained from the patient General ROS: positive for  - fatigue Psychological ROS: negative Ophthalmic ROS: negative ENT ROS: negative Allergy and Immunology ROS: negative Hematological and Lymphatic ROS: negative Endocrine ROS: negative Respiratory ROS: As in history of present illness Cardiovascular ROS: no chest pain or dyspnea on exertion Gastrointestinal ROS: no abdominal pain, change in bowel habits, or black or bloody stools Genito-Urinary ROS: no dysuria, trouble voiding, or hematuria Musculoskeletal ROS: negative Neurological ROS: no TIA or stroke symptoms Dermatological ROS: negative  Physical Examination  Vitals:   11/05/16 1530 11/05/16 1603 11/05/16 1631 11/05/16 1704  BP: (!) 86/63 (!) 88/43 (!) 104/47 (!) 98/46  Pulse: (!) 127 97 88 84  Resp: (!) 28 (!) 26 (!) 22 16  Temp:      TempSrc:      SpO2: 94% 94% 96% 97%    BP (!) 98/46 (BP Location: Right Arm)   Pulse 84   Temp 98.1 F (36.7 C)   Resp 16   SpO2 97%   General appearance: alert, cooperative, appears stated age, no distress and Cachectic Head: Normocephalic, without obvious abnormality, atraumatic Eyes: conjunctivae/corneas clear. PERRL, EOM's intact.  Throat: lips, mucosa, and tongue normal; teeth and gums normal Resp: Coarse breath sounds bilaterally. Crackles at the bases bilaterally. Cardio: regular rate and rhythm, S1, S2 normal, no murmur, click, rub or gallop GI: soft, non-tender; bowel sounds normal; no masses,  no organomegaly Extremities: extremities normal, atraumatic, no cyanosis or edema Pulses: 2+ and symmetric Skin: Skin color, texture, turgor normal. No rashes or lesions Neurologic: No obvious focal neurological deficits.   Labs on  Admission: I have personally reviewed following labs and imaging studies  CBC:  Recent Labs Lab 11/01/16 1252 11/05/16 1223  WBC 10.3 20.3*  NEUTROABS 8.4*  --   HGB 13.5 13.9  HCT 40.8 42.0  MCV 92.8 91.9  PLT 268.0 696   Basic Metabolic Panel:  Recent Labs Lab 11/01/16 1252 11/05/16 1223  NA 133* 133*  K 4.5 4.8  CL 96 94*  CO2 31 29  GLUCOSE 117* 162*  BUN 18 15  CREATININE 0.63 0.53  CALCIUM 9.3 9.5   GFR: Estimated Creatinine Clearance: 27.8 mL/min (by C-G formula based on SCr of 0.53 mg/dL). Liver Function Tests:  Recent Labs Lab 11/05/16 1223  AST 31  ALT 18  ALKPHOS 131*  BILITOT 1.1  PROT 6.7  ALBUMIN 2.4*     Radiological Exams on Admission: Dg Chest [facility] 1 [address]  Result Date: 11/05/2016 CLINICAL DATA:  Shortness of breath EXAM: PORTABLE CHEST 1 VIEW COMPARISON:  10/25/2016 FINDINGS: There is advanced chronic lung disease with emphysema and chronic cavities. Bronchiectasis and airway thickening. New airspace opacity at the bases, presumably pneumonia. No effusion or pneumothorax. Stable borderline cardiomegaly. IMPRESSION: Pneumonia at the bases. Background chronic lung disease with bronchiectasis, scarring, and chronic cavities. Electronically Signed   By: Monte Fantasia M.D.   On: 11/05/2016 13:12    My interpretation of Electrocardiogram: Atrial fibrillation at the rate of 1 50 bpm.   Problem List  Principal Problem:   CAP (community acquired pneumonia) Active Problems:   COPD (chronic obstructive pulmonary disease) with chronic bronchitis (HCC)   Atrial fibrillation with RVR (HCC)   Assessment: This is a 83 year old Caucasian female who has end-stage lung disease who was in the process of transitioning to hospice, who presented with shortness of breath this morning. EMS was called. She was brought into the hospital. X-ray shows pneumonia. She also went into rapid A. fib.  Plan: #1 Community-acquired pneumonia: Treat with ceftriaxone and  azithromycin. Anticipate transitioning to oral antibiotics tomorrow prior to discharge.  #2 Paroxysmal Atrial fibrillation with RVR: Most likely triggered by her dyspnea. She has converted back to sinus rhythm. She will be placed on low-dose Cardizem. Her blood pressure is noted to be low. She'll be given IV fluids. Monitor on telemetry overnight. Due to her other comorbidities and the fact that she will be under hospice care she's not really a candidate for any anticoagulation at this time.  #3 history of COPD, bronchiectasis and MAC. This is thought to be end-stage. She is followed by pulmonology. Hospice services have been set up. If she stabilizes, she could go back home with hospice tomorrow. Continue with home medications.   DVT Prophylaxis: Lovenox Code Status: DO NOT RESUSCITATE Family Communication: Discussed with the patient and her daughter  Consults called: None  Admission status: Observation   Severity of Illness: The appropriate patient status for this patient is OBSERVATION. Observation status is judged to be reasonable and necessary in order to provide the required intensity of service to ensure the patient's safety. The patient's presenting symptoms, physical exam findings, and initial radiographic and laboratory data in the context of their medical condition is felt to place them at decreased risk for further clinical deterioration. Furthermore, it is anticipated that the patient will be medically stable for discharge from the hospital within 2 midnights of admission. The following factors support the patient status of observation.   " The patient's presenting symptoms include shortness of breath. " The physical exam findings include crackles in the lung. " The initial radiographic and laboratory data are pneumonia.  Further management decisions will depend on results of further testing and patient's response to treatment.   [facility]  [facility] Pager  [phone]  If 7PM-7AM, please contact night-coverage [email or web address] Password [facility]  11/05/2016, 5:59 PM

## 2016-11-05 NOTE — Telephone Encounter (Signed)
I'm happy to be her hospice doctor.

## 2016-11-05 NOTE — Consult Note (Signed)
Consultation Note Date: 11/05/2016   Patient Name: Destiny Rodriguez  DOB: [birth year]  MRN: [id number]  Age / Sex: 83 y.o., female  PCP: Lajean Manes, MD Referring Physician: Bonnielee Haff, MD  Reason for Consultation: Establishing goals of care and Non pain symptom management  HPI/Patient Profile: 83 y.o. female  with past medical history of end stage lung disease, COPD, MAC, bronchiectasis admitted on 11/05/2016 with worsening SOB.  She was recently referred to hospice who evaluated her in the ED.  Palliative consulted for goals of care.   Clinical Assessment and Goals of Care: I met today with patient, her husband, and her daughter. We discussed clinical course as well as wishes moving forward in regard to advanced directives.  We discussed difference between a aggressive medical intervention path and a palliative, comfort focused care path.  Values and goals of care important to patient and family were attempted to be elicited.  Concept of Hospice and Palliative Care were discussed  Questions and concerns addressed.   PMT will continue to support holistically.  SUMMARY OF RECOMMENDATIONS   - Plan for home with hospice (hopefully as soon as tomorrow) - Plan for addition of morphine as needed for dyspnea - On discharge, would recommend scripts for: - Morphine Concentrate 66m/0.5ml: 581m(0.2564msublingual every 1 hour as needed for pain or shortness of breath: Disp 71m44mLorazepam 2mg/93mconcentrated solution: 1mg (20mml) s71mingual every 4 hours as needed for anxiety: Disp 71ml - 59mol 2mg/ml s34mtion: 0.5mg (0.2573m subl80mal every 4 hours as needed for agitation or nausea: Disp 71ml   Code52mtus/Advance Care Planning:  DNR  Psycho-social/Spiritual:   Desire for further Chaplaincy support:no  Additional Recommendations: Education on Hospice  Prognosis:   < 6 months  Discharge  Planning: Home with Hospice      Primary Diagnoses: Present on Admission: . CAP (community acquired pneumonia) . Atrial fibrillation with RVR (HCC) . COPD Searcyronic obstructive pulmonary disease) with chronic bronchitis (HCC)   I havOrleanseviewed the medical record, interviewed the patient and family, and examined the patient. The following aspects are pertinent.  Past Medical History:  Diagnosis Date  . Allergic rhinitis   . Arthritis   . Bronchiectasis   . Chronic sinusitis   . COPD (chronic obstructive pulmonary disease) (HCC)   . HypSevernipidemia   . MAC (mycobacterium avium-intracellulare complex)   . Migraine   . Rheumatism    Social History   Social History  . Marital status: Married    Spouse name: N/A  . Number of children: N/A  . Years of education: N/A   Occupational History  . Retired    Social History Main Topics  . Smoking status: Never Smoker  . Smokeless tobacco: Never Used  . Alcohol use No  . Drug use: No  . Sexual activity: Not Asked   Other Topics Concern  . None   Social History Narrative   Children   No pets         Family History  Problem Relation Age  of Onset  . Emphysema Father   . Asthma Father   . Heart disease Father   . Clotting disorder Mother   . Breast cancer Unknown        Aunt   Scheduled Meds: . azithromycin  500 mg Oral Q24H  . diltiazem  30 mg Oral Q12H  . enoxaparin (LOVENOX) injection  20 mg Subcutaneous Q24H  . [START ON 11/06/2016] feeding supplement (ENSURE ENLIVE)  237 mL Oral BID BM  . mouth rinse  15 mL Mouth Rinse BID  . mometasone-formoterol  2 puff Inhalation BID  . [START ON 11/06/2016] multivitamin with minerals  1 tablet Oral Daily  . sodium chloride flush  3 mL Intravenous Q12H   Continuous Infusions: . sodium chloride 75 mL/hr at 11/05/16 1909  . cefTRIAXone (ROCEPHIN)  IV 1 g (11/05/16 1909)   PRN Meds:.acetaminophen **OR** acetaminophen, albuterol, morphine, ondansetron **OR** ondansetron  (ZOFRAN) IV Medications Prior to Admission:  Prior to Admission medications   Medication Sig Start Date End Date Taking? Authorizing Provider  albuterol (PROVENTIL) (2.5 MG/3ML) 0.083% nebulizer solution Take 3 mLs (2.5 mg total) by nebulization every 6 (six) hours as needed for wheezing or shortness of breath. Patient taking differently: Take 2.5 mg by nebulization 2 (two) times daily.  08/04/16  Yes Juanito Doom, MD  Biotin 1000 MCG tablet Take 1,000 mcg by mouth daily.   Yes [provider]  calcium carbonate (OS-CAL) 600 MG TABS Take 600 mg by mouth 2 (two) times daily with a meal.    Yes [provider]  chlorpheniramine (CHLOR-TRIMETON) 4 MG tablet Take 4 mg by mouth at bedtime.   Yes [provider]  ENSURE (ENSURE) Take 118 mLs by mouth daily.    Yes [provider]  mometasone-formoterol (DULERA) 200-5 MCG/ACT AERO Inhale 2 puffs into the lungs 2 (two) times daily. 07/21/16  Yes Juanito Doom, MD  Multiple Vitamin (MULTIVITAMIN WITH MINERALS) TABS tablet Take 1 tablet by mouth daily.   Yes [provider]  OXYGEN Inhale 2-3 L into the lungs continuous. Uses 3 Liters during the day and 2 liters at night   Yes [provider]  PROAIR HFA 108 (90 Base) MCG/ACT inhaler USE 2 PUFFS EVERY 4 HOURS AS NEEDED FOR WHEEZE OR SHORTNESS OF BREATHOR BAD COUGH. 03/22/16  Yes Juanito Doom, MD  ranitidine (ZANTAC) 150 MG tablet Take 1 tablet (150 mg total) by mouth daily after supper. 08/22/15  Yes Magdalen Spatz, NP  sodium chloride HYPERTONIC 3 % nebulizer solution Take by nebulization 2 (two) times daily. Patient taking differently: Take 4 mLs by nebulization 2 (two) times daily.  08/04/16  Yes Juanito Doom, MD   Allergies  Allergen Reactions  . Tequin Rash   Review of Systems Currently reports no complaints.  Physical Exam General: Alert, awake, in no acute distress. Frail and elderly. HEENT: No bruits, no goiter, no  JVD Heart: Regular rate and rhythm. No murmur appreciated. Lungs: Diminished air movement Abdomen: Soft, nontender, nondistended, positive bowel sounds.  Ext: No significant edema Skin: Warm and dry Neuro: Grossly intact, nonfocal.   Vital Signs: BP (!) 92/43 (BP Location: Left Arm)   Pulse 74   Temp 98.1 F (36.7 C)   Resp 18   Ht _0  (1.626 m)   Wt 40.8 kg (89 lb 15.2 oz)   SpO2 95%   BMI 15.44 kg/m  Pain Assessment: No/denies pain   Pain Score: 2    SpO2:  SpO2: 95 % O2 Device:SpO2: 95 % O2 Flow Rate: .O2 Flow Rate (L/min): 3.5 L/min  IO: Intake/output summary:  Intake/Output Summary (Last 24 hours) at 11/05/16 1910 Last data filed at 11/05/16 1909  Gross per 24 hour  Intake             2330 ml  Output                0 ml  Net             2330 ml    LBM:   Baseline Weight: Weight: 40.8 kg (89 lb 15.2 oz) Most recent weight: Weight: 40.8 kg (89 lb 15.2 oz)     Palliative Assessment/Data:   Flowsheet Rows     Most Recent Value  Intake Tab  Referral Department  -- [ED]  Unit at Time of Referral  ER  Palliative Care Primary Diagnosis  Pulmonary  Date Notified  11/05/16  Palliative Care Type  New Palliative care  Reason for referral  Clarify Goals of Care, Counsel Regarding Hospice  Date of Admission  11/03/16  # of days IP prior to Palliative referral  2  Clinical Assessment  Psychosocial & Spiritual Assessment  Palliative Care Outcomes      Time In: 1800 Time Out: 1900 Time Total: 60 Greater than 50%  of this time was spent counseling and coordinating care related to the above assessment and plan.  Signed by: Micheline Rough, MD   Please contact Palliative Medicine Team phone at [phone] for questions and concerns.  For individual provider: See Shea Evans

## 2016-11-05 NOTE — Telephone Encounter (Signed)
OK- noted and will forward to BQ so he is aware

## 2016-11-05 NOTE — ED Triage Notes (Signed)
Arrived via [facility], pt is from home, hx of COPD and wears oxygen at 3lpm. Pt c/o of worsening SOB without relief from usual treatments. Per EMS, pt was wheezing on arrival and O2 sats were 88% on oxygen. Pt was given 5mg  albuterol and 2 duoneb treatments PTA and 125mg  solumedrol. Pt reports feeling better. Pt has a new referral to hospice.

## 2016-11-05 NOTE — Telephone Encounter (Signed)
OK 

## 2016-11-05 NOTE — Clinical Social Work Note (Signed)
Clinical Social Work Assessment  Patient Details  Name: DAANYA LANPHIER MRN: [id number] Date of Birth: December 17, 1924  Date of referral:  11/05/16               Reason for consult:  Facility Placement, End of Life/Hospice                Permission sought to share information with:  Facility Art therapist granted to share information::  Yes, Verbal Permission Granted  Name::        Agency::     Relationship::     Contact Information:     Housing/Transportation Living arrangements for the past 2 months:  [place] of Information:  Spouse Patient Interpreter Needed:  None Criminal Activity/Legal Involvement Pertinent to Current Situation/Hospitalization:    Significant Relationships:  Spouse Lives with:  Facility Resident, Spouse Do you feel safe going back to the place where you live?  Yes Need for family participation in patient care:  No (Coment)  Care giving concerns:  Pt's husband Sharnee Douglass at ph: [phone] stated his expectation is that pt will be D/C'd before three days has passed (pt has Medicare A&B) and pt's husband's and pt's plan plan is for the pt to return home with Hospice.    Social Worker assessment / plan:  CSW met with pt's husbandand confirmed pt's plan to be discharged home with Hospice, as well as with Home Health to live at discharge.  CSW provided active listening and validated pt's concerns.   CSW confirmed pt's husband is aware that [place] has SNF facilities should pt's wife need it before returning home should this be helpful to pt and pt's husband.  Pt's husband gave permission for CSW Dept to complete FL-2 and send referral out to [facility] if necessary, via the hub per pt's request and asked that this be discussed with pt's husband.  Pt has been living in independent-living at [facility], prior to being admitted to [facility].  Employment status:    Insurance information:  Medicare,  Managed Care PT Recommendations:  Not assessed at this time Information / Referral to community resources:     Patient/Family's Response to care:  Patient alert and oriented.  Patient's husband agreeable to plan.  Pt's husband supportive and strongly involved in pt.'s care.  Pt.'s husband pleasant and appreciated CSW intervention.   Patient/Family's Understanding of and Emotional Response to Diagnosis, Current Treatment, and Prognosis:  Still assessing  Emotional Assessment Appearance:    Attitude/Demeanor/Rapport:    Affect (typically observed):  Unable to Assess (Pt was being seen by staff, CSW spoke to pt's husband) Orientation:  Oriented to Self, Oriented to Place, Oriented to  Time, Oriented to Situation Alcohol / Substance use:    Psych involvement (Current and /or in the community):     Discharge Needs  Concerns to be addressed:   (Pt has been seen by Hospice in the ED) Readmission within the last 30 days:  No Current discharge risk:  Terminally ill Barriers to Discharge:      Claudine Mouton, LCSWA 11/05/2016, 6:54 PM

## 2016-11-05 NOTE — Telephone Encounter (Signed)
Spoke with Dewaine Oats and advised BQ is okay to be her attending for Hospice  She states that Dr. Felipa Eth has also agreed to do this  They will let the pt choose which one and then call back to let us know  Will await the call

## 2016-11-05 NOTE — ED Notes (Signed)
Bed: WA17 Expected date:  Expected time:  Means of arrival:  Comments: EMS COPD  

## 2016-11-05 NOTE — Progress Notes (Signed)
Pharmacy Antibiotic Note  Destiny Rodriguez is a 83 y.o. female admitted on 11/05/2016 with HCAP.  Pharmacy has been consulted for vancomycin/cefepime dosing.   Pt with long stand lung disease. Pt with PMH of COPD and MAC. Followed by [facility].  Plan: Cefepime 2 gr IV 1, then cefepime 1 gr IV q24h  Vancomycin 100 mg IV x1, then vancomycin 500 mg IV q24h Monitor clinical course, renal function, cultures as available      Temp (24hrs), Avg:100.7 F (38.2 C), Min:100.7 F (38.2 C), Max:100.7 F (38.2 C)   Recent Labs Lab 11/01/16 1252 11/05/16 1223  WBC 10.3 20.3*  CREATININE 0.63 0.53    Estimated Creatinine Clearance: 27.8 mL/min (by C-G formula based on SCr of 0.53 mg/dL).    Allergies  Allergen Reactions  . Tequin Rash    Antimicrobials this admission: 6/15 vancomycin >>  6/15 cefepime >>      Dose adjustments this admission: ---  Microbiology results: 6/15 BCx: sent  6/5: Respiratory fungal culture negative 6/5 AFb seen on fluorochrome method. Final reports expected in 6 weeks 6/5 Sputum culture: normal flora   Thank you for allowing pharmacy to be a part of this patient's care.  Brinsley Wence 11/05/2016 1:52 PM

## 2016-11-05 NOTE — ED Notes (Signed)
Hospice nurse Amy Amalia Hailey [phone] would like to be contacted when decision made regarding admission

## 2016-11-05 NOTE — Telephone Encounter (Signed)
Hospice called and siad that pt has chosen anopther dr as her attending [phone] (yevettea).Destiny Rodriguez

## 2016-11-05 NOTE — ED Provider Notes (Signed)
[facility] DEPT Provider Note   CSN: [id number] Arrival date & time: 11/05/16  1128     History   Chief Complaint Chief Complaint  Patient presents with  . Shortness of Breath    HPI Destiny Rodriguez is a 83 y.o. female.  HPI  The patient is a 83 year old woman who presents to the emergency room for evaluation of worsening shortness of breath. Ptt has a history of COPD and MAC.  She is followed in the pulmonary clinic. Patient has been having persistent worsening symptoms associated with her chronic lung disease. She's had multiple recurrent lung infections. Her symptoms have continued to worsen. She is felt to be at end-stage lung disease. She was seen in the pulmonary office yesterday. She was given a hospice referral. This morning at home the patient felt worse. She felt more fatigued and shortness of breath. They contacted the staff at friend's home who called EMS and brought the patient to the emergency room.  Patient denies any vomiting or diarrhea. She denies any chest pain. She does understand that she is at the end stages of her life.  She is not interested in aggressive measures.  She is DNR  Past Medical History:  Diagnosis Date  . Allergic rhinitis   . Arthritis   . Bronchiectasis   . Chronic sinusitis   . COPD (chronic obstructive pulmonary disease) ([place])   . Hyperlipidemia   . MAC (mycobacterium avium-intracellulare complex)   . Migraine   . Rheumatism     Patient Active Problem List   Diagnosis Date Noted  . Protein-calorie malnutrition ([place]) 06/16/2016  . Hyponatremia 03/26/2016  . Dyspnea 02/16/2016  . Chronic respiratory failure with hypoxia ([place]) 11/27/2015  . Hemoptysis 12/07/2013  . Pneumonia 12/06/2013  . Black stool 11/23/2010  . Mycobacterium avium-intracellulare complex ([place]) 06/07/2008  . SINUSITIS, CHRONIC 06/07/2008  . ALLERGIC RHINITIS 06/07/2008  . COPD (chronic obstructive pulmonary disease) with chronic bronchitis ([place]) 06/07/2008  .  HYPERLIPIDEMIA 03/25/2007  . Bronchiectasis with (acute) exacerbation ([place]) 03/25/2007  . HIATAL HERNIA 03/25/2007    Past Surgical History:  Procedure Laterality Date  . CYSTOCELE REPAIR    . TONSILLECTOMY    . VESICOVAGINAL FISTULA CLOSURE W/ TAH      OB History    No data available       Home Medications    Prior to Admission medications   Medication Sig Start Date End Date Taking? Authorizing Provider  albuterol (PROVENTIL) (2.5 MG/3ML) 0.083% nebulizer solution Take 3 mLs (2.5 mg total) by nebulization every 6 (six) hours as needed for wheezing or shortness of breath. 08/04/16   Juanito Doom, MD  Biotin 1000 MCG tablet Take 1,000 mcg by mouth daily.    [provider]  calcium carbonate (OS-CAL) 600 MG TABS Take 600 mg by mouth 2 (two) times daily with a meal.     [provider]  chlorpheniramine (CHLOR-TRIMETON) 4 MG tablet Take 4 mg by mouth at bedtime.    [provider]  ENSURE (ENSURE) Take 237 mLs by mouth daily.    [provider]  mometasone-formoterol (DULERA) 200-5 MCG/ACT AERO Inhale 2 puffs into the lungs 2 (two) times daily. 07/21/16   Juanito Doom, MD  MULTIPLE VITAMIN PO Take 1 tablet by mouth daily.     [provider]  mupirocin cream (BACTROBAN) 2 % Apply 1 application topically 2 (two) times daily. To affected area as needed 09/18/15   Carlyle Basques, MD  OXYGEN Inhale into  the lungs at bedtime.    [provider]  polyethylene glycol (MIRALAX / GLYCOLAX) packet Take 8.5 g by mouth every other day.    [provider]  PROAIR HFA 108 (90 Base) MCG/ACT inhaler USE 2 PUFFS EVERY 4 HOURS AS NEEDED FOR WHEEZE OR SHORTNESS OF BREATHOR BAD COUGH. 03/22/16   Juanito Doom, MD  ranitidine (ZANTAC) 150 MG tablet Take 1 tablet (150 mg total) by mouth daily after supper. 08/22/15   Magdalen Spatz, NP  sodium chloride HYPERTONIC 3 % nebulizer solution Take by nebulization 2 (two) times daily.  08/04/16   Juanito Doom, MD    Family History Family History  Problem Relation Age of Onset  . Emphysema Father   . Asthma Father   . Heart disease Father   . Clotting disorder Mother   . Breast cancer Unknown        Aunt    Social History Social History  Substance Use Topics  . Smoking status: Never Smoker  . Smokeless tobacco: Never Used  . Alcohol use No     Allergies   Tequin   Review of Systems Review of Systems  All other systems reviewed and are negative.    Physical Exam Updated Vital Signs BP 128/68 (BP Location: Right Arm)   Pulse (!) 128   Temp (!) 100.7 F (38.2 C) (Oral)   Resp (!) 29   SpO2 93%   Physical Exam  Constitutional:  Thin, frail  HENT:  Head: Normocephalic and atraumatic.  Right Ear: External ear normal.  Left Ear: External ear normal.  Eyes: Conjunctivae are normal. Right eye exhibits no discharge. Left eye exhibits no discharge. No scleral icterus.  Neck: Neck supple. No tracheal deviation present.  Cardiovascular: Regular rhythm and intact distal pulses.  Tachycardia present.   Pulmonary/Chest: No stridor. No respiratory distress. She has wheezes. She has no rales.  Accessory muscle use  Abdominal: Soft. Bowel sounds are normal. She exhibits no distension. There is no tenderness. There is no rebound and no guarding.  Musculoskeletal: She exhibits no edema or tenderness.  Neurological: She is alert. No cranial nerve deficit (no facial droop, extraocular movements intact, no slurred speech) or sensory deficit. She exhibits normal muscle tone. She displays no seizure activity. Coordination normal.  Equal movements of all 4 extremities  Skin: Skin is warm and dry. No rash noted.  Psychiatric: She has a normal mood and affect.  Nursing note and vitals reviewed.    ED Treatments / Results  Labs (all labs ordered are listed, but only abnormal results are displayed) Labs Reviewed  CBC - Abnormal; Notable for the following:         Result Value   WBC 20.3 (*)    All other components within normal limits  COMPREHENSIVE METABOLIC PANEL - Abnormal; Notable for the following:    Sodium 133 (*)    Chloride 94 (*)    Glucose, Bld 162 (*)    Albumin 2.4 (*)    Alkaline Phosphatase 131 (*)    All other components within normal limits  CULTURE, BLOOD (ROUTINE X 2)  CULTURE, BLOOD (ROUTINE X 2)  I-STAT TROPOININ, ED    EKG  EKG Interpretation  Date/Time:  Friday November 05 2016 13:45:37 EDT Ventricular Rate:  153 PR Interval:    QRS Duration: 85 QT Interval:  274 QTC Calculation: 438 R Axis:   76 Text Interpretation:  Atrial fibrillation with rapid V-rate  vs MAT RSR' in V1  or V2, right VCD or RVH Probable LVH with secondary repol abnrm ST depression, probably rate related Since last tracing rate faster Confirmed by Dorie Rank [phone]) on 11/05/2016 1:49:36 PM       Radiology Dg Chest Port 1 View  Result Date: 11/05/2016 CLINICAL DATA:  Shortness of breath EXAM: PORTABLE CHEST 1 VIEW COMPARISON:  10/25/2016 FINDINGS: There is advanced chronic lung disease with emphysema and chronic cavities. Bronchiectasis and airway thickening. New airspace opacity at the bases, presumably pneumonia. No effusion or pneumothorax. Stable borderline cardiomegaly. IMPRESSION: Pneumonia at the bases. Background chronic lung disease with bronchiectasis, scarring, and chronic cavities. Electronically Signed   By: Monte Fantasia M.D.   On: 11/05/2016 13:12    Procedures Procedures (including critical care time)  Medications Ordered in ED Medications  sodium chloride 0.9 % bolus 1,000 mL (1,000 mLs Intravenous New Bag/Given 11/05/16 1254)    Followed by  0.9 %  sodium chloride infusion (not administered)  ceFEPIme (MAXIPIME) 2 g in dextrose 5 % 50 mL IVPB (not administered)  vancomycin (VANCOCIN) IVPB 1000 mg/200 mL premix (not administered)  diltiazem (CARDIZEM) 1 mg/mL load via infusion 10 mg (not administered)    And   diltiazem (CARDIZEM) 100 mg in dextrose 5% 17m (1 mg/mL) infusion (not administered)  ceFEPIme (MAXIPIME) 1 g in dextrose 5 % 50 mL IVPB (not administered)  vancomycin (VANCOCIN) 500 mg in sodium chloride 0.9 % 100 mL IVPB (not administered)  acetaminophen (TYLENOL) solution 650 mg (650 mg Oral Given 11/05/16 1156)     Initial Impression / Assessment and Plan / ED Course  I have reviewed the triage vital signs and the nursing notes.  Pertinent labs & imaging results that were available during my care of the patient were reviewed by me and considered in my medical decision making (see chart for details).  Clinical Course as of Nov 05 1349  Fri Nov 05, 2016  1235 Discussed with palliative care.  Will try to come down and see her.  Would recommend contacting hospice of [place] as well through case management.  [[XJ]  1552Pt was assessed by hospice nurse in the ED.  [[CE]  0223CXR does show pneumonia at the bases.   WBC is elevated.    [[VK]  1224Broad spectrum abx ordered.  BP remains stable.  Pt is palliative care hospice.   Will avoid aggressive measures considering her DNR, hospice hopes, palliative care treatment.  Will admit for IV abx, further treatment.  Ultimately plan on hospice care.  [[SL]  7530Now in a fib RVR.   Rate 153.  Will start on cardizem drip  [JK]    Clinical Course User Index [JK] KDorie Rank MD   Patient presents to emergency room with fever and worsening shortness of breath. She has history of severe end-stage COPD. Patient was assessed by hospice in the emergency room. They will be able to assist for outpatient management. I think the patient would benefit from brief hospitalization for antibiotics and further medical stabilization. Patient understands that unfortunately she is at the end of her life because of her chronic lung disease. SHe is not interested in any aggressive measures but does want treatment to help her feel more comfortable.  Final Clinical  Impressions(s) / ED Diagnoses   Final diagnoses:  Community acquired pneumonia, unspecified laterality  Atrial fibrillation with rapid ventricular response ([place]      KDorie Rank MD 11/05/16 1351

## 2016-11-06 DIAGNOSIS — J189 Pneumonia, unspecified organism: Secondary | ICD-10-CM | POA: Diagnosis not present

## 2016-11-06 DIAGNOSIS — J449 Chronic obstructive pulmonary disease, unspecified: Secondary | ICD-10-CM

## 2016-11-06 DIAGNOSIS — R0602 Shortness of breath: Secondary | ICD-10-CM | POA: Diagnosis not present

## 2016-11-06 DIAGNOSIS — I4891 Unspecified atrial fibrillation: Secondary | ICD-10-CM | POA: Diagnosis not present

## 2016-11-06 DIAGNOSIS — Z515 Encounter for palliative care: Secondary | ICD-10-CM | POA: Diagnosis not present

## 2016-11-06 DIAGNOSIS — Z7189 Other specified counseling: Secondary | ICD-10-CM | POA: Diagnosis not present

## 2016-11-06 LAB — BASIC METABOLIC PANEL
Anion gap: 7 (ref 5–15)
BUN: 17 mg/dL (ref 6–20)
CALCIUM: 9 mg/dL (ref 8.9–10.3)
CO2: 29 mmol/L (ref 22–32)
CREATININE: 0.55 mg/dL (ref 0.44–1.00)
Chloride: 100 mmol/L — ABNORMAL LOW (ref 101–111)
GFR calc non Af Amer: >60 mL/min (ref >60)
Glucose, Bld: 146 mg/dL — ABNORMAL HIGH (ref 65–99)
Potassium: 4.5 mmol/L (ref 3.5–5.1)
SODIUM: 136 mmol/L (ref 135–145)

## 2016-11-06 LAB — CBC
HCT: 35.4 % — ABNORMAL LOW (ref 36.0–46.0)
Hemoglobin: 11.5 g/dL — ABNORMAL LOW (ref 12.0–15.0)
MCH: 29.8 pg (ref 26.0–34.0)
MCHC: 32.5 g/dL (ref 30.0–36.0)
MCV: 91.7 fL (ref 78.0–100.0)
PLATELETS: 230 10*3/uL (ref 150–400)
RBC: 3.86 MIL/uL — AB (ref 3.87–5.11)
RDW: 14.6 % (ref 11.5–15.5)
WBC: 8.5 10*3/uL (ref 4.0–10.5)

## 2016-11-06 MED ORDER — ATROPINE SULFATE 1 % OP SOLN
3.0000 [drp] | OPHTHALMIC | Status: DC | PRN
  Filled 2016-11-06: qty 2

## 2016-11-06 MED ORDER — ATROPINE SULFATE 1 % OP SOLN: 3.0000 [drp] | OPHTHALMIC | 0 refills | Status: AC | PRN

## 2016-11-06 MED ORDER — MORPHINE SULFATE (CONCENTRATE) 10 MG/0.5ML PO SOLN: 5.0000 mg | ORAL | 0 refills | Status: AC | PRN

## 2016-11-06 MED ORDER — AMOXICILLIN-POT CLAVULANATE 500-125 MG PO TABS: 1.0000 | ORAL_TABLET | Freq: Two times a day (BID) | ORAL | 0 refills | Status: AC

## 2016-11-06 MED ORDER — LORAZEPAM 2 MG/ML PO CONC: 1.0000 mg | ORAL | 0 refills | Status: AC | PRN

## 2016-11-06 MED ORDER — LORAZEPAM 2 MG/ML PO CONC: 1.0000 mg | ORAL | Status: DC | PRN

## 2016-11-06 MED ORDER — HALOPERIDOL LACTATE 2 MG/ML PO CONC: 1.0000 mg | ORAL | 0 refills | Status: AC | PRN

## 2016-11-06 MED ORDER — MORPHINE SULFATE (CONCENTRATE) 10 MG/0.5ML PO SOLN: 5.0000 mg | ORAL | Status: DC | PRN

## 2016-11-06 MED ORDER — HALOPERIDOL LACTATE 2 MG/ML PO CONC
1.0000 mg | ORAL | Status: DC | PRN
  Filled 2016-11-06: qty 0.5

## 2016-11-06 NOTE — Care Management Obs Status (Signed)
[place] NOTIFICATION   Patient Details  Name: Destiny Rodriguez MRN: [id number] Date of Birth: Jun 16, 1924   Medicare Observation Status Notification Given:  Yes    Erenest Rasher, RN 11/06/2016, 4:55 PM

## 2016-11-06 NOTE — Discharge Summary (Signed)
Physician Discharge Summary  Destiny Rodriguez EZM:[id number] DOB: 01-22-1925 DOA: 11/05/2016  PCP: Lajean Manes, MD  Admit date: 11/05/2016 Discharge date: 11/06/2016  Admitted From:home Disposition:home with hospice  Recommendations for Outpatient Follow-up:  1. Follow up with PCP in 1-2 weeks 2. Please obtain BMP/CBC in one week   Home Health:hospice Equipment/Devices:no Discharge Condition:stable/hospice CODE STATUS:dnr Diet recommendation:heart healthy  Brief/Interim Summary: 83 y.o. female with a past medical history of COPD, MAC, bronchiectasis, who was recently seen by her pulmonologist and is thought to have end-stage lung disease. She was being transitioned to hospice at home.The chest x-ray consistent with possible basilar pneumonia. Patient was treated with ceftriaxone and azithromycin for possible commonly acquired pneumonia. Patient is allergic to fluoroquinolones. Clinically improved. Discharging with oral augmentin for 5 days. Patient was also found to have paroxysmal atrial fibrillation with RVR. It was probably in the setting of fever and pneumonia. Started on low-dose Cardizem however patient's blood pressure drops. Heart rate improved. Planning to discharge with oral antibiotics and recommended to monitor heart rate and blood pressure. Patient was also evaluated by palliative care services who recommended multiple hospice medications on discharge. Prescription for morphine, Haldol and other medication provided the patient with transition to home hospice. I discussed with patient and her daughter at bedside, patient is clinically improved. Denied chest pain, shortness of breath. Oxygen saturation acceptable in 2-3 L of oxygen. I think she is clinically stable to go home with hospice with outpatient follow-up.  Discharge Diagnoses:  Principal Problem:   CAP (community acquired pneumonia) Active Problems:   COPD (chronic obstructive pulmonary disease) with chronic bronchitis  (HCC)   Atrial fibrillation with RVR [facility])    Discharge Instructions  Discharge Instructions    Call MD for:  difficulty breathing, headache or visual disturbances    Complete by:  As directed    Call MD for:  extreme fatigue    Complete by:  As directed    Call MD for:  hives    Complete by:  As directed    Call MD for:  persistant dizziness or light-headedness    Complete by:  As directed    Call MD for:  persistant nausea and vomiting    Complete by:  As directed    Call MD for:  severe uncontrolled pain    Complete by:  As directed    Call MD for:  temperature >100.4    Complete by:  As directed    Diet - low sodium heart healthy    Complete by:  As directed    Increase activity slowly    Complete by:  As directed      Allergies as of 11/06/2016      Reactions   Tequin Rash      Medication List    TAKE these medications   amoxicillin-clavulanate 500-125 MG tablet Commonly known as:  AUGMENTIN Take 1 tablet (500 mg total) by mouth 2 (two) times daily.   atropine 1 % ophthalmic solution Place 3 drops under the tongue every 4 (four) hours as needed (excessive secretions).   Biotin 1000 MCG tablet Take 1,000 mcg by mouth daily.   calcium carbonate 600 MG Tabs tablet Commonly known as:  OS-CAL Take 600 mg by mouth 2 (two) times daily with a meal.   CHLOR-TRIMETON 4 MG tablet Generic drug:  chlorpheniramine Take 4 mg by mouth at bedtime.   ENSURE Take 118 mLs by mouth daily.   haloperidol 2 MG/ML solution Commonly known as:  HALDOL Take 0.5 mLs (1 mg total) by mouth every 4 (four) hours as needed for agitation (or nausea).   LORazepam 2 MG/ML concentrated solution Commonly known as:  ATIVAN Take 0.5 mLs (1 mg total) by mouth every 4 (four) hours as needed for anxiety.   mometasone-formoterol 200-5 MCG/ACT Aero Commonly known as:  DULERA Inhale 2 puffs into the lungs 2 (two) times daily.   morphine CONCENTRATE 10 MG/0.5ML Soln concentrated  solution Take 0.25 mLs (5 mg total) by mouth every hour as needed for severe pain or shortness of breath.   multivitamin with minerals Tabs tablet Take 1 tablet by mouth daily.   OXYGEN Inhale 2-3 L into the lungs continuous. Uses 3 Liters during the day and 2 liters at night   PROAIR HFA 108 (90 Base) MCG/ACT inhaler Generic drug:  albuterol USE 2 PUFFS EVERY 4 HOURS AS NEEDED FOR WHEEZE OR SHORTNESS OF BREATHOR BAD COUGH. What changed:  Another medication with the same name was changed. Make sure you understand how and when to take each.   albuterol (2.5 MG/3ML) 0.083% nebulizer solution Commonly known as:  PROVENTIL Take 3 mLs (2.5 mg total) by nebulization every 6 (six) hours as needed for wheezing or shortness of breath. What changed:  when to take this   ranitidine 150 MG tablet Commonly known as:  ZANTAC Take 1 tablet (150 mg total) by mouth daily after supper.   sodium chloride HYPERTONIC 3 % nebulizer solution Take by nebulization 2 (two) times daily. What changed:  how much to take      Follow-up Information    Stoneking, Hal, MD. Schedule an appointment as soon as possible for a visit in 1 week(s).   Specialty:  Internal Medicine Contact information: [address] [place] [address] [phone]        Carlyle Basques, MD .   Specialty:  Infectious Diseases Contact information: [place] [address] [phone]        Juanito Doom, MD. Schedule an appointment as soon as possible for a visit in 1 week(s).   Specialty:  Pulmonary Disease Contact information: [place] [address] [phone]          Allergies  Allergen Reactions  . Tequin Rash    Consultations: Palliative care service  Procedures/Studies: None  Subjective: Seen and examined at bedside. Reported better. Denied headache, dizziness, nausea, vomiting, chest pain or shortness of breath. Daughter at  bedside.  Discharge Exam: Vitals:   11/05/16 2200 11/06/16 0538  BP: (!) 100/44 (!) 99/47  Pulse: 79 69  Resp: 18 18  Temp: 98.8 F (37.1 C) 98 F (36.7 C)   Vitals:   11/05/16 1800 11/05/16 2115 11/05/16 2200 11/06/16 0538  BP: (!) 92/43  (!) 100/44 (!) 99/47  Pulse: 74  79 69  Resp: _0 Temp:   98.8 F (37.1 C) 98 F (36.7 C)  TempSrc:   Oral Oral  SpO2: 95% 93% 97% 99%  Weight: 40.8 kg (89 lb 15.2 oz)     Height: _1  (1.626 m)       General: Pleasant elderly female, thin looking lying in bed Cardiovascular: RRR, S1/S2 +, no rubs, no gallops Respiratory: Bibasal decreased breath sound, no wheezing. Abdominal: Soft, NT, ND, bowel sounds + Extremities: no edema, no cyanosis    The results of significant diagnostics from this hospitalization (including imaging, microbiology, ancillary and laboratory) are listed below  for reference.     Microbiology: Recent Results (from the past 240 hour(s))  Blood culture (routine x 2)     Status: None (Preliminary result)   Collection Time: 11/05/16 12:10 PM  Result Value Ref Range Status   Specimen Description BLOOD RIGHT FOREARM  Final   Special Requests   Final    BOTTLES DRAWN AEROBIC AND ANAEROBIC Blood Culture adequate volume   Culture   Final    NO GROWTH < 24 HOURS Performed at [facility], [address] [place], [place] [address]    Report Status PENDING  Incomplete  Blood culture (routine x 2)     Status: None (Preliminary result)   Collection Time: 11/05/16  1:00 PM  Result Value Ref Range Status   Specimen Description BLOOD RIGHT FOREARM  Final   Special Requests   Final    BOTTLES DRAWN AEROBIC AND ANAEROBIC Blood Culture adequate volume   Culture   Final    NO GROWTH < 24 HOURS Performed at [facility], [place] [address] [place], [place] [address]    Report Status PENDING  Incomplete     Labs: BNP (last 3 results) No results for input(s): BNP in the last 8760 hours. Basic  Metabolic Panel:  Recent Labs Lab 11/01/16 1252 11/05/16 1223 11/06/16 0456  NA 133* 133* 136  K 4.5 4.8 4.5  CL 96 94* 100*  CO2 _0 GLUCOSE 117* 162* 146*  BUN _1 CREATININE 0.63 0.53 0.55  CALCIUM 9.3 9.5 9.0   Liver Function Tests:  Recent Labs Lab 11/05/16 1223  AST 31  ALT 18  ALKPHOS 131*  BILITOT 1.1  PROT 6.7  ALBUMIN 2.4*   No results for input(s): LIPASE, AMYLASE in the last 168 hours. No results for input(s): AMMONIA in the last 168 hours. CBC:  Recent Labs Lab 11/01/16 1252 11/05/16 1223 11/06/16 0456  WBC 10.3 20.3* 8.5  NEUTROABS 8.4*  --   --   HGB 13.5 13.9 11.5*  HCT 40.8 42.0 35.4*  MCV 92.8 91.9 91.7  PLT 268.0 271 230   Cardiac Enzymes: No results for input(s): CKTOTAL, CKMB, CKMBINDEX, TROPONINI in the last 168 hours. BNP: Invalid input(s): POCBNP CBG: No results for input(s): GLUCAP in the last 168 hours. D-Dimer No results for input(s): DDIMER in the last 72 hours. Hgb A1c No results for input(s): HGBA1C in the last 72 hours. Lipid Profile No results for input(s): CHOL, HDL, LDLCALC, TRIG, CHOLHDL, LDLDIRECT in the last 72 hours. Thyroid function studies No results for input(s): TSH, T4TOTAL, T3FREE, THYROIDAB in the last 72 hours.  Invalid input(s): FREET3 Anemia work up No results for input(s): VITAMINB12, FOLATE, FERRITIN, TIBC, IRON, RETICCTPCT in the last 72 hours. Urinalysis No results found for: COLORURINE, APPEARANCEUR, [place], [place], [place], [place], [place], [place], PROTEINUR, UROBILINOGEN, NITRITE, LEUKOCYTESUR Sepsis Labs Invalid input(s): PROCALCITONIN,  WBC,  LACTICIDVEN Microbiology Recent Results (from the past 240 hour(s))  Blood culture (routine x 2)     Status: None (Preliminary result)   Collection Time: 11/05/16 12:10 PM  Result Value Ref Range Status   Specimen Description BLOOD RIGHT FOREARM  Final   Special Requests   Final    BOTTLES DRAWN AEROBIC AND ANAEROBIC Blood  Culture adequate volume   Culture   Final    NO GROWTH < 24 HOURS Performed at [facility], [address] [place], [place] [address]    Report Status PENDING  Incomplete  Blood culture (routine x 2)     Status: None (Preliminary result)   Collection Time: 11/05/16  1:00 PM  Result Value Ref Range Status   Specimen Description BLOOD RIGHT FOREARM  Final   Special Requests   Final    BOTTLES DRAWN AEROBIC AND ANAEROBIC Blood Culture adequate volume   Culture   Final    NO GROWTH < 24 HOURS Performed at [facility], [place] [address] [place], [place] [address]    Report Status PENDING  Incomplete     Time coordinating discharge: 35 minutes  SIGNED:   Rosita Fire, MD  [facility] 11/06/2016, 12:30 PM  If 7PM-7AM, please contact night-coverage [email or web address] Password [email or web address]

## 2016-11-06 NOTE — Care Management Note (Signed)
Case Management Note  Patient Details  Name: Destiny Rodriguez MRN: [id number] Date of Birth: 18-Feb-1925  Subjective/Objective:  Shortness of breath                  Action/Plan: Discharge Planning: Please see previous NCM notes. Pt was arranged with [facility]. Contacted Hospice Liaison and they have appt to see her on tomorrow at 3 pm. Pt has oxygen at home. Husband has portable to use at dc. Contacted [facility] to make aware of dc home today.   PCP Lajean Manes   Expected Discharge Date:  11/06/16               Expected Discharge Plan:  Home w Hospice Care  In-House Referral:  NA  Discharge planning Services  CM Consult  Post Acute Care Choice:  Hospice, Resumption of Svcs/PTA Provider Choice offered to:  Adult Children  DME Arranged:  N/A DME Agency:  [facility]  HH Arranged:  RN [facility] Agency:  [facility] [address]  Status of Service:  Completed, signed off  If discussed at [place] of Stay Meetings, dates discussed:    Additional Comments:  Erenest Rasher, RN 11/06/2016, 3:21 PM

## 2016-11-06 NOTE — Progress Notes (Signed)
Palliative care progress note  Reason for consult: goals of care and symptom management (dyspnea)  I met with Destiny Rodriguez this AM.  She reports having a good night overnight.  Reports that she is enjoying a little breakfast (not much gone at this point) and hoping that she is able to go home later today.  No family present at bedside.  I did give my card to daughter yesterday and asked her to call if any questions.  Physical Exam General: Alert, awake, in no acute distress. Frail and elderly. HEENT: No bruits, no goiter, no JVD Heart: Regular rate and rhythm. No murmur appreciated. Lungs: Diminished air movement Abdomen: Soft, nontender, nondistended, positive bowel sounds.  Ext: No significant edema Skin: Warm and dry Neuro: Grossly intact, nonfocal.  Plan: - Home with hospice when medically stable (she is hopeful as soon as today) - On discharge, in addition to other medications, would recommend scripts for: - Morphine Concentrate 16m/0.5ml: 581m(0.2524msublingual every 1 hour as needed for pain or shortness of breath: Disp 83m48mLorazepam 2mg/40mconcentrated solution: 1mg (10mml) s73mingual every 4 hours as needed for anxiety: Disp 83ml - 47mol 2mg/ml s11mtion: 0.5mg (0.2559m subl68mal every 4 hours as needed for agitation or nausea: Disp 83ml  - Atr50me opthalmic 1% solution: 3 drops sublingual every 4 hours as needed: Disp 2ml  Total t91m: 20 minutes Greater than 50%  of this time was spent counseling and coordinating care related to the above assessment and plan.  Destiny Rodriguez,Micheline [phone]

## 2016-11-07 DIAGNOSIS — K219 Gastro-esophageal reflux disease without esophagitis: Secondary | ICD-10-CM | POA: Diagnosis not present

## 2016-11-07 DIAGNOSIS — J449 Chronic obstructive pulmonary disease, unspecified: Secondary | ICD-10-CM | POA: Diagnosis not present

## 2016-11-07 DIAGNOSIS — E46 Unspecified protein-calorie malnutrition: Secondary | ICD-10-CM | POA: Diagnosis not present

## 2016-11-07 DIAGNOSIS — I4891 Unspecified atrial fibrillation: Secondary | ICD-10-CM | POA: Diagnosis not present

## 2016-11-07 DIAGNOSIS — R0902 Hypoxemia: Secondary | ICD-10-CM | POA: Diagnosis not present

## 2016-11-07 DIAGNOSIS — A310 Pulmonary mycobacterial infection: Secondary | ICD-10-CM | POA: Diagnosis not present

## 2016-11-07 DIAGNOSIS — J479 Bronchiectasis, uncomplicated: Secondary | ICD-10-CM | POA: Diagnosis not present

## 2016-11-07 DIAGNOSIS — J309 Allergic rhinitis, unspecified: Secondary | ICD-10-CM | POA: Diagnosis not present

## 2016-11-07 DIAGNOSIS — F411 Generalized anxiety disorder: Secondary | ICD-10-CM | POA: Diagnosis not present

## 2016-11-08 DIAGNOSIS — R0902 Hypoxemia: Secondary | ICD-10-CM | POA: Diagnosis not present

## 2016-11-08 DIAGNOSIS — J449 Chronic obstructive pulmonary disease, unspecified: Secondary | ICD-10-CM | POA: Diagnosis not present

## 2016-11-08 DIAGNOSIS — I4891 Unspecified atrial fibrillation: Secondary | ICD-10-CM | POA: Diagnosis not present

## 2016-11-08 DIAGNOSIS — E46 Unspecified protein-calorie malnutrition: Secondary | ICD-10-CM | POA: Diagnosis not present

## 2016-11-08 DIAGNOSIS — A310 Pulmonary mycobacterial infection: Secondary | ICD-10-CM | POA: Diagnosis not present

## 2016-11-08 DIAGNOSIS — J479 Bronchiectasis, uncomplicated: Secondary | ICD-10-CM | POA: Diagnosis not present

## 2016-11-09 ENCOUNTER — Telehealth: Payer: Self-pay | Admitting: Acute Care

## 2016-11-09 ENCOUNTER — Non-Acute Institutional Stay (SKILLED_NURSING_FACILITY): Payer: Medicare Other | Admitting: Internal Medicine

## 2016-11-09 ENCOUNTER — Encounter: Payer: Self-pay | Admitting: Internal Medicine

## 2016-11-09 DIAGNOSIS — J309 Allergic rhinitis, unspecified: Secondary | ICD-10-CM | POA: Diagnosis not present

## 2016-11-09 DIAGNOSIS — J9621 Acute and chronic respiratory failure with hypoxia: Secondary | ICD-10-CM

## 2016-11-09 DIAGNOSIS — E46 Unspecified protein-calorie malnutrition: Secondary | ICD-10-CM | POA: Diagnosis not present

## 2016-11-09 DIAGNOSIS — I4891 Unspecified atrial fibrillation: Secondary | ICD-10-CM | POA: Diagnosis not present

## 2016-11-09 DIAGNOSIS — F419 Anxiety disorder, unspecified: Secondary | ICD-10-CM

## 2016-11-09 DIAGNOSIS — L89151 Pressure ulcer of sacral region, stage 1: Secondary | ICD-10-CM

## 2016-11-09 DIAGNOSIS — R5381 Other malaise: Secondary | ICD-10-CM

## 2016-11-09 DIAGNOSIS — E43 Unspecified severe protein-calorie malnutrition: Secondary | ICD-10-CM

## 2016-11-09 DIAGNOSIS — J449 Chronic obstructive pulmonary disease, unspecified: Secondary | ICD-10-CM

## 2016-11-09 DIAGNOSIS — A310 Pulmonary mycobacterial infection: Secondary | ICD-10-CM | POA: Diagnosis not present

## 2016-11-09 DIAGNOSIS — J189 Pneumonia, unspecified organism: Secondary | ICD-10-CM | POA: Diagnosis not present

## 2016-11-09 DIAGNOSIS — J479 Bronchiectasis, uncomplicated: Secondary | ICD-10-CM | POA: Diagnosis not present

## 2016-11-09 DIAGNOSIS — R0902 Hypoxemia: Secondary | ICD-10-CM | POA: Diagnosis not present

## 2016-11-09 NOTE — Progress Notes (Signed)
LOCATION: [facility]  PCP: Lajean Manes, MD   Code Status: DNR  Goals of care: Advanced Directive information Advanced Directives 11/09/2016  Does Patient Have a Medical Advance Directive? Yes  Type of Advance Directive Out of facility DNR (pink MOST or yellow form)  Does patient want to make changes to medical advance directive? No - Patient declined  Copy of [place] in Chart? -       Extended Emergency Contact Information Primary Emergency Contact: Seider,Joseph T Address: [address]          [place], [place] [address] [place] Phone: [phone] Relation: Spouse   Allergies  Allergen Reactions  . Tequin Rash    Chief Complaint  Patient presents with  . New Admit To SNF    New Admission Visit      HPI:  Patient is a 83 y.o. female seen today for short term rehabilitation post hospital admission from 11/05/16-11/06/16 with acute on chronic respiratory failure with HCAP. She also had afibv with RVR. She was placed on iv antibiotic and cardizem. Her BP dropped and cardizem was discontinued. She was switched to oral antibiotic and discharged home for comfort care. She is now transferred from independent living center to SNF due to increased requirement of care level. Her husband had been taking care of her at home. She has medical history of COPD, bronchieactasis, end stage lung disease among others. She is seen in her room today with her husband at bedside.   Review of Systems:  Constitutional: Negative for chills, diaphoresis. Feels weak and tired.  HENT: Negative for headache, congestion, nasal discharge, sore throat. Positive for difficulty swallowing solid food especially hard and chewy food. Positive for Post nasal drip, Dry mouth and throat.   Eyes: Negative for eye pain and discharge. Positive for occasional blurred vision and double vision. Wears glasses.  Respiratory: Positive for cough with  yellowish phlegm and shortness of breath in between sentences and even at rest.   Cardiovascular: Negative for chest pain, palpitations, leg swelling.  Gastrointestinal: Negative for heartburn, nausea, vomiting, abdominal pain. Appetite has been poor. She had a bowel movement this am. Denies diarrhea.  Genitourinary: Negative for dysuria and flank pain. Positive for occasional stress incontinence, otherwise continent of bladder.  Musculoskeletal: Negative for fall in the facility. positive for pain to low back and buttock area.  Skin: Negative for itching, rash.  Neurological: Negative for dizziness. Psychiatric/Behavioral: Negative for depression. Positive for anxiety.    Past Medical History:  Diagnosis Date  . Allergic rhinitis   . Arthritis   . Bronchiectasis   . Chronic sinusitis   . COPD (chronic obstructive pulmonary disease) ([place])   . Hyperlipidemia   . MAC (mycobacterium avium-intracellulare complex)   . Migraine   . Rheumatism    Past Surgical History:  Procedure Laterality Date  . CYSTOCELE REPAIR    . TONSILLECTOMY    . VESICOVAGINAL FISTULA CLOSURE W/ TAH     Social History:   reports that she has never smoked. She has never used smokeless tobacco. She reports that she does not drink alcohol or use drugs.  Family History  Problem Relation Age of Onset  . Emphysema Father   . Asthma Father   . Heart disease Father   . Clotting disorder Mother   . Breast cancer Unknown        Aunt    Medications: Allergies as of 11/09/2016  Reactions   Tequin Rash      Medication List       Accurate as of 11/09/16  4:04 PM. Always use your most recent med list.          amoxicillin-clavulanate 500-125 MG tablet Commonly known as:  AUGMENTIN Take 1 tablet (500 mg total) by mouth 2 (two) times daily.   atropine 1 % ophthalmic solution Place 3 drops under the tongue every 4 (four) hours as needed (excessive secretions).   Biotin 1000 MCG tablet Take 1,000 mcg  by mouth daily.   calcium carbonate 600 MG Tabs tablet Commonly known as:  OS-CAL Take 600 mg by mouth 2 (two) times daily with a meal.   CHLOR-TRIMETON 4 MG tablet Generic drug:  chlorpheniramine Take 4 mg by mouth at bedtime.   haloperidol 2 MG/ML solution Commonly known as:  HALDOL Take 0.5 mLs (1 mg total) by mouth every 4 (four) hours as needed for agitation (or nausea).   lactose free nutrition Liqd Take 237 mLs by mouth daily.   LORazepam 2 MG/ML concentrated solution Commonly known as:  ATIVAN Take 0.5 mLs (1 mg total) by mouth every 4 (four) hours as needed for anxiety.   mometasone-formoterol 200-5 MCG/ACT Aero Commonly known as:  DULERA Inhale 2 puffs into the lungs 2 (two) times daily.   morphine CONCENTRATE 10 MG/0.5ML Soln concentrated solution Take 0.25 mLs (5 mg total) by mouth every hour as needed for severe pain or shortness of breath.   multivitamin with minerals Tabs tablet Take 1 tablet by mouth daily.   OXYGEN Inhale 3 L into the lungs continuous.   albuterol (2.5 MG/3ML) 0.083% nebulizer solution Commonly known as:  PROVENTIL Take 2.5 mg by nebulization every 6 (six) hours as needed for wheezing or shortness of breath. Can also take 2 times daily   PROAIR HFA 108 (90 Base) MCG/ACT inhaler Generic drug:  albuterol USE 2 PUFFS EVERY 4 HOURS AS NEEDED FOR WHEEZE OR SHORTNESS OF BREATHOR BAD COUGH.   ranitidine 150 MG tablet Commonly known as:  ZANTAC Take 1 tablet (150 mg total) by mouth daily after supper.   sodium chloride HYPERTONIC 3 % nebulizer solution Take by nebulization 2 (two) times daily.       Immunizations: Immunization History  Administered Date(s) Administered  . Influenza Split 02/12/2011, 02/07/2012, 02/15/2015  . Influenza Whole 03/17/2005, 03/24/2008, 02/21/2009, 02/25/2010  . Influenza, High Dose Seasonal PF 02/16/2016  . Influenza,inj,Quad PF,36+ Mos 02/21/2013  . Influenza-Unspecified 02/06/2014  . Pneumococcal  Conjugate-13 01/11/2014  . Pneumococcal Polysaccharide-23 05/24/2004, 05/24/2010  . Tdap 09/05/2012  . Zoster 08/25/2007     Physical Exam: Vitals:   11/09/16 1545  BP: 126/74  Pulse: (!) 102  Resp: (!) 22  Temp: (!) 103 F (39.4 C)  TempSrc: Oral  Weight: 89 lb 14.4 oz (40.8 kg)  Height: _0  (1.626 m)   Body mass index is 15.43 kg/m.  General- elderly female, frail and thin built, in no acute distress Head- normocephalic, atraumatic Nose- no maxillary or frontal sinus tenderness, no nasal discharge Throat- moist mucus membrane, normal oropharynx, no mouth sores Eyes- PERRLA, EOMI, no pallor, no icterus, no discharge, normal conjunctiva, normal sclera Neck- no cervical lymphadenopathy Cardiovascular- tachycardic, + systolic murmur Respiratory- bilateral poor air movement, + wheeze, + rhonchi, using her accessory muscles, SOB in between sentences, on 3 liter o2 by nasal canula  Abdomen- bowel sounds present, soft, non tender, no guarding or rigidity, no CVA tenderness Musculoskeletal- able to  move all 4 extremities, generalized weakness, kyphosis present, 1+ edema, distal pulses palpable Neurological- alert and oriented to person, place and time Skin- warm and dry, not clammy, non blanchable redness to her coccyx and sacrum, intact skin Psychiatry- normal mood and affect    Labs reviewed: Basic Metabolic Panel:  Recent Labs  11/01/16 1252 11/05/16 1223 11/06/16 0456  NA 133* 133* 136  K 4.5 4.8 4.5  CL 96 94* 100*  CO2 _0 GLUCOSE 117* 162* 146*  BUN _1 CREATININE 0.63 0.53 0.55  CALCIUM 9.3 9.5 9.0   Liver Function Tests:  Recent Labs  11/05/16 1223  AST 31  ALT 18  ALKPHOS 131*  BILITOT 1.1  PROT 6.7  ALBUMIN 2.4*   No results for input(s): LIPASE, AMYLASE in the last 8760 hours. No results for input(s): AMMONIA in the last 8760 hours. CBC:  Recent Labs  03/26/16 1549 11/01/16 1252 11/05/16 1223 11/06/16 0456  WBC 14.8* 10.3  20.3* 8.5  NEUTROABS 11.4* 8.4*  --   --   HGB 14.1 13.5 13.9 11.5*  HCT 42.6 40.8 42.0 35.4*  MCV 92.9 92.8 91.9 91.7  PLT 197.0 268.0 271 230   Cardiac Enzymes: No results for input(s): CKTOTAL, CKMB, CKMBINDEX, TROPONINI in the last 8760 hours. BNP: Invalid input(s): POCBNP CBG: No results for input(s): GLUCAP in the last 8760 hours.  Radiological Exams: Dg Chest 2 View  Result Date: 10/25/2016 CLINICAL DATA:  Short of breath. Bronchiectasis. Mycobacterium infection EXAM: CHEST  2 VIEW COMPARISON:  Radiograph 06/17/2016 FINDINGS: Normal cardiac silhouette. Lungs are hyperinflated. Cavitary lesions in the upper lobes are not changed from prior. No acute airspace disease identified. No pneumothorax. IMPRESSION: 1. No significant change. 2. Hyperinflated lungs with upper lobe thin wall cavitary lesions. Electronically Signed   By: Suzy Bouchard M.D.   On: 10/25/2016 11:52   Dg Chest Port 1 View  Result Date: 11/05/2016 CLINICAL DATA:  Shortness of breath EXAM: PORTABLE CHEST 1 VIEW COMPARISON:  10/25/2016 FINDINGS: There is advanced chronic lung disease with emphysema and chronic cavities. Bronchiectasis and airway thickening. New airspace opacity at the bases, presumably pneumonia. No effusion or pneumothorax. Stable borderline cardiomegaly. IMPRESSION: Pneumonia at the bases. Background chronic lung disease with bronchiectasis, scarring, and chronic cavities. Electronically Signed   By: Monte Fantasia M.D.   On: 11/05/2016 13:12    Assessment/Plan  Physical deconditioning Extremely deconditioned. Has ongoing infection and end stage lung disease. Goal is for comfort care. Hospice consult to be obtained. Discontinue biotin supplement and multivitamin. Discontinue oscal, dulera and chlortrimeton.   Acute on chronic respiratory failure With HCAP and progressive broncheiactasis. D/c dulera with difficulty to use. Change her albuterol nebulizer to tid when awake and every 4 hr prn.  Continue o2 by nasal canula. Currently on morphine 5 mg every hour as needed. Hospice consult. Continue Atropine 1% 3 drops q4h prn for secretion. D/c haldol as pt has no symptom of agitation/ restlessness. Continue lorazepam as needed for anxiety.   HCAP Continue and complete 5 days course of augmentin. Provide tylenol as needed for fever. Continue her oxygen by nasal canula and her bronchodilators. Add florastor 250 mg bid x 1 week.   Stage 1 pressure ulcer To her coccyx. Will need preventative dressing in place after cleaning the skin area, low air loss mattress and gel cushion for her chair.   afib Tachycardic but also febrile and in pain, both of these could be contributing to increased heart  rate. Regular HR on exam. monitor clinically. Off cardizem with hypotensive episode in hospital.   End stage Copd C/w oxygen and bronchodilators. Continue sodium chloride nebs to help loosen her mucus with pulmonary toileting.   Allergic rhinitis Start loratadine 10 mg daily.   Sever protein calorie malnutrition Decline anticipated, continue boost supplement, encourage po intake as tolerated.   Anxiety Mainly with her progressive dyspnea, continue ativan 1 mg q4h prn.    Goals of care: long term comfort care   Labs/tests ordered: none  Family/ staff Communication: reviewed care plan with patient, her husband and nursing supervisor    Blanchie Serve, MD Internal Medicine [address], [place] [address] Cell Phone (Monday-Friday 8 am - 5 pm): [phone] On Call: [phone] and follow prompts after 5 pm and on weekends Office Phone: [phone] Office Fax: [phone]

## 2016-11-09 NOTE — Telephone Encounter (Signed)
Left message with [facility] for them to have one of the lab techs to call back. When I called, they are already left for the day.

## 2016-11-10 DIAGNOSIS — A310 Pulmonary mycobacterial infection: Secondary | ICD-10-CM | POA: Diagnosis not present

## 2016-11-10 DIAGNOSIS — J449 Chronic obstructive pulmonary disease, unspecified: Secondary | ICD-10-CM | POA: Diagnosis not present

## 2016-11-10 DIAGNOSIS — I4891 Unspecified atrial fibrillation: Secondary | ICD-10-CM | POA: Diagnosis not present

## 2016-11-10 DIAGNOSIS — R0902 Hypoxemia: Secondary | ICD-10-CM | POA: Diagnosis not present

## 2016-11-10 DIAGNOSIS — J479 Bronchiectasis, uncomplicated: Secondary | ICD-10-CM | POA: Diagnosis not present

## 2016-11-10 DIAGNOSIS — E46 Unspecified protein-calorie malnutrition: Secondary | ICD-10-CM | POA: Diagnosis not present

## 2016-11-10 LAB — CULTURE, BLOOD (ROUTINE X 2)
CULTURE: NO GROWTH
CULTURE: NO GROWTH
SPECIAL REQUESTS: ADEQUATE
Special Requests: ADEQUATE

## 2016-11-10 NOTE — Telephone Encounter (Signed)
I had noted the report when it came across my dashboard. We will wait for the final culture result.

## 2016-11-10 NOTE — Telephone Encounter (Signed)
I have spoken with [facility], who wanted to make SG aware of abnormal AFB results (results are in epic). Culture is currently in identification process.   Will route to SG to make aware.

## 2016-11-11 DIAGNOSIS — E46 Unspecified protein-calorie malnutrition: Secondary | ICD-10-CM | POA: Diagnosis not present

## 2016-11-11 DIAGNOSIS — J449 Chronic obstructive pulmonary disease, unspecified: Secondary | ICD-10-CM | POA: Diagnosis not present

## 2016-11-11 DIAGNOSIS — I4891 Unspecified atrial fibrillation: Secondary | ICD-10-CM | POA: Diagnosis not present

## 2016-11-11 DIAGNOSIS — J479 Bronchiectasis, uncomplicated: Secondary | ICD-10-CM | POA: Diagnosis not present

## 2016-11-11 DIAGNOSIS — A310 Pulmonary mycobacterial infection: Secondary | ICD-10-CM | POA: Diagnosis not present

## 2016-11-11 DIAGNOSIS — R0902 Hypoxemia: Secondary | ICD-10-CM | POA: Diagnosis not present

## 2016-11-12 ENCOUNTER — Telehealth: Payer: Self-pay

## 2016-11-12 NOTE — Telephone Encounter (Signed)
Call report given to Clayborne Dana on patient- Preliminary AFB came back positive, many acid-fast bacilli seen. Full report available in Epic.    Sending to DOD- please advise if this needs to be addressed today, or can wait for BQ's return on Monday.  Thanks!   11/04/16 AVS with SG:  Patient Instructions    It is good to see you today. We will get a hospice referral started today. Plan Will be for care at home. [place] has DNR paperwork signed. Continue with your nebulizer treatments and chest vest and flutter valve. Continue oxygen at 2-4 L nasal cannula as needed. Oxygen saturation goals are 88-92% Coninue your zantac at night. Continue trying to eat small frequent meals. Follow up as with Dr. Lake Bells 12/14/2016 as is scheduled, however if you would like to cancel the appointment please just call and let us know Please contact office for sooner follow up if symptoms do not improve or worsen or seek emergency care

## 2016-11-12 NOTE — Telephone Encounter (Signed)
Called number listed on chart, husband states that the patient is living [facility] _admitted 11/09/16 and is under [facility]. Husband states that we need to call [phone].  Samak with [facility], results/rec's given.   Will send back to Dr [place] Bells to advise on further recommendations.

## 2016-11-12 NOTE — Telephone Encounter (Signed)
Probably not contagious but would stay in and not be exposed to any new people over the weekend that don't usually see her until we sort it out next week

## 2016-11-13 DIAGNOSIS — E46 Unspecified protein-calorie malnutrition: Secondary | ICD-10-CM | POA: Diagnosis not present

## 2016-11-13 DIAGNOSIS — J449 Chronic obstructive pulmonary disease, unspecified: Secondary | ICD-10-CM | POA: Diagnosis not present

## 2016-11-13 DIAGNOSIS — I4891 Unspecified atrial fibrillation: Secondary | ICD-10-CM | POA: Diagnosis not present

## 2016-11-13 DIAGNOSIS — J479 Bronchiectasis, uncomplicated: Secondary | ICD-10-CM | POA: Diagnosis not present

## 2016-11-13 DIAGNOSIS — R0902 Hypoxemia: Secondary | ICD-10-CM | POA: Diagnosis not present

## 2016-11-13 DIAGNOSIS — A310 Pulmonary mycobacterial infection: Secondary | ICD-10-CM | POA: Diagnosis not present

## 2016-11-14 DIAGNOSIS — A310 Pulmonary mycobacterial infection: Secondary | ICD-10-CM | POA: Diagnosis not present

## 2016-11-14 DIAGNOSIS — J449 Chronic obstructive pulmonary disease, unspecified: Secondary | ICD-10-CM | POA: Diagnosis not present

## 2016-11-14 DIAGNOSIS — I4891 Unspecified atrial fibrillation: Secondary | ICD-10-CM | POA: Diagnosis not present

## 2016-11-14 DIAGNOSIS — J479 Bronchiectasis, uncomplicated: Secondary | ICD-10-CM | POA: Diagnosis not present

## 2016-11-14 DIAGNOSIS — E46 Unspecified protein-calorie malnutrition: Secondary | ICD-10-CM | POA: Diagnosis not present

## 2016-11-14 DIAGNOSIS — R0902 Hypoxemia: Secondary | ICD-10-CM | POA: Diagnosis not present

## 2016-11-15 DIAGNOSIS — J479 Bronchiectasis, uncomplicated: Secondary | ICD-10-CM | POA: Diagnosis not present

## 2016-11-15 DIAGNOSIS — R0902 Hypoxemia: Secondary | ICD-10-CM | POA: Diagnosis not present

## 2016-11-15 DIAGNOSIS — J449 Chronic obstructive pulmonary disease, unspecified: Secondary | ICD-10-CM | POA: Diagnosis not present

## 2016-11-15 DIAGNOSIS — A310 Pulmonary mycobacterial infection: Secondary | ICD-10-CM | POA: Diagnosis not present

## 2016-11-15 DIAGNOSIS — I4891 Unspecified atrial fibrillation: Secondary | ICD-10-CM | POA: Diagnosis not present

## 2016-11-15 DIAGNOSIS — E46 Unspecified protein-calorie malnutrition: Secondary | ICD-10-CM | POA: Diagnosis not present

## 2016-11-15 NOTE — Telephone Encounter (Signed)
Nothing to do, likely MAI

## 2016-11-15 NOTE — Telephone Encounter (Signed)
I have spoken with Verdis Frederickson at living [facility] home, who is aware of below message. Nothing further needed.

## 2016-11-16 DIAGNOSIS — E46 Unspecified protein-calorie malnutrition: Secondary | ICD-10-CM | POA: Diagnosis not present

## 2016-11-16 DIAGNOSIS — J449 Chronic obstructive pulmonary disease, unspecified: Secondary | ICD-10-CM | POA: Diagnosis not present

## 2016-11-16 DIAGNOSIS — J479 Bronchiectasis, uncomplicated: Secondary | ICD-10-CM | POA: Diagnosis not present

## 2016-11-16 DIAGNOSIS — R0902 Hypoxemia: Secondary | ICD-10-CM | POA: Diagnosis not present

## 2016-11-16 DIAGNOSIS — I4891 Unspecified atrial fibrillation: Secondary | ICD-10-CM | POA: Diagnosis not present

## 2016-11-16 DIAGNOSIS — A310 Pulmonary mycobacterial infection: Secondary | ICD-10-CM | POA: Diagnosis not present

## 2016-11-21 DEATH — deceased

## 2016-11-26 LAB — FUNGUS CULTURE W SMEAR

## 2016-11-29 ENCOUNTER — Ambulatory Visit: Payer: Medicare Other | Admitting: Internal Medicine

## 2016-12-14 ENCOUNTER — Ambulatory Visit: Payer: Medicare Other | Admitting: Pulmonary Disease

## 2016-12-16 LAB — AFB CULTURE WITH SMEAR (NOT AT ARMC)

## 2018-06-11 IMAGING — DX DG CHEST 1V PORT
1 series · 1 of 1 positions shown · non-contrast
Comparison: 10/25/2016

CLINICAL DATA: Shortness of breath

EXAM:
PORTABLE CHEST 1 VIEW

[chest ap]
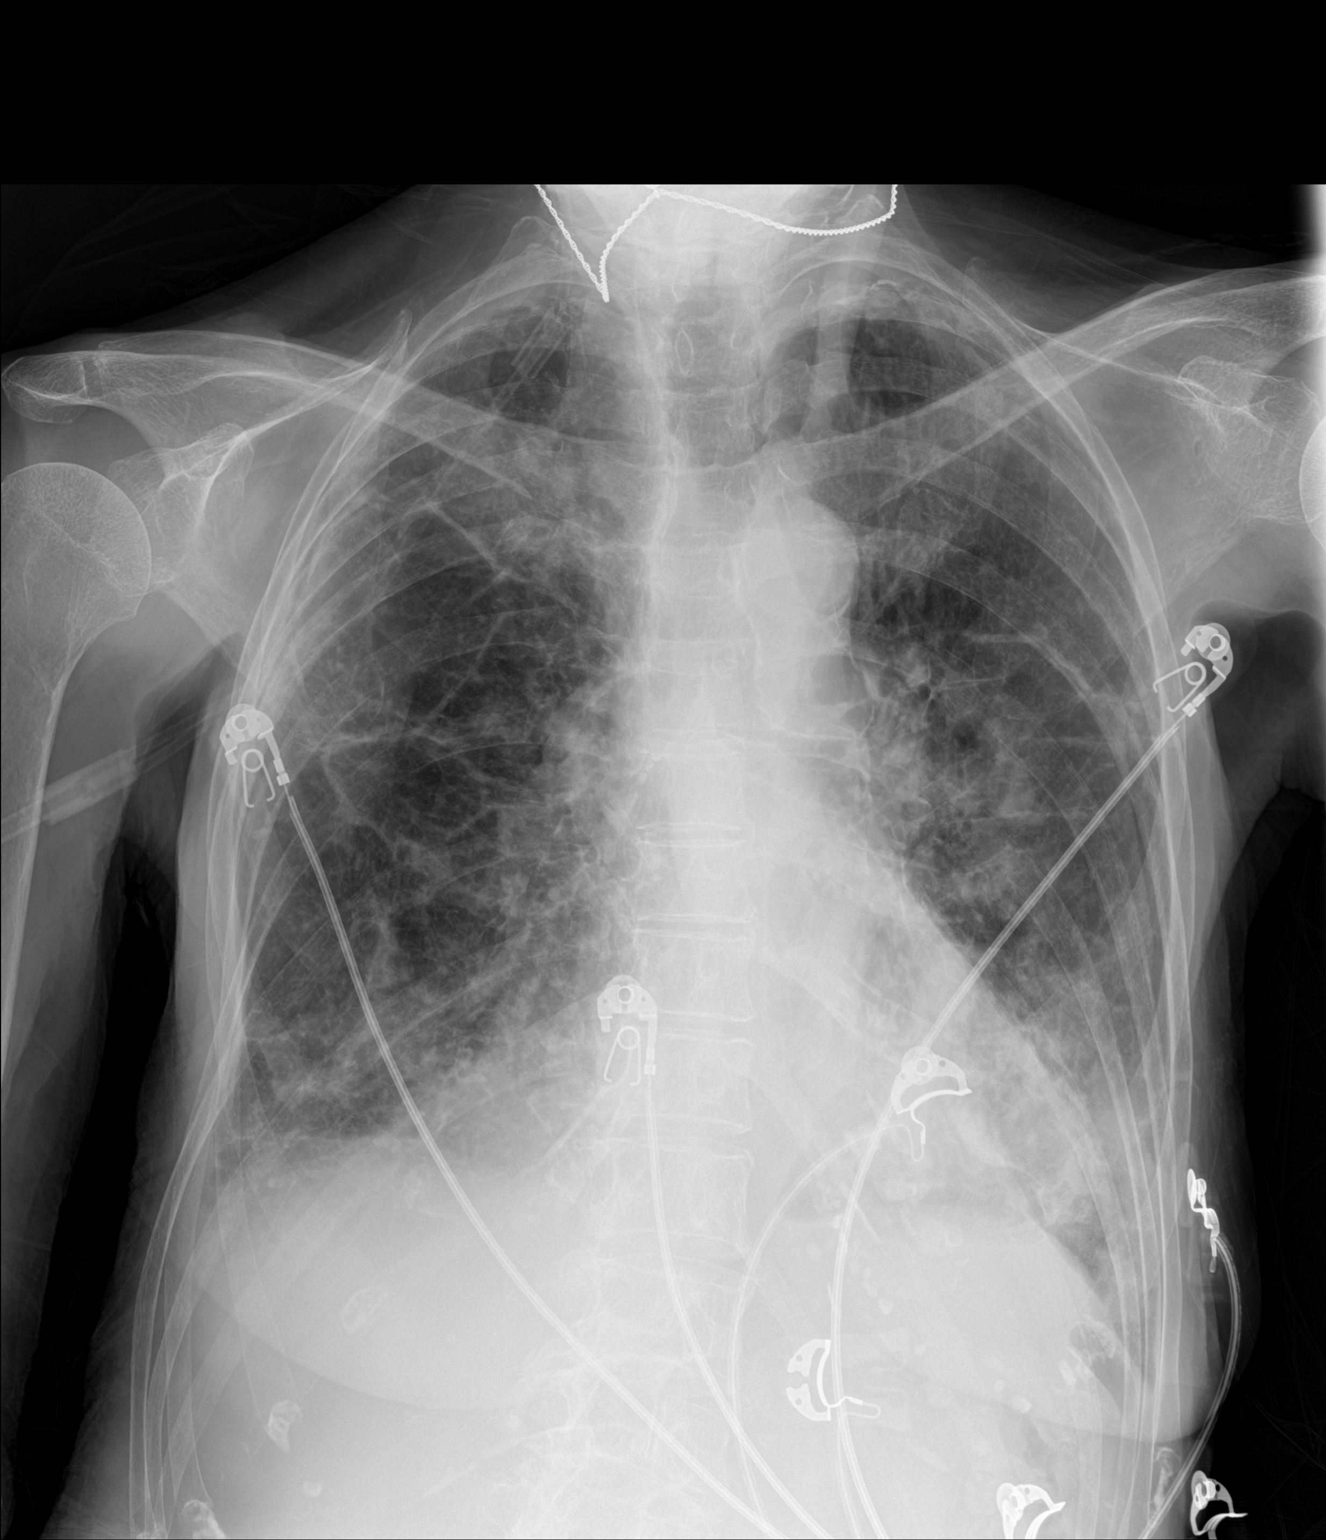

[1 of 1 positions shown; findings below may reference images not displayed]

FINDINGS: There is advanced chronic lung disease with emphysema and chronic
cavities. Bronchiectasis and airway thickening. New airspace opacity
at the bases, presumably pneumonia. No effusion or pneumothorax.
Stable borderline cardiomegaly.
IMPRESSION: Pneumonia at the bases.

Background chronic lung disease with bronchiectasis, scarring, and
chronic cavities.
